# Patient Record
Sex: Female | Born: 1980 | Race: Black or African American | Hispanic: No | Marital: Single | Smoking: Current some day smoker
Health system: Southern US, Community
[De-identification: ages and names within clinical notes are randomized; demographics above are authoritative.]

## PROBLEM LIST (undated history)

## (undated) ENCOUNTER — Emergency Department (HOSPITAL_COMMUNITY): Discharge status: Absent without leave | Payer: Self-pay

## (undated) DIAGNOSIS — G629 Polyneuropathy, unspecified: Secondary | ICD-10-CM

## (undated) DIAGNOSIS — I2699 Other pulmonary embolism without acute cor pulmonale: Secondary | ICD-10-CM

## (undated) HISTORY — PX: TONSILLECTOMY: SUR1361

## (undated) HISTORY — PX: GASTRIC BYPASS: SHX52

---
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
## 2001-11-04 ENCOUNTER — Emergency Department (HOSPITAL_COMMUNITY): Admission: EM | Admit: 2001-11-04 | Discharge: 2001-11-04 | Payer: Self-pay | Admitting: Internal Medicine

## 2002-05-08 ENCOUNTER — Emergency Department (HOSPITAL_COMMUNITY): Admission: EM | Admit: 2002-05-08 | Discharge: 2002-05-08 | Payer: Self-pay | Admitting: Emergency Medicine

## 2006-01-22 ENCOUNTER — Emergency Department (HOSPITAL_COMMUNITY): Admission: EM | Admit: 2006-01-22 | Discharge: 2006-01-22 | Payer: Self-pay | Admitting: Emergency Medicine

## 2007-07-07 ENCOUNTER — Emergency Department (HOSPITAL_COMMUNITY): Admission: EM | Admit: 2007-07-07 | Discharge: 2007-07-08 | Payer: Self-pay | Admitting: Emergency Medicine

## 2008-02-05 ENCOUNTER — Emergency Department (HOSPITAL_COMMUNITY): Admission: EM | Admit: 2008-02-05 | Discharge: 2008-02-05 | Payer: Self-pay | Admitting: Emergency Medicine

## 2008-09-20 ENCOUNTER — Emergency Department (HOSPITAL_COMMUNITY): Admission: EM | Admit: 2008-09-20 | Discharge: 2008-09-20 | Payer: Self-pay | Admitting: Emergency Medicine

## 2009-02-12 ENCOUNTER — Emergency Department (HOSPITAL_COMMUNITY): Admission: EM | Admit: 2009-02-12 | Discharge: 2009-02-12 | Payer: Self-pay | Admitting: Emergency Medicine

## 2009-02-15 ENCOUNTER — Emergency Department (HOSPITAL_COMMUNITY): Admission: EM | Admit: 2009-02-15 | Discharge: 2009-02-15 | Payer: Self-pay | Admitting: Emergency Medicine

## 2009-05-12 ENCOUNTER — Emergency Department (HOSPITAL_COMMUNITY): Admission: EM | Admit: 2009-05-12 | Discharge: 2009-05-13 | Payer: Self-pay | Admitting: Emergency Medicine

## 2009-09-01 ENCOUNTER — Emergency Department (HOSPITAL_COMMUNITY): Admission: EM | Admit: 2009-09-01 | Discharge: 2009-09-01 | Payer: Self-pay | Admitting: Emergency Medicine

## 2009-12-11 ENCOUNTER — Emergency Department (HOSPITAL_COMMUNITY): Admission: EM | Admit: 2009-12-11 | Discharge: 2009-12-11 | Payer: Self-pay | Admitting: Emergency Medicine

## 2010-12-04 LAB — BASIC METABOLIC PANEL
Chloride: 105 mEq/L (ref 96–112)
GFR calc Af Amer: >60 mL/min (ref >60)
GFR calc non Af Amer: >60 mL/min (ref >60)
Potassium: 4.5 mEq/L (ref 3.5–5.1)
Sodium: 138 mEq/L (ref 135–145)

## 2010-12-04 LAB — CBC
RDW: 16.4 % — ABNORMAL HIGH (ref 11.5–15.5)
WBC: 8.8 10*3/uL (ref 4.0–10.5)

## 2010-12-04 LAB — URINALYSIS, ROUTINE W REFLEX MICROSCOPIC
Glucose, UA: NEGATIVE mg/dL
Ketones, ur: NEGATIVE mg/dL
Protein, ur: NEGATIVE mg/dL
pH: 5.5 (ref 5.0–8.0)

## 2010-12-04 LAB — DIFFERENTIAL
Basophils Absolute: 0 10*3/uL (ref 0.0–0.1)
Basophils Relative: 0 % (ref 0–1)
Eosinophils Absolute: 0.2 10*3/uL (ref 0.0–0.7)
Eosinophils Relative: 3 % (ref 0–5)
Monocytes Absolute: 0.6 10*3/uL (ref 0.1–1.0)
Monocytes Relative: 7 % (ref 3–12)
Neutrophils Relative %: 72 % (ref 43–77)

## 2011-12-23 ENCOUNTER — Encounter (HOSPITAL_COMMUNITY): Payer: Self-pay | Admitting: *Deleted

## 2011-12-23 ENCOUNTER — Emergency Department (HOSPITAL_COMMUNITY)
Admission: EM | Admit: 2011-12-23 | Discharge: 2011-12-23 | Disposition: A | Discharge status: Home / Self Care | Payer: PRIVATE HEALTH INSURANCE | Attending: Emergency Medicine | Admitting: Emergency Medicine

## 2011-12-23 DIAGNOSIS — R209 Unspecified disturbances of skin sensation: Secondary | ICD-10-CM | POA: Insufficient documentation

## 2011-12-23 DIAGNOSIS — R202 Paresthesia of skin: Secondary | ICD-10-CM

## 2011-12-23 MED ORDER — IBUPROFEN 800 MG PO TABS
800.0000 mg | ORAL_TABLET | Freq: Once | ORAL | Status: AC
  Administered 2011-12-23: 800 mg via ORAL
  Filled 2011-12-23: qty 1

## 2011-12-23 MED ORDER — HYDROCODONE-ACETAMINOPHEN 7.5-325 MG PO TABS: 1.0000 | ORAL_TABLET | ORAL | Status: AC | PRN

## 2011-12-23 MED ORDER — MELOXICAM 7.5 MG PO TABS: ORAL_TABLET | ORAL | Status: DC

## 2011-12-23 NOTE — ED Notes (Signed)
Lt thumb numb at tip and painful at times with radiation up arm. No known injury

## 2011-12-23 NOTE — ED Notes (Signed)
Pt DC to home with steady gait 

## 2011-12-23 NOTE — Discharge Instructions (Signed)
Please use the splint for the next 7 to 8 days. Use Mobic daily, take with food. Use Norco for pain if needed. This medication may cause drowsiness. Use with caution. Please see Dr Hilda Lias for additional evaluation if not improving.

## 2011-12-29 NOTE — ED Provider Notes (Signed)
History     CSN: [id number]  Arrival date & time 12/23/11  8295   First MD Initiated Contact with Patient 12/23/11 2024      Chief Complaint  Patient presents with  . Hand Pain    (Consider location/radiation/quality/duration/timing/severity/associated sxs/prior treatment) Patient is a 31 y.o. female presenting with hand pain. The history is provided by the patient.  Hand Pain This is a new problem. The current episode started in the past 7 days. The problem occurs daily. The problem has been gradually worsening. Pertinent negatives include no abdominal pain, arthralgias, chest pain, chills, coughing, fever or neck pain. Exacerbated by: movement. She has tried nothing for the symptoms. The treatment provided no relief.    History reviewed. No pertinent past medical history.  Past Surgical History  Procedure Date  . Tonsillectomy     History reviewed. No pertinent family history.  History  Substance Use Topics  . Smoking status: Never Smoker   . Smokeless tobacco: Not on file  . Alcohol Use: Yes    OB History    Grav Para Term Preterm Abortions TAB SAB Ect Mult Living                  Review of Systems  Constitutional: Negative for fever, chills and activity change.       All ROS Neg except as noted in HPI  HENT: Negative for nosebleeds and neck pain.   Eyes: Negative for photophobia and discharge.  Respiratory: Negative for cough, shortness of breath and wheezing.   Cardiovascular: Negative for chest pain and palpitations.  Gastrointestinal: Negative for abdominal pain and blood in stool.  Genitourinary: Negative for dysuria, frequency and hematuria.  Musculoskeletal: Negative for back pain and arthralgias.  Skin: Negative.   Neurological: Negative for dizziness, seizures and speech difficulty.  Psychiatric/Behavioral: Negative for hallucinations and confusion.    Allergies  Review of patient's allergies indicates no known allergies.  Home Medications    Current Outpatient Rx  Name Route Sig Dispense Refill  . CITALOPRAM HYDROBROMIDE 10 MG PO TABS Oral Take 10 mg by mouth daily.    Marland Kitchen FERROUS SULFATE 325 (65 FE) MG PO TABS Oral Take 325 mg by mouth daily.    Marland Kitchen HYDROCODONE-ACETAMINOPHEN 7.5-325 MG PO TABS Oral Take 1 tablet by mouth every 4 (four) hours as needed for pain. 20 tablet 0  . MELOXICAM 7.5 MG PO TABS  1 po bid with food 12 tablet 0    Take with food    BP 142/101  Pulse 82  Temp 98.8 F (37.1 C)  Resp 20  Ht 5\' 2"  (1.575 m)  Wt 245 lb (111.131 kg)  BMI 44.81 kg/m2  SpO2 100%  LMP 12/23/2011  Physical Exam  Nursing note and vitals reviewed. Constitutional: She is oriented to person, place, and time. She appears well-developed and well-nourished.  Non-toxic appearance.  HENT:  Head: Normocephalic.  Right Ear: Tympanic membrane and external ear normal.  Left Ear: Tympanic membrane and external ear normal.  Eyes: EOM and lids are normal. Pupils are equal, round, and reactive to light.  Neck: Normal range of motion. Neck supple. Carotid bruit is not present.  Cardiovascular: Normal rate, regular rhythm, normal heart sounds, intact distal pulses and normal pulses.   Pulmonary/Chest: Breath sounds normal. No respiratory distress.  Abdominal: Soft. Bowel sounds are normal. There is no tenderness. There is no guarding.  Musculoskeletal: Normal range of motion.       Pain with flex and ext.  Of the left  wrist and fingers, extending to the forearm. No hot joints. Neg Tenel's sign. No deficit in palmar arch filling.  Lymphadenopathy:       Head (right side): No submandibular adenopathy present.       Head (left side): No submandibular adenopathy present.    She has no cervical adenopathy.  Neurological: She is alert and oriented to person, place, and time. She has normal strength. No cranial nerve deficit or sensory deficit.  Skin: Skin is warm and dry.  Psychiatric: She has a normal mood and affect. Her speech is normal.     ED Course  Procedures (including critical care time)  Labs Reviewed - No data to display No results found.   1. Paresthesia of thumb of left hand       MDM  I have reviewed nursing notes, vital signs, and all appropriate lab and imaging results for this patient. Suspect tendonitis and or some form of nerve entrapment. Will treat with mobic and norco and obtain orthopedic referral.       Kathie Dike, PA 12/29/11 1005

## 2011-12-29 NOTE — ED Provider Notes (Signed)
Medical screening examination/treatment/procedure(s) were performed by non-physician practitioner and as supervising physician I was immediately available for consultation/collaboration.   Benny Lennert, MD 12/29/11 [phone]

## 2017-07-03 ENCOUNTER — Emergency Department (HOSPITAL_COMMUNITY): Payer: Self-pay

## 2017-07-03 ENCOUNTER — Emergency Department (HOSPITAL_COMMUNITY)
Admission: EM | Admit: 2017-07-03 | Discharge: 2017-07-03 | Disposition: A | Discharge status: Home / Self Care | Payer: Self-pay | Attending: Emergency Medicine | Admitting: Emergency Medicine

## 2017-07-03 ENCOUNTER — Encounter (HOSPITAL_COMMUNITY): Payer: Self-pay

## 2017-07-03 DIAGNOSIS — S29012A Strain of muscle and tendon of back wall of thorax, initial encounter: Secondary | ICD-10-CM | POA: Insufficient documentation

## 2017-07-03 DIAGNOSIS — S161XXA Strain of muscle, fascia and tendon at neck level, initial encounter: Secondary | ICD-10-CM | POA: Insufficient documentation

## 2017-07-03 DIAGNOSIS — F17200 Nicotine dependence, unspecified, uncomplicated: Secondary | ICD-10-CM | POA: Insufficient documentation

## 2017-07-03 DIAGNOSIS — Z79899 Other long term (current) drug therapy: Secondary | ICD-10-CM | POA: Insufficient documentation

## 2017-07-03 DIAGNOSIS — Y998 Other external cause status: Secondary | ICD-10-CM | POA: Insufficient documentation

## 2017-07-03 DIAGNOSIS — Y92410 Unspecified street and highway as the place of occurrence of the external cause: Secondary | ICD-10-CM | POA: Insufficient documentation

## 2017-07-03 DIAGNOSIS — Z9884 Bariatric surgery status: Secondary | ICD-10-CM | POA: Insufficient documentation

## 2017-07-03 DIAGNOSIS — Y9389 Activity, other specified: Secondary | ICD-10-CM | POA: Insufficient documentation

## 2017-07-03 MED ORDER — CYCLOBENZAPRINE HCL 10 MG PO TABS: 10.0000 mg | ORAL_TABLET | Freq: Three times a day (TID) | ORAL | 0 refills | Status: DC | PRN
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
## 2017-07-03 NOTE — Discharge Instructions (Signed)
Apply ice packs on/off to your upper back and neck.  Follow-up with your primary doctor for recheck  [facility]    Kari BaarsEdward Hawkins MD. Specialty: Pulmonary Disease Contact information: [address]  [address]  [place] [address]  [phone]   Syliva OvermanMargaret Simpson, MD. Specialty: [facility] Contact information: [address]  [place] [address]  [phone]   Lilyan PuntScott Luking, MD. Specialty: [facility] Contact information: [address]  [address]  [place] [address]  [phone]   Avon Gullyesfaye Fanta, MD Specialty: Internal Medicine Contact information: [address]  [place] [address]  [phone]   Catalina PizzaZach Hall, MD. Specialty: Internal Medicine Contact information: [address]  [place] [address]  [phone]    [facility] (Dr. Selena BattenKim) Specialty: Family Medicine Contact information: [address]  [place] [address]  [phone]   John GiovanniStephen Knowlton, MD. Specialty: [facility] Contact information: [address]  [address]  [place] [address]  [phone]   Carylon Perchesoy Fagan, MD. Specialty: Internal Medicine Contact information: [address]  [address]  [place] [address]  [phone]    [address] [facility]  [address] [place] [address] [phone]  Services [facility] offers a variety of basic health services.  Services include but are not limited to: Blood pressure checks  Heart rate checks  Blood sugar checks  Urine analysis  Rapid strep tests  Pregnancy tests.  Health education and referrals  People needing more complex services will be directed to a physician online. Using these virtual visits, doctors can evaluate and prescribe medicine and treatments. There will be no medication on-site, though [facility] will help patients fill their prescriptions at little to no  cost.   For More information please go to: [email or web address]
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
## 2017-07-03 NOTE — ED Notes (Signed)
Pt transported to Radiology 

## 2017-11-20 ENCOUNTER — Emergency Department (HOSPITAL_COMMUNITY): Payer: Self-pay

## 2017-11-20 ENCOUNTER — Encounter (HOSPITAL_COMMUNITY): Payer: Self-pay | Admitting: *Deleted

## 2017-11-20 ENCOUNTER — Emergency Department (HOSPITAL_COMMUNITY)
Admission: EM | Admit: 2017-11-20 | Discharge: 2017-11-20 | Disposition: A | Discharge status: Home / Self Care | Payer: Self-pay | Attending: Emergency Medicine | Admitting: Emergency Medicine

## 2017-11-20 ENCOUNTER — Other Ambulatory Visit: Payer: Self-pay

## 2017-11-20 DIAGNOSIS — R0789 Other chest pain: Secondary | ICD-10-CM

## 2017-11-20 DIAGNOSIS — E860 Dehydration: Secondary | ICD-10-CM

## 2017-11-20 DIAGNOSIS — F17210 Nicotine dependence, cigarettes, uncomplicated: Secondary | ICD-10-CM | POA: Insufficient documentation

## 2017-11-20 DIAGNOSIS — R079 Chest pain, unspecified: Secondary | ICD-10-CM | POA: Insufficient documentation

## 2017-11-20 LAB — D-DIMER, QUANTITATIVE: D-Dimer, Quant: 1.26 ug/mL-FEU — ABNORMAL HIGH (ref 0.00–0.50)

## 2017-11-20 LAB — BASIC METABOLIC PANEL
Anion gap: 13 (ref 5–15)
BUN: 8 mg/dL (ref 6–20)
CALCIUM: 9.5 mg/dL (ref 8.9–10.3)
CO2: 23 mmol/L (ref 22–32)
Chloride: 102 mmol/L (ref 101–111)
Creatinine, Ser: 0.7 mg/dL (ref 0.44–1.00)
GFR calc Af Amer: >60 mL/min (ref >60)
GLUCOSE: 100 mg/dL — AB (ref 65–99)
Potassium: 4.3 mmol/L (ref 3.5–5.1)
Sodium: 138 mmol/L (ref 135–145)

## 2017-11-20 LAB — TROPONIN I
Troponin I: <0.03 ng/mL (ref <0.03)
Troponin I: <0.03 ng/mL (ref <0.03)

## 2017-11-20 LAB — URINALYSIS, ROUTINE W REFLEX MICROSCOPIC
Bilirubin Urine: NEGATIVE
GLUCOSE, UA: NEGATIVE mg/dL
Ketones, ur: 20 mg/dL — AB
LEUKOCYTES UA: NEGATIVE
NITRITE: NEGATIVE
PH: 5 (ref 5.0–8.0)
PROTEIN: 30 mg/dL — AB
SPECIFIC GRAVITY, URINE: 1.023 (ref 1.005–1.030)

## 2017-11-20 LAB — CBC WITH DIFFERENTIAL/PLATELET
BASOS ABS: 0 10*3/uL (ref 0.0–0.1)
BASOS PCT: 0 %
EOS PCT: 1 %
Eosinophils Absolute: 0.1 10*3/uL (ref 0.0–0.7)
HCT: 39.6 % (ref 36.0–46.0)
Hemoglobin: 12.9 g/dL (ref 12.0–15.0)
LYMPHS PCT: 21 %
Lymphs Abs: 1.3 10*3/uL (ref 0.7–4.0)
MCH: 32.2 pg (ref 26.0–34.0)
MCHC: 32.6 g/dL (ref 30.0–36.0)
MCV: 98.8 fL (ref 78.0–100.0)
Monocytes Absolute: 0.4 10*3/uL (ref 0.1–1.0)
Monocytes Relative: 7 %
NEUTROS ABS: 4.4 10*3/uL (ref 1.7–7.7)
Neutrophils Relative %: 71 %
PLATELETS: 230 10*3/uL (ref 150–400)
RBC: 4.01 MIL/uL (ref 3.87–5.11)
RDW: 15 % (ref 11.5–15.5)
WBC: 6.2 10*3/uL (ref 4.0–10.5)

## 2017-11-20 LAB — PREGNANCY, URINE: Preg Test, Ur: NEGATIVE

## 2017-11-20 MED ORDER — IOPAMIDOL (ISOVUE-370) INJECTION 76%
100.0000 mL | Freq: Once | INTRAVENOUS | Status: AC | PRN
  Administered 2017-11-20: 100 mL via INTRAVENOUS

## 2017-11-20 MED ORDER — METHOCARBAMOL 500 MG PO TABS: 1000.0000 mg | ORAL_TABLET | Freq: Four times a day (QID) | ORAL | 0 refills | Status: DC | PRN

## 2017-11-20 MED ORDER — SODIUM CHLORIDE 0.9 % IV BOLUS
1000.0000 mL | Freq: Once | INTRAVENOUS | Status: AC
  Administered 2017-11-20: 1000 mL via INTRAVENOUS

## 2017-11-20 NOTE — ED Notes (Signed)
Patient transported to X-ray 

## 2017-11-20 NOTE — Discharge Instructions (Addendum)
Take your usual prescriptions as previously directed. Increase your fluid intake (ie: Gatorade) for the next few days.  Call your regular medical doctor tomorrow to schedule a follow up appointment within the next 3 days.  Return to the Emergency Department immediately sooner if worsening.

## 2017-11-20 NOTE — ED Triage Notes (Signed)
Pt c/o mid to left sided chest pain, shaking, feeling of passing out that started at 1100 today. Denies n/v, SOB.

## 2017-11-20 NOTE — ED Notes (Signed)
Gave EKG to Dr. McManus  

## 2017-11-20 NOTE — ED Provider Notes (Signed)
[facility]ENCY [facility]PARTMENT Provider Note   CSN: [id number] Arrival date & time: 11/20/17  1304     History   Chief Complaint Chief Complaint  Patient presents with  . Chest Pain    HPI [name] is a 37 y.o. female.  HPI  Pt was seen at 1340. Per pt, c/o gradual onset and persistence of constant multiple symptoms that began while walking at work approximately 11am. Pt states she works in [employer]. Pt's symptoms include: "shaking," "feeling like I'm going to pass out," left upper "sharp" chest pains. Denies abd pain, no N/V/D, no palpitations, no SOB/cough, no focal motor weakness, no tingling/numbness in extremities.    History reviewed. No pertinent past medical history.  There are no active problems to display for this patient.   Past Surgical History:  Procedure Laterality Date  . GASTRIC BYPASS    . TONSILLECTOMY       OB History   None      Home Medications    Prior to Admission medications   Medication Sig Start Date End Date Taking? Authorizing Provider  cyclobenzaprine (FLEXERIL) 10 MG tablet Take 1 tablet (10 mg total) 3 (three) times daily as needed by mouth. 07/03/17   Triplett, Tammy, PA-C  Multiple Vitamins-Minerals (ALIVE ONCE DAILY WOMENS PO) Take 1 tablet daily by mouth.    [provider]    Family History No family history on file.  Social History Social History   Tobacco Use  . Smoking status: Current Every Day Smoker  . Smokeless tobacco: Never Used  Substance Use Topics  . Alcohol use: Yes    Comment: occ  . Drug use: No     Allergies   Ibuprofen   Review of Systems Review of Systems ROS: Statement: All systems negative except as marked or noted in the HPI; Constitutional: Negative for fever and chills. +"feeling shaky."; ; Eyes: Negative for eye pain, redness and discharge. ; ; ENMT: Negative for ear pain, hoarseness, nasal congestion, sinus pressure and sore throat. ; ; Cardiovascular: +CP.  Negative for palpitations, diaphoresis, dyspnea and peripheral edema. ; ; Respiratory: Negative for cough, wheezing and stridor. ; ; Gastrointestinal: Negative for nausea, vomiting, diarrhea, abdominal pain, blood in stool, hematemesis, jaundice and rectal bleeding. . ; ; Genitourinary: Negative for dysuria, flank pain and hematuria. ; ; Musculoskeletal: Negative for back pain and neck pain. Negative for swelling and trauma.; ; Skin: Negative for pruritus, rash, abrasions, blisters, bruising and skin lesion.; ; Neuro: +lightheadedness. Negative for headache and neck stiffness. Negative for weakness, altered level of consciousness, altered mental status, extremity weakness, paresthesias, involuntary movement, seizure and syncope.      Physical Exam Updated Vital Signs BP (!) 156/101 (BP Location: Right Arm)   Pulse (!) 124   Temp 98.4 F (36.9 C) (Oral)   Resp 16   Ht 5\' 2"  (1.575 m)   Wt 84.4 kg (186 lb)   SpO2 100%   BMI 34.02 kg/m   14:42:50 Orthostatic Vital Signs RM  Orthostatic Lying   BP- Lying: 146/95Abnormal    Pulse- Lying: 89       Orthostatic Sitting  BP- Sitting: 155/68   Pulse- Sitting: 88       Orthostatic Standing at 0 minutes  BP- Standing at 0 minutes: 141/99Abnormal    Pulse- Standing at 0 minutes: 88    Patient Vitals for the past 24 hrs:  BP Temp Temp src Pulse Resp SpO2 Height Weight  11/20/17 1830 134/78 - -  77 20 99 % - -  11/20/17 1815 - - - 83 18 100 % - -  11/20/17 1800 120/87 - - 83 20 100 % - -  11/20/17 1730 123/78 - - 85 17 100 % - -  11/20/17 1700 108/63 - - 77 16 100 % - -  11/20/17 1630 126/87 - - 74 18 100 % - -  11/20/17 1615 - - - 81 20 100 % - -  11/20/17 1605 126/78 - - 88 18 99 % - -  11/20/17 1530 - - - - 16 - - -  11/20/17 1445 (!) 141/99 - - 84 15 100 % - -  11/20/17 1430 (!) 148/90 - - 96 19 100 % - -  11/20/17 1400 (!) 134/91 - - (!) 110 (!) 21 100 % - -  11/20/17 1330 (!) 143/94 - - 99 15 100 % - -  11/20/17 1309 (!) 156/101  98.4 F (36.9 C) Oral (!) 124 16 100 % - -  11/20/17 1308 - - - - - - 5\' 2"  (1.575 m) 84.4 kg (186 lb)    Physical Exam 1345: Physical examination:  Nursing notes reviewed; Vital signs and O2 SAT reviewed;  Constitutional: Well developed, Well nourished, Well hydrated, In no acute distress; Head:  Normocephalic, atraumatic; Eyes: EOMI, PERRL, No scleral icterus; ENMT: TM's clear bilat. +edemetous nasal turbinates bilat with clear rhinorrhea. Mouth and pharynx normal, Mucous membranes moist; Neck: Supple, Full range of motion, No lymphadenopathy; Cardiovascular: Tachycardic rate and rhythm, No gallop; Respiratory: Breath sounds clear & equal bilaterally, No wheezes.  Speaking full sentences with ease, Normal respiratory effort/excursion; Chest: No deformity, no soft tissue crepitus. Movement normal; Abdomen: Soft, Nontender, Nondistended, Normal bowel sounds; Genitourinary: No CVA tenderness; Extremities: Peripheral pulses normal, No tenderness, No edema, No calf edema or asymmetry.; Neuro: AA&Ox3, Major CN grossly intact. No facial droop. Speech clear. No gross focal motor or sensory deficits in extremities.; Skin: Color normal, Warm, Dry.   ED Treatments / Results  Labs (all labs ordered are listed, but only abnormal results are displayed)   EKG EKG Interpretation  Date/Time:  Thursday November 20 2017 13:10:22 EDT Ventricular Rate:  120 PR Interval:  154 QRS Duration: 62 QT Interval:  316 QTC Calculation: 446 R Axis:   92 Text Interpretation:  Sinus tachycardia Rightward axis Nonspecific T wave abnormality No old tracing to compare Confirmed by Samuel Jester [phone]) on 11/20/2017 1:48:22 PM   Radiology   Procedures Procedures (including critical care time)  Medications Ordered in ED Medications  sodium chloride 0.9 % bolus 1,000 mL (1,000 mLs Intravenous New Bag/Given 11/20/17 1349)     Initial Impression / Assessment and Plan / ED Course  I have reviewed the triage vital  signs and the nursing notes.  Pertinent labs & imaging results that were available during my care of the patient were reviewed by me and considered in my medical decision making (see chart for details).  MDM Reviewed: previous chart, nursing note and vitals Reviewed previous: labs and ECG Interpretation: labs, ECG and x-ray   Results for orders placed or performed during the hospital encounter of 11/20/17  Basic metabolic panel  Result Value Ref Range   Sodium 138 135 - 145 mmol/L   Potassium 4.3 3.5 - 5.1 mmol/L   Chloride 102 101 - 111 mmol/L   CO2 23 22 - 32 mmol/L   Glucose, Bld 100 (H) 65 - 99 mg/dL   BUN 8 6 - 20 mg/dL  Creatinine, Ser 0.70 0.44 - 1.00 mg/dL   Calcium 9.5 8.9 - 16.1 mg/dL   GFR calc non Af Amer >60 >60 mL/min   GFR calc Af Amer >60 >60 mL/min   Anion gap 13 5 - 15  Troponin I  Result Value Ref Range   Troponin I <0.03 <0.03 ng/mL  Pregnancy, urine  Result Value Ref Range   Preg Test, Ur NEGATIVE NEGATIVE  CBC with Differential  Result Value Ref Range   WBC 6.2 4.0 - 10.5 K/uL   RBC 4.01 3.87 - 5.11 MIL/uL   Hemoglobin 12.9 12.0 - 15.0 g/dL   HCT 09.6 04.5 - 40.9 %   MCV 98.8 78.0 - 100.0 fL   MCH 32.2 26.0 - 34.0 pg   MCHC 32.6 30.0 - 36.0 g/dL   RDW 81.1 91.4 - 78.2 %   Platelets 230 150 - 400 K/uL   Neutrophils Relative % 71 %   Neutro Abs 4.4 1.7 - 7.7 K/uL   Lymphocytes Relative 21 %   Lymphs Abs 1.3 0.7 - 4.0 K/uL   Monocytes Relative 7 %   Monocytes Absolute 0.4 0.1 - 1.0 K/uL   Eosinophils Relative 1 %   Eosinophils Absolute 0.1 0.0 - 0.7 K/uL   Basophils Relative 0 %   Basophils Absolute 0.0 0.0 - 0.1 K/uL  D-dimer, quantitative  Result Value Ref Range   D-Dimer, Quant 1.26 (H) 0.00 - 0.50 ug/mL-FEU  Urinalysis, Routine w reflex microscopic  Result Value Ref Range   Color, Urine AMBER (A) YELLOW   APPearance HAZY (A) CLEAR   Specific Gravity, Urine 1.023 1.005 - 1.030   pH 5.0 5.0 - 8.0   Glucose, UA NEGATIVE NEGATIVE mg/dL     Hgb urine dipstick SMALL (A) NEGATIVE   Bilirubin Urine NEGATIVE NEGATIVE   Ketones, ur 20 (A) NEGATIVE mg/dL   Protein, ur 30 (A) NEGATIVE mg/dL   Nitrite NEGATIVE NEGATIVE   Leukocytes, UA NEGATIVE NEGATIVE   RBC / HPF 0-5 0 - 5 RBC/hpf   WBC, UA 0-5 0 - 5 WBC/hpf   Bacteria, UA RARE (A) NONE SEEN   Squamous Epithelial / LPF 0-5 (A) NONE SEEN   Mucus PRESENT    Hyaline Casts, UA PRESENT   Troponin I  Result Value Ref Range   Troponin I <0.03 <0.03 ng/mL   Dg Chest 2 View Result Date: 11/20/2017 CLINICAL DATA:  Acute onset left-sided chest pain this morning. EXAM: CHEST - 2 VIEW COMPARISON:  None. FINDINGS: The heart size and mediastinal contours are within normal limits. Both lungs are clear. No evidence of pneumothorax or pleural effusion. The visualized skeletal structures are unremarkable. IMPRESSION: Negative.  No active cardiopulmonary disease. Electronically Signed   By: Myles Rosenthal M.D.   On: 11/20/2017 14:59   Ct Angio Chest Pe W/cm &/or Wo Cm Result Date: 11/20/2017 CLINICAL DATA:  Left-sided chest pain for 2 hours EXAM: CT ANGIOGRAPHY CHEST WITH CONTRAST TECHNIQUE: Multidetector CT imaging of the chest was performed using the standard protocol during bolus administration of intravenous contrast. Multiplanar CT image reconstructions and MIPs were obtained to evaluate the vascular anatomy. CONTRAST:  ISOVUE-370 IOPAMIDOL (ISOVUE-370) INJECTION 76% COMPARISON:  None. FINDINGS: Cardiovascular: Satisfactory opacification of the pulmonary arteries to the segmental level. No evidence of pulmonary embolism. Normal heart size. No pericardial effusion. Normal caliber thoracic aorta. No thoracic aortic dissection. Mediastinum/Nodes: No enlarged mediastinal, hilar, or axillary lymph nodes. Thyroid gland, trachea, and esophagus demonstrate no significant findings. Lungs/Pleura: Lungs are  clear. No pleural effusion or pneumothorax. Upper Abdomen: No acute upper abdominal abnormality. Diffuse  low attenuation of the liver as can be seen with hepatic steatosis. Prior gastric bypass. Musculoskeletal: No chest wall abnormality. No acute or significant osseous findings. Review of the MIP images confirms the above findings. IMPRESSION: 1. No evidence of a pulmonary embolus. Electronically Signed   By: Elige Ko   On: 11/20/2017 15:13    1845:  IVF given with improvement in mild tachycardia. Not orthostatic on VS. Pt has tol PO well while in the ED without N/V.  No stooling while in the ED.  Abd remains benign, resps easy, VSS. Feels better and wants to go home now. Doubt PE as cause for symptoms with negative CT-A chest and low risk Wells.  Doubt ACS as cause for symptoms with normal troponin x2 and EKG without acute STTW changes after 6 hours of constant symptoms. Tx symptomatically at this time. Dx and testing d/w pt and family.  Questions answered.  Verb understanding, agreeable to d/c home with outpt f/u.     Final Clinical Impressions(s) / ED Diagnoses   Final diagnoses:  None    ED Discharge Orders    None       Samuel Jester, DO 11/24/17 1610

## 2018-03-30 ENCOUNTER — Emergency Department (HOSPITAL_COMMUNITY)
Admission: EM | Admit: 2018-03-30 | Discharge: 2018-03-31 | Disposition: A | Discharge status: Home / Self Care | Payer: Medicaid Other | Attending: Emergency Medicine | Admitting: Emergency Medicine

## 2018-03-30 ENCOUNTER — Encounter (HOSPITAL_COMMUNITY): Payer: Self-pay | Admitting: Emergency Medicine

## 2018-03-30 ENCOUNTER — Other Ambulatory Visit: Payer: Self-pay

## 2018-03-30 DIAGNOSIS — R51 Headache: Secondary | ICD-10-CM | POA: Diagnosis present

## 2018-03-30 DIAGNOSIS — F17200 Nicotine dependence, unspecified, uncomplicated: Secondary | ICD-10-CM | POA: Insufficient documentation

## 2018-03-30 DIAGNOSIS — G44221 Chronic tension-type headache, intractable: Secondary | ICD-10-CM | POA: Diagnosis not present

## 2018-03-30 DIAGNOSIS — Z79899 Other long term (current) drug therapy: Secondary | ICD-10-CM | POA: Diagnosis not present

## 2018-03-30 DIAGNOSIS — R519 Headache, unspecified: Secondary | ICD-10-CM

## 2018-03-30 MED ORDER — DEXAMETHASONE SODIUM PHOSPHATE 4 MG/ML IJ SOLN
10.0000 mg | Freq: Once | INTRAMUSCULAR | Status: AC
  Administered 2018-03-31: 10 mg via INTRAVENOUS
  Filled 2018-03-30: qty 3

## 2018-03-30 MED ORDER — METOCLOPRAMIDE HCL 5 MG/ML IJ SOLN
10.0000 mg | Freq: Once | INTRAMUSCULAR | Status: AC
  Administered 2018-03-31: 10 mg via INTRAVENOUS
  Filled 2018-03-30: qty 2

## 2018-03-30 MED ORDER — DIPHENHYDRAMINE HCL 25 MG PO CAPS
25.0000 mg | ORAL_CAPSULE | Freq: Once | ORAL | Status: AC
  Administered 2018-03-31: 25 mg via ORAL
  Filled 2018-03-30: qty 1

## 2018-03-30 NOTE — ED Provider Notes (Signed)
[facility]   CSN: [id number] Arrival date & time: 03/30/18  2157     History   Chief Complaint Chief Complaint  Patient presents with  . Headache    HPI Stacy FosterLititia J Damian is a 37 y.o. female.  HPI   Stacy [name] is a 37 y.o. female who presents to the Emergency Department complaining of frontal headache that began at 8:30 PM this evening.  She states she woke up from sleep and noticed a throbbing sensation to the front of her head that was associated with "ringing in my head and ears" she reports history of previous headaches, but not associated with a ringing sensation.  She has not taken any medication prior to arrival.  Her symptoms are associated with nausea but no vomiting.  She also denies chest pain, shortness of breath, pain or stiffness of her neck, visual changes, facial weakness, hearing loss or dizziness. She states that her blood pressure has been elevated recently and she is concerned her symptoms are associated with this.  No previous history of hypertension.   History reviewed. No pertinent past medical history.  There are no active problems to display for this patient.   Past Surgical History:  Procedure Laterality Date  . GASTRIC BYPASS    . TONSILLECTOMY       OB History   None      Home Medications    Prior to Admission medications   Medication Sig Start Date End Date Taking? Authorizing Provider  Multiple Vitamins-Minerals (ALIVE ONCE DAILY WOMENS PO) Take 1 tablet daily by mouth.    [provider]    Family History History reviewed. No pertinent family history.  Social History Social History   Tobacco Use  . Smoking status: Current Every Day Smoker  . Smokeless tobacco: Never Used  Substance Use Topics  . Alcohol use: Yes    Comment: occ  . Drug use: No     Allergies   Ibuprofen   Review of Systems Review of Systems  Constitutional: Negative for activity change, appetite  change and fever.  HENT: Negative for facial swelling and trouble swallowing.   Eyes: Positive for photophobia. Negative for pain and visual disturbance.  Respiratory: Negative for shortness of breath.   Cardiovascular: Negative for chest pain.  Gastrointestinal: Positive for nausea. Negative for abdominal pain and vomiting.  Genitourinary: Negative for dysuria and flank pain.  Musculoskeletal: Negative for neck pain and neck stiffness.  Skin: Negative for rash and wound.  Neurological: Positive for headaches. Negative for dizziness, facial asymmetry, speech difficulty, weakness and numbness.  Psychiatric/Behavioral: Negative for confusion and decreased concentration.     Physical Exam Updated Vital Signs BP 123/73   Pulse 71   Temp 98.2 F (36.8 C)   Resp 17   Ht 5\' 2"  (1.575 m)   Wt 85.7 kg   SpO2 (!) 18%   BMI 34.57 kg/m   Physical Exam  Constitutional: She is oriented to person, place, and time. She appears well-developed and well-nourished. No distress.  HENT:  Head: Normocephalic and atraumatic.  Mouth/Throat: Oropharynx is clear and moist.  Eyes: Pupils are equal, round, and reactive to light. Conjunctivae and EOM are normal.  Neck: Normal range of motion and phonation normal. Neck supple. No spinous process tenderness and no muscular tenderness present. No neck rigidity. No Kernig's sign noted.  Cardiovascular: Normal rate, regular rhythm and intact distal pulses.  Pulmonary/Chest: Effort normal and breath sounds normal. No respiratory distress.  Musculoskeletal: Normal range of motion.  Neurological: She is alert and oriented to person, place, and time. She has normal strength. No cranial nerve deficit or sensory deficit. She exhibits normal muscle tone. Coordination and gait normal. GCS eye subscore is 4. GCS verbal subscore is 5. GCS motor subscore is 6.  Reflex Scores:      Tricep reflexes are 2+ on the right side and 2+ on the left side.      Bicep reflexes are 2+  on the right side and 2+ on the left side. CN III-XII grossly intact.  speech clear.  No pronator drift.  Normal finger-nose testing.  Skin: Skin is warm and dry. Capillary refill takes less than 2 seconds. No rash noted.  Psychiatric: She has a normal mood and affect. Thought content normal.  Nursing note and vitals reviewed.    ED Treatments / Results  Labs (all labs ordered are listed, but only abnormal results are displayed) Labs Reviewed - No data to display  EKG None  Radiology No results found.  Procedures Procedures (including critical care time)  Medications Ordered in ED Medications  metoCLOPramide (REGLAN) injection 10 mg (has no administration in time range)  diphenhydrAMINE (BENADRYL) capsule 25 mg (has no administration in time range)  dexamethasone (DECADRON) injection 10 mg (has no administration in time range)     Initial Impression / Assessment and Plan / ED Course  I have reviewed the triage vital signs and the nursing notes.  Pertinent labs & imaging results that were available during my care of the patient were reviewed by me and considered in my medical decision making (see chart for details).     Patient well-appearing.  No focal neuro deficits on exam.  No nuchal rigidity.  Nontoxic-appearing.  0110 on recheck, patient is resting comfortably, watching TV.  States headache is improved and she is ready for discharge home.  Vitals reassuring.  No concerning symptoms for emergent neurological or intracranial process.  Final Clinical Impressions(s) / ED Diagnoses   Final diagnoses:  Headache disorder    ED Discharge Orders    None       Pauline Ausriplett, Surie Suchocki, PA-C 03/31/18 0126    Geoffery Lyonselo, Douglas, MD 03/31/18 [phone]

## 2018-03-30 NOTE — ED Triage Notes (Signed)
Pt states she woke up with headache and ringing in her head.

## 2018-03-31 MED ORDER — BUTALBITAL-APAP-CAFFEINE 50-325-40 MG PO TABS: 1.0000 | ORAL_TABLET | ORAL | 0 refills | Status: DC | PRN

## 2018-03-31 NOTE — ED Notes (Signed)
Pt's oxygen sat was 98% not 18%.

## 2018-03-31 NOTE — Discharge Instructions (Addendum)
Follow-up with your primary doctor for recheck or return to the ER for any worsening symptoms.

## 2018-04-06 ENCOUNTER — Encounter (HOSPITAL_COMMUNITY): Payer: Self-pay | Admitting: Emergency Medicine

## 2018-04-06 ENCOUNTER — Emergency Department (HOSPITAL_COMMUNITY)
Admission: EM | Admit: 2018-04-06 | Discharge: 2018-04-07 | Disposition: A | Discharge status: Home / Self Care | Payer: Medicaid Other | Attending: Emergency Medicine | Admitting: Emergency Medicine

## 2018-04-06 ENCOUNTER — Emergency Department (HOSPITAL_COMMUNITY): Payer: Medicaid Other

## 2018-04-06 ENCOUNTER — Other Ambulatory Visit: Payer: Self-pay

## 2018-04-06 DIAGNOSIS — H538 Other visual disturbances: Secondary | ICD-10-CM | POA: Diagnosis not present

## 2018-04-06 DIAGNOSIS — R51 Headache: Secondary | ICD-10-CM | POA: Insufficient documentation

## 2018-04-06 DIAGNOSIS — Z79899 Other long term (current) drug therapy: Secondary | ICD-10-CM | POA: Insufficient documentation

## 2018-04-06 DIAGNOSIS — F17200 Nicotine dependence, unspecified, uncomplicated: Secondary | ICD-10-CM | POA: Insufficient documentation

## 2018-04-06 DIAGNOSIS — H9313 Tinnitus, bilateral: Secondary | ICD-10-CM | POA: Insufficient documentation

## 2018-04-06 DIAGNOSIS — R42 Dizziness and giddiness: Secondary | ICD-10-CM | POA: Diagnosis present

## 2018-04-06 NOTE — ED Provider Notes (Signed)
[facility]   CSN: [id number] Arrival date & time: 04/06/18  2120     History   Chief Complaint Chief Complaint  Patient presents with  . Headache    HPI [name] is a 37 y.o. female.  Patient was seen on 8 /12/19 for headache that was associated with ringing in her ears. Headache was treated successfully, but ringing in her ears has persisted and worsened over the last several days. No current headache. States she is occasionally seeing spots and having dizzy spells.  The history is provided by the patient and medical records. No language interpreter was used.  Headache   The patient is experiencing no pain. Pertinent negatives include no fever.    History reviewed. No pertinent past medical history.  There are no active problems to display for this patient.   Past Surgical History:  Procedure Laterality Date  . GASTRIC BYPASS    . TONSILLECTOMY       OB History   None      Home Medications    Prior to Admission medications   Medication Sig Start Date End Date Taking? Authorizing Provider  butalbital-acetaminophen-caffeine (FIORICET, ESGIC) [phone] MG tablet Take 1 tablet by mouth every 4 (four) hours as needed for headache. 03/31/18 03/31/19 Yes Triplett, Tammy, PA-C  Multiple Vitamins-Minerals (ALIVE ONCE DAILY WOMENS PO) Take 1 tablet daily by mouth.   Yes [provider]    Family History History reviewed. No pertinent family history.  Social History Social History   Tobacco Use  . Smoking status: Current Every Day Smoker  . Smokeless tobacco: Never Used  Substance Use Topics  . Alcohol use: Yes    Comment: occ  . Drug use: No     Allergies   Ibuprofen   Review of Systems Review of Systems  Constitutional: Negative for chills, fatigue and fever.  Eyes: Positive for visual disturbance. Negative for photophobia.  Musculoskeletal: Negative for gait problem.  Neurological: Positive for  dizziness and headaches.  All other systems reviewed and are negative.    Physical Exam Updated Vital Signs BP (!) 146/86 (BP Location: Left Arm)   Pulse 70   Temp 98.2 F (36.8 C) (Oral)   Resp 18   Ht 5\' 2"  (1.575 m)   Wt 85 kg   SpO2 99%   BMI 34.27 kg/m   Physical Exam  Constitutional: She is oriented to person, place, and time. She appears well-developed and well-nourished. She does not appear ill.  HENT:  Head: Normocephalic and atraumatic.  Eyes: Pupils are equal, round, and reactive to light. Right eye exhibits nystagmus. Left eye exhibits nystagmus.  Neck: Neck supple.  Cardiovascular: Normal rate and regular rhythm.  Pulmonary/Chest: Effort normal and breath sounds normal.  Abdominal: Soft. Bowel sounds are normal.  Musculoskeletal: Normal range of motion.  Neurological: She is alert and oriented to person, place, and time. She has normal strength. No cranial nerve deficit or sensory deficit. GCS eye subscore is 4. GCS verbal subscore is 5. GCS motor subscore is 6.  Skin: Skin is warm and dry.  Nursing note and vitals reviewed.    ED Treatments / Results  Labs (all labs ordered are listed, but only abnormal results are displayed) Labs Reviewed - No data to display  EKG None  Radiology Ct Head Wo Contrast  Result Date: 04/07/2018 CLINICAL DATA:  Initial evaluation for acute headache, ringing in ears, dizziness. EXAM: CT HEAD WITHOUT CONTRAST TECHNIQUE: Contiguous axial images  were obtained from the base of the skull through the vertex without intravenous contrast. COMPARISON:  None. FINDINGS: Brain: Cerebral volume within normal limits for patient age. No evidence for acute intracranial hemorrhage. No findings to suggest acute large vessel territory infarct. No mass lesion, midline shift, or mass effect. Ventricles are normal in size without evidence for hydrocephalus. No extra-axial fluid collection identified. Vascular: No hyperdense vessel identified. Skull:  Scalp soft tissues demonstrate no acute abnormality. Calvarium intact. Sinuses/Orbits: Globes and orbital soft tissues within normal limits. Visualized paranasal sinuses are clear. No mastoid effusion. IMPRESSION: Negative head CT.  No acute intracranial abnormality. Electronically Signed   By: Rise MuBenjamin  McClintock M.D.   On: 04/07/2018 00:15    Procedures Procedures (including critical care time)  Medications Ordered in ED Medications - No data to display   Initial Impression / Assessment and Plan / ED Course  I have reviewed the triage vital signs and the nursing notes.  Pertinent labs & imaging results that were available during my care of the patient were reviewed by me and considered in my medical decision making (see chart for details).     Patient with tinnitus. Ear exam normal, normal TM's without erythema, bulging. Normal bony structures visualized. CT of head without acute abnormality. Will trial low dose xanax. Patient will need to follow-up with her PCP for additional audiology assessment.  Final Clinical Impressions(s) / ED Diagnoses   Final diagnoses:  Tinnitus of both ears    ED Discharge Orders         Ordered    ALPRAZolam (XANAX) 0.25 MG tablet  2 times daily PRN     04/07/18 0112           Felicie MornSmith, Jencarlos Nicolson, NP 04/07/18 0142    Donnetta Hutchingook, Brian, MD 04/07/18 [phone]

## 2018-04-06 NOTE — ED Notes (Signed)
Pt states she wouldn't say she is "in pain" but that she is having "ringing in her head"

## 2018-04-06 NOTE — ED Triage Notes (Signed)
Pt states was here 03/30/18 for same, pt c/o headache, ringing in ears, spotty vision and dizziness when standing x 1 week

## 2018-04-07 MED ORDER — ALPRAZOLAM 0.25 MG PO TABS: 0.2500 mg | ORAL_TABLET | Freq: Two times a day (BID) | ORAL | 0 refills | Status: DC | PRN

## 2018-04-29 ENCOUNTER — Encounter (HOSPITAL_COMMUNITY): Payer: Self-pay | Admitting: Emergency Medicine

## 2018-04-29 ENCOUNTER — Telehealth: Payer: Self-pay | Admitting: Orthopedic Surgery

## 2018-04-29 ENCOUNTER — Other Ambulatory Visit: Payer: Self-pay

## 2018-04-29 ENCOUNTER — Emergency Department (HOSPITAL_COMMUNITY): Payer: Medicaid Other

## 2018-04-29 ENCOUNTER — Emergency Department (HOSPITAL_COMMUNITY)
Admission: EM | Admit: 2018-04-29 | Discharge: 2018-04-29 | Disposition: A | Discharge status: Home / Self Care | Payer: Medicaid Other | Attending: Emergency Medicine | Admitting: Emergency Medicine

## 2018-04-29 DIAGNOSIS — Y939 Activity, unspecified: Secondary | ICD-10-CM | POA: Diagnosis not present

## 2018-04-29 DIAGNOSIS — S4991XA Unspecified injury of right shoulder and upper arm, initial encounter: Secondary | ICD-10-CM | POA: Diagnosis present

## 2018-04-29 DIAGNOSIS — Y999 Unspecified external cause status: Secondary | ICD-10-CM | POA: Diagnosis not present

## 2018-04-29 DIAGNOSIS — F17210 Nicotine dependence, cigarettes, uncomplicated: Secondary | ICD-10-CM | POA: Insufficient documentation

## 2018-04-29 DIAGNOSIS — Y929 Unspecified place or not applicable: Secondary | ICD-10-CM | POA: Insufficient documentation

## 2018-04-29 DIAGNOSIS — S42292A Other displaced fracture of upper end of left humerus, initial encounter for closed fracture: Secondary | ICD-10-CM | POA: Insufficient documentation

## 2018-04-29 DIAGNOSIS — Z79899 Other long term (current) drug therapy: Secondary | ICD-10-CM | POA: Insufficient documentation

## 2018-04-29 DIAGNOSIS — W2209XA Striking against other stationary object, initial encounter: Secondary | ICD-10-CM | POA: Insufficient documentation

## 2018-04-29 MED ORDER — HYDROCODONE-ACETAMINOPHEN 5-325 MG PO TABS
2.0000 | ORAL_TABLET | Freq: Once | ORAL | Status: AC
  Administered 2018-04-29: 2 via ORAL
  Filled 2018-04-29: qty 2

## 2018-04-29 MED ORDER — OXYCODONE-ACETAMINOPHEN 5-325 MG PO TABS: 2.0000 | ORAL_TABLET | ORAL | 0 refills | Status: DC | PRN

## 2018-04-29 NOTE — ED Triage Notes (Addendum)
Pt reports fell up against side rail this am and reports left arm pain ever since. No obvious deformity noted. Pt denies loc or hitting head.

## 2018-04-29 NOTE — Telephone Encounter (Signed)
Patient came by this afternoon stating she went to ER for arm fracture.  She said that she did have Medicaid.  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 She said she hadnt been long moved here.  I looked at her insurance and  it had a facility out of [place].  She said she didn't think she had even been there before.  I told her that she would need to speak with someone at social services asap to see how she needs to proceed.    She said she would do this and give me a call tomorrow.

## 2018-04-30 NOTE — ED Provider Notes (Signed)
[facility] Provider Note   CSN: [id number] Arrival date & time: 04/29/18  1148     History   Chief Complaint Chief Complaint  Patient presents with  . Fall    HPI Stacy [name] is a 37 y.o. female.  The history is provided by the patient. No language interpreter was used.  Fall  This is a new problem. The current episode started 1 to 2 hours ago. The problem occurs constantly. The problem has not changed since onset.Pertinent negatives include no chest pain and no abdominal pain. Nothing aggravates the symptoms. Nothing relieves the symptoms. She has tried nothing for the symptoms. The treatment provided no relief.    History reviewed. No pertinent past medical history.  There are no active problems to display for this patient.   Past Surgical History:  Procedure Laterality Date  . GASTRIC BYPASS    . TONSILLECTOMY       OB History   None      Home Medications    Prior to Admission medications   Medication Sig Start Date End Date Taking? Authorizing Provider  ALPRAZolam (XANAX) 0.25 MG tablet Take 1 tablet (0.25 mg total) by mouth 2 (two) times daily as needed. 04/07/18   Felicie MornSmith, David, NP  butalbital-acetaminophen-caffeine (FIORICET, ESGIC) [phone] MG tablet Take 1 tablet by mouth every 4 (four) hours as needed for headache. 03/31/18 03/31/19  Triplett, Tammy, PA-C  Multiple Vitamins-Minerals (ALIVE ONCE DAILY WOMENS PO) Take 1 tablet daily by mouth.    [provider]  oxyCODONE-acetaminophen (PERCOCET/ROXICET) 5-325 MG tablet Take 2 tablets by mouth every 4 (four) hours as needed for severe pain. 04/29/18   Elson AreasSofia, Meyer Arora K, PA-C    Family History History reviewed. No pertinent family history.  Social History Social History   Tobacco Use  . Smoking status: Light Tobacco Smoker  . Smokeless tobacco: Never Used  Substance Use Topics  . Alcohol use: Yes    Comment: occ  . Drug use: No     Allergies    Ibuprofen   Review of Systems Review of Systems  Cardiovascular: Negative for chest pain.  Gastrointestinal: Negative for abdominal pain.  All other systems reviewed and are negative.    Physical Exam Updated Vital Signs BP 119/74 (BP Location: Right Arm)   Pulse 84   Temp 98.8 F (37.1 C) (Oral)   Resp 16   Ht 5\' 2"  (1.575 m)   Wt 87.1 kg   SpO2 99%   BMI 35.12 kg/m   Physical Exam  Constitutional: She appears well-developed and well-nourished.  HENT:  Head: Normocephalic.  Eyes: Pupils are equal, round, and reactive to light.  Cardiovascular: Normal rate.  Pulmonary/Chest: Effort normal.  Musculoskeletal: She exhibits tenderness and deformity.  Swollen tender left elbow,  Pain with range of motion,  nv and  ns intact   Neurological: She is alert.  Skin: Skin is warm.  Psychiatric: She has a normal mood and affect.  Nursing note and vitals reviewed.    ED Treatments / Results  Labs (all labs ordered are listed, but only abnormal results are displayed) Labs Reviewed - No data to display  EKG None  Radiology Dg Elbow Complete Left  Result Date: 04/29/2018 CLINICAL DATA:  Fall downstairs with elbow pain, initial encounter EXAM: LEFT ELBOW - COMPLETE 3+ VIEW COMPARISON:  None. FINDINGS: The proximal radius and ulna are within normal limits. There is a transverse fracture through the distal humerus without significant displacement at the fracture  site. The fracture line courses through the olecranon fossa. Some soft tissue swelling is noted. Joint effusion is noted. IMPRESSION: Distal humeral fracture without significant displacement. Associated soft tissue changes and joint effusion are noted. Electronically Signed   By: Alcide Clever M.D.   On: 04/29/2018 14:18   Dg Shoulder Left  Result Date: 04/29/2018 CLINICAL DATA:  Left shoulder pain after a fall. EXAM: LEFT SHOULDER - 2+ VIEW COMPARISON:  02/12/2009 FINDINGS: Two views study shows no fracture. No evidence  for shoulder separation or dislocation. IMPRESSION: No acute bony finding. Electronically Signed   By: Kennith Center M.D.   On: 04/29/2018 13:29    Procedures Procedures (including critical care time)  Medications Ordered in ED Medications  HYDROcodone-acetaminophen (NORCO/VICODIN) 5-325 MG per tablet 2 tablet (2 tablets Oral Given 04/29/18 1439)     Initial Impression / Assessment and Plan / ED Course  I have reviewed the triage vital signs and the nursing notes.  Pertinent labs & imaging results that were available during my care of the patient were reviewed by me and considered in my medical decision making (see chart for details).     MDM  Xrays reviewed and discussed with pt.  Pt advised of fracture.   Pt placed in a posterior splint and sling. Pt advised to call Dr. Romeo Apple in am to schedule appointment.  Pt given rx for percocet    Final Clinical Impressions(s) / ED Diagnoses   Final diagnoses:  Humerus head fracture, left, closed, initial encounter    ED Discharge Orders         Ordered    oxyCODONE-acetaminophen (PERCOCET/ROXICET) 5-325 MG tablet  Every 4 hours PRN     04/29/18 1457        An After Visit Summary was printed and given to the patient.   Elson Areas, PA-C 04/30/18 1619    Samuel Jester, DO 05/03/18 1333

## 2018-05-01 ENCOUNTER — Ambulatory Visit: Payer: Medicaid Other | Admitting: Orthopedic Surgery

## 2018-05-01 ENCOUNTER — Encounter: Payer: Self-pay | Admitting: Orthopedic Surgery

## 2018-05-01 ENCOUNTER — Ambulatory Visit (INDEPENDENT_AMBULATORY_CARE_PROVIDER_SITE_OTHER): Payer: Medicaid Other

## 2018-05-01 ENCOUNTER — Telehealth: Payer: Self-pay | Admitting: Orthopedic Surgery

## 2018-05-01 VITALS — BP 128/89 | HR 74 | Ht 62.0 in | Wt 212.0 lb

## 2018-05-01 DIAGNOSIS — W19XXXA Unspecified fall, initial encounter: Secondary | ICD-10-CM

## 2018-05-01 DIAGNOSIS — S42402A Unspecified fracture of lower end of left humerus, initial encounter for closed fracture: Secondary | ICD-10-CM

## 2018-05-01 MED ORDER — HYDROCODONE-ACETAMINOPHEN 5-325 MG PO TABS: 1.0000 | ORAL_TABLET | Freq: Four times a day (QID) | ORAL | 0 refills | Status: DC | PRN

## 2018-05-01 NOTE — Progress Notes (Signed)
  NEW PATIENT OFFICE VISI  Chief Complaint  Patient presents with  . 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  Skin: Negative.   Neurological: Negative for tingling.     History reviewed. No pertinent past medical history.  Past Surgical History:  Procedure Laterality Date  . GASTRIC BYPASS    . TONSILLECTOMY      Family History  Problem Relation Age of Onset  . Chronic infections Mother   . Healthy Father   . High blood pressure Maternal Grandfather   . Diabetes Maternal Grandfather   . Diabetes Paternal Grandmother   . Cancer Paternal Grandmother    Social History   Tobacco Use  . Smoking status: Light Tobacco Smoker  . Smokeless tobacco: Never Used  Substance Use Topics  . Alcohol use: Yes    Comment: occ  . Drug use: No    Allergies  Allergen Reactions  . Ibuprofen     Gastric bypass surgery and history of gi bleed.    Current Meds  Medication Sig  . Multiple Vitamins-Minerals (ALIVE ONCE DAILY WOMENS PO) Take 1 tablet daily by mouth.    BP 128/89   Pulse 74   Ht 5\' 2"  (1.575 m)   Wt 212 lb (96.2 kg)   BMI 38.78 kg/m   Physical Exam  Constitutional: She is oriented to person, place, and time. She appears well-developed and well-nourished.  Moderate obesity  Neurological: She is alert and oriented to person, place, and time.  Psychiatric: She has a normal mood and affect. Judgment normal.  Vitals  reviewed.   Ortho Exam  Left elbow tenderness and swelling no range of motion joint stable muscle tone normal skin intact pulses good lymph nodes negative sensation normal  Right elbow no tenderness normal range of motion muscle tone normal skin intact  MEDICAL DECISION SECTION  X[facility] were done at [facility] and office today repeated x-ray after cast  My independent reading of xrays:  [facility] nondisplaced transcondylar fracture left elbow  Today's x-ray shows very slight anterior translation of the fracture no angulation  Encounter Diagnoses  Name Primary?  . Fall, initial encounter   . Closed fracture of distal end of left humerus, unspecified fracture morphology, initial encounter Yes    PLAN: (Rx., injectx, surgery, frx, mri/ct) Recommend long-arm cast x-ray weekly patient aware that fracture can move  Patient placed in long-arm cast  Follow-up 1 week x-ray in cast  Patient allowed to work light duty  Meds ordered this encounter  Medications  . HYDROcodone-acetaminophen (NORCO/VICODIN) 5-325 MG tablet    Sig: Take 1 tablet by mouth every 6 (six) hours as needed for moderate pain.    Dispense:  30 tablet    Refill:  0    Fuller CanadaStanley Zaara Sprowl, MD  05/01/2018 9:49 AM

## 2018-05-01 NOTE — Patient Instructions (Signed)
Patient is allowed to perform light duty which basically will involve desk work next 6 weeks

## 2018-05-01 NOTE — Telephone Encounter (Signed)
Patient left a message on voicemail saying that she needed to speak to someone about her medication. Something about her pharmacy needing some information.

## 2018-05-01 NOTE — Telephone Encounter (Signed)
He sent in Hydrocodone today. I called patient she told me she could not get it Pharmacy states it needs a prior authorization I have let them know she has a broken elbow. It needs to be done because she has just had the Rx for Oxycodone  I have filled out the prior authorization paperwork and put it on your desk for you to sign.   Once signed I can fax. I have advised patient she may want to try half of the Oxycodone with a Benadryl or Zyrtec

## 2018-05-06 ENCOUNTER — Telehealth: Payer: Self-pay | Admitting: Orthopedic Surgery

## 2018-05-06 DIAGNOSIS — S42402A Unspecified fracture of lower end of left humerus, initial encounter for closed fracture: Secondary | ICD-10-CM | POA: Insufficient documentation

## 2018-05-06 NOTE — Telephone Encounter (Signed)
Called patient, left voice message

## 2018-05-06 NOTE — Telephone Encounter (Signed)
ok 

## 2018-05-06 NOTE — Telephone Encounter (Signed)
Patient requests new work note for light duty/desk work indicating lifting restriction; states employer has requested. Patient is aware of upcoming appointment 05/08/18.

## 2018-05-07 ENCOUNTER — Encounter: Payer: Self-pay | Admitting: Orthopedic Surgery

## 2018-05-08 ENCOUNTER — Other Ambulatory Visit: Payer: Self-pay | Admitting: Orthopedic Surgery

## 2018-05-08 ENCOUNTER — Ambulatory Visit (INDEPENDENT_AMBULATORY_CARE_PROVIDER_SITE_OTHER): Payer: Medicaid Other

## 2018-05-08 ENCOUNTER — Ambulatory Visit (INDEPENDENT_AMBULATORY_CARE_PROVIDER_SITE_OTHER): Payer: Medicaid Other | Admitting: Orthopedic Surgery

## 2018-05-08 DIAGNOSIS — S42402A Unspecified fracture of lower end of left humerus, initial encounter for closed fracture: Secondary | ICD-10-CM

## 2018-05-08 DIAGNOSIS — W19XXXA Unspecified fall, initial encounter: Secondary | ICD-10-CM

## 2018-05-08 DIAGNOSIS — S42495D Other nondisplaced fracture of lower end of left humerus, subsequent encounter for fracture with routine healing: Secondary | ICD-10-CM | POA: Diagnosis not present

## 2018-05-08 DIAGNOSIS — W19XXXD Unspecified fall, subsequent encounter: Secondary | ICD-10-CM

## 2018-05-08 DIAGNOSIS — S42402D Unspecified fracture of lower end of left humerus, subsequent encounter for fracture with routine healing: Secondary | ICD-10-CM

## 2018-05-08 MED ORDER — HYDROCODONE-ACETAMINOPHEN 5-325 MG PO TABS: 1.0000 | ORAL_TABLET | Freq: Four times a day (QID) | ORAL | 0 refills | Status: DC | PRN

## 2018-05-08 NOTE — Progress Notes (Addendum)
Chief Complaint  Patient presents with  . Follow-up    Recheck on left elbow fracture, DOI 04-29-18.    Transcondylar fracture distal humerus treated with cast x-ray shows fracture alignment is anatomic nondisplaced fracture  Neurovascular exam intact  X-ray again in 1 week   Encounter Diagnoses  Name Primary?  . Other closed nondisplaced fracture of distal end of left humerus with routine healing, subsequent encounter 04/29/18   . Fall, initial encounter   . Closed fracture of distal end of left humerus, unspecified fracture morphology, initial encounter    Meds ordered this encounter  Medications  . HYDROcodone-acetaminophen (NORCO/VICODIN) 5-325 MG tablet    Sig: Take 1 tablet by mouth every 6 (six) hours as needed for moderate pain.    Dispense:  30 tablet    Refill:  0

## 2018-05-08 NOTE — Addendum Note (Signed)
Addended by: Fuller CanadaHARRISON, Worth Kober E on: 05/08/2018 12:00 PM   Modules accepted: Orders

## 2018-05-10 ENCOUNTER — Emergency Department (HOSPITAL_COMMUNITY)
Admission: EM | Admit: 2018-05-10 | Discharge: 2018-05-10 | Disposition: A | Discharge status: Home / Self Care | Payer: Medicaid Other | Attending: Emergency Medicine | Admitting: Emergency Medicine

## 2018-05-10 ENCOUNTER — Emergency Department (HOSPITAL_COMMUNITY): Payer: Medicaid Other

## 2018-05-10 ENCOUNTER — Other Ambulatory Visit: Payer: Self-pay

## 2018-05-10 ENCOUNTER — Encounter (HOSPITAL_COMMUNITY): Payer: Self-pay | Admitting: Emergency Medicine

## 2018-05-10 DIAGNOSIS — F17200 Nicotine dependence, unspecified, uncomplicated: Secondary | ICD-10-CM | POA: Diagnosis not present

## 2018-05-10 DIAGNOSIS — M1990 Unspecified osteoarthritis, unspecified site: Secondary | ICD-10-CM

## 2018-05-10 DIAGNOSIS — M25461 Effusion, right knee: Secondary | ICD-10-CM | POA: Diagnosis not present

## 2018-05-10 DIAGNOSIS — M179 Osteoarthritis of knee, unspecified: Secondary | ICD-10-CM | POA: Insufficient documentation

## 2018-05-10 DIAGNOSIS — Z79899 Other long term (current) drug therapy: Secondary | ICD-10-CM | POA: Insufficient documentation

## 2018-05-10 DIAGNOSIS — M25561 Pain in right knee: Secondary | ICD-10-CM | POA: Diagnosis present

## 2018-05-10 MED ORDER — NAPROXEN 250 MG PO TABS
500.0000 mg | ORAL_TABLET | Freq: Once | ORAL | Status: AC
  Administered 2018-05-10: 500 mg via ORAL
  Filled 2018-05-10: qty 2

## 2018-05-10 MED ORDER — NAPROXEN 500 MG PO TABS: 500.0000 mg | ORAL_TABLET | Freq: Two times a day (BID) | ORAL | 0 refills | Status: DC

## 2018-05-10 NOTE — ED Triage Notes (Signed)
Pt reports she feels like she sprained her knee. Denies injury or new activity. No OTC medication.

## 2018-05-10 NOTE — Discharge Instructions (Signed)
Continue using ice and elevation.  Avoid any activities that worsens your pain and swelling, especially significant flexing and straining your knee. Call Dr. Romeo AppleHarrison for further evaluation of your knee as discussed.

## 2018-05-10 NOTE — ED Provider Notes (Signed)
[facility]   CSN: [id number] Arrival date & time: 05/10/18  1515     History   Chief Complaint Chief Complaint  Patient presents with  . Knee Pain    Right    HPI Stacy [name] is a 37 y.o. female.  The history is provided by the patient.  Knee Pain   This is a new problem. The current episode started yesterday (she describes pain when she stood up from a chair yesterday mechanics adjusted due to lef forearm fx in cast, worse swelling when woke today.). The problem occurs constantly. The pain is present in the right knee. The quality of the pain is described as aching and constant. The pain is at a severity of 5/10. The pain is moderate. Associated symptoms include limited range of motion. Pertinent negatives include no numbness. The symptoms are aggravated by activity. Treatments tried: currently taking hydrocodone for arm fracture. The treatment provided no relief.    History reviewed. No pertinent past medical history.  Patient Active Problem List   Diagnosis Date Noted  . Closed fracture of distal end of left humerus 04/29/18 05/06/2018    Past Surgical History:  Procedure Laterality Date  . GASTRIC BYPASS    . TONSILLECTOMY       OB History   None      Home Medications    Prior to Admission medications   Medication Sig Start Date End Date Taking? Authorizing Provider  ALPRAZolam (XANAX) 0.25 MG tablet Take 1 tablet (0.25 mg total) by mouth 2 (two) times daily as needed. Patient not taking: Reported on 05/01/2018 04/07/18   Felicie MornSmith, David, NP  butalbital-acetaminophen-caffeine (FIORICET, ESGIC) [phone] MG tablet Take 1 tablet by mouth every 4 (four) hours as needed for headache. Patient not taking: Reported on 05/01/2018 03/31/18 03/31/19  Pauline Ausriplett, Tammy, PA-C  HYDROcodone-acetaminophen (NORCO/VICODIN) 5-325 MG tablet Take 1 tablet by mouth every 6 (six) hours as needed for moderate pain. 05/08/18   Vickki HearingHarrison, Stanley E, MD    Multiple Vitamins-Minerals (ALIVE ONCE DAILY WOMENS PO) Take 1 tablet daily by mouth.    [provider]  naproxen (NAPROSYN) 500 MG tablet Take 1 tablet (500 mg total) by mouth 2 (two) times daily. 05/10/18   Burgess AmorIdol, Manuel Lawhead, PA-C  oxyCODONE-acetaminophen (PERCOCET/ROXICET) 5-325 MG tablet Take 2 tablets by mouth every 4 (four) hours as needed for severe pain. Patient not taking: Reported on 05/01/2018 04/29/18   Osie CheeksSofia, Leslie K, PA-C    Family History Family History  Problem Relation Age of Onset  . Chronic infections Mother   . Healthy Father   . High blood pressure Maternal Grandfather   . Diabetes Maternal Grandfather   . Diabetes Paternal Grandmother   . Cancer Paternal Grandmother     Social History Social History   Tobacco Use  . Smoking status: Light Tobacco Smoker  . Smokeless tobacco: Never Used  Substance Use Topics  . Alcohol use: Yes    Comment: occ  . Drug use: No     Allergies   Ibuprofen   Review of Systems Review of Systems  Constitutional: Negative for fever.  Musculoskeletal: Positive for arthralgias and joint swelling. Negative for myalgias.  Neurological: Negative for weakness and numbness.     Physical Exam Updated Vital Signs BP 129/82 (BP Location: Right Arm)   Pulse 93   Temp 98.3 F (36.8 C) (Oral)   Resp 16   Ht 5\' 2"  (1.575 m)   Wt 96.2 kg  SpO2 100%   BMI 38.78 kg/m   Physical Exam  Constitutional: She appears well-developed and well-nourished.  HENT:  Head: Atraumatic.  Neck: Normal range of motion.  Cardiovascular:  Pulses equal bilaterally  Musculoskeletal: She exhibits tenderness.       Right knee: She exhibits effusion. She exhibits no ecchymosis, normal alignment, no LCL laxity and no MCL laxity.  Generalized tenderness, somewhat localizing to medial meniscus but no obvious deformity. No increased warmth, no erythema.  Neurological: She is alert. She has normal strength. She displays normal reflexes. No sensory  deficit.  Skin: Skin is warm and dry.  Psychiatric: She has a normal mood and affect.     ED Treatments / Results  Labs (all labs ordered are listed, but only abnormal results are displayed) Labs Reviewed - No data to display  EKG None  Radiology Dg Knee Complete 4 Views Right  Result Date: 05/10/2018 CLINICAL DATA:  Right knee pain EXAM: RIGHT KNEE - COMPLETE 4+ VIEW COMPARISON:  None. FINDINGS: No fracture or dislocation is seen. Moderate tricompartmental degenerative changes, most prominent in the patellofemoral compartment. The visualized soft tissues are unremarkable. Moderate suprapatellar knee joint effusion. IMPRESSION: Moderate tricompartmental degenerative changes. Moderate suprapatellar knee joint effusion. Electronically Signed   By: Charline Bills M.D.   On: 05/10/2018 15:48    Procedures Procedures (including critical care time)  Medications Ordered in ED Medications  naproxen (NAPROSYN) tablet 500 mg (500 mg Oral Given 05/10/18 1653)     Initial Impression / Assessment and Plan / ED Course  I have reviewed the triage vital signs and the nursing notes.  Pertinent labs & imaging results that were available during my care of the patient were reviewed by me and considered in my medical decision making (see chart for details).     Ace wrap, ice, activities as tolerated Discussed home care including avoiding flexion of the knee more than 90 degrees.  She has f/u for her right arm fracture in 8 days. Advised she call Dr Romeo Apple to advise of this new injury. Pt agrees with plan. She is ibuprofen allergic but does tolerate aleve, naproxen prescribed.  Final Clinical Impressions(s) / ED Diagnoses   Final diagnoses:  Effusion of right knee  Arthritis    ED Discharge Orders         Ordered    naproxen (NAPROSYN) 500 MG tablet  2 times daily     05/10/18 1637           Burgess Amor, PA-C 05/10/18 1800    Samuel Jester, DO 05/13/18 1558

## 2018-05-18 ENCOUNTER — Ambulatory Visit (INDEPENDENT_AMBULATORY_CARE_PROVIDER_SITE_OTHER): Payer: Medicaid Other

## 2018-05-18 ENCOUNTER — Ambulatory Visit (INDEPENDENT_AMBULATORY_CARE_PROVIDER_SITE_OTHER): Payer: Medicaid Other | Admitting: Orthopedic Surgery

## 2018-05-18 VITALS — BP 123/80 | HR 96 | Ht 62.0 in | Wt 212.0 lb

## 2018-05-18 DIAGNOSIS — S42495D Other nondisplaced fracture of lower end of left humerus, subsequent encounter for fracture with routine healing: Secondary | ICD-10-CM | POA: Diagnosis not present

## 2018-05-18 NOTE — Progress Notes (Signed)
Chief Complaint  Patient presents with  . Follow-up    Recheck on left elbow fracture, DOI 04/29/18.    Follow-up fracture care transcondylar humerus fracture or supracondylar humerus fracture left elbow treated in a cast  This is day #19 so she is about 3 weeks out she is been in a cast since the 13th.  Her x-ray shows transverse fracture no significant angulation no displacement.  I do not see any callus forming but she has significant pain relief in the cast and no displacement  Follow-up in a week x-ray out of plaster and hopefully conversion to a fracture brace  Encounter Diagnosis  Name Primary?  . Other closed nondisplaced fracture of distal end of left humerus with routine healing, subsequent encounter 04/29/18 Yes

## 2018-05-25 ENCOUNTER — Ambulatory Visit (INDEPENDENT_AMBULATORY_CARE_PROVIDER_SITE_OTHER): Payer: Medicaid Other | Admitting: Orthopedic Surgery

## 2018-05-25 ENCOUNTER — Encounter: Payer: Self-pay | Admitting: Orthopedic Surgery

## 2018-05-25 VITALS — BP 120/86 | HR 90 | Ht 62.0 in | Wt 215.0 lb

## 2018-05-25 DIAGNOSIS — S42495D Other nondisplaced fracture of lower end of left humerus, subsequent encounter for fracture with routine healing: Secondary | ICD-10-CM

## 2018-05-25 NOTE — Progress Notes (Signed)
Chief Complaint  Patient presents with  . Elbow Injury    DOI 04/29/18   Supracondylar transcondylar elbow fracture  Injury date # 26  X-ray had to be done at the hospital we waited until 5:58 PM it looks okay I would like to get a better true lateral x-ray but symptomatically she is doing well the elbow is stiff of course she is neurovascularly intact she is placed in an elbow brace with hinges full flexion starting at 50-90 degrees  Return in 1wk   xrays here 1 week

## 2018-05-26 ENCOUNTER — Telehealth: Payer: Self-pay | Admitting: Radiology

## 2018-05-26 NOTE — Telephone Encounter (Signed)
Call report this am from radiology. No bony bridging across fracture some posterior displacement of fracture   She was placed in a splint yesterday  To you FYI call report received.

## 2018-06-03 ENCOUNTER — Encounter: Payer: Self-pay | Admitting: Orthopedic Surgery

## 2018-06-03 ENCOUNTER — Ambulatory Visit (INDEPENDENT_AMBULATORY_CARE_PROVIDER_SITE_OTHER): Payer: Medicaid Other | Admitting: Orthopedic Surgery

## 2018-06-03 ENCOUNTER — Ambulatory Visit (INDEPENDENT_AMBULATORY_CARE_PROVIDER_SITE_OTHER): Payer: Medicaid Other

## 2018-06-03 VITALS — BP 108/63 | HR 91 | Ht 62.0 in | Wt 215.0 lb

## 2018-06-03 DIAGNOSIS — S42495D Other nondisplaced fracture of lower end of left humerus, subsequent encounter for fracture with routine healing: Secondary | ICD-10-CM | POA: Diagnosis not present

## 2018-06-03 DIAGNOSIS — W19XXXA Unspecified fall, initial encounter: Secondary | ICD-10-CM

## 2018-06-03 DIAGNOSIS — S42402A Unspecified fracture of lower end of left humerus, initial encounter for closed fracture: Secondary | ICD-10-CM

## 2018-06-03 MED ORDER — HYDROCODONE-ACETAMINOPHEN 5-325 MG PO TABS: 1.0000 | ORAL_TABLET | Freq: Four times a day (QID) | ORAL | 0 refills | Status: DC | PRN

## 2018-06-03 NOTE — Progress Notes (Signed)
Chief Complaint  Patient presents with  . Elbow Injury    04/29/18 left distal humerus fracture     Encounter Diagnoses  Name Primary?  . Other closed nondisplaced fracture of distal end of left humerus with routine healing, subsequent encounter 04/29/18 Yes  . Fall, initial encounter   . Closed fracture of distal end of left humerus, unspecified fracture morphology, initial encounter     Meds ordered this encounter  Medications  . HYDROcodone-acetaminophen (NORCO/VICODIN) 5-325 MG tablet    Sig: Take 1 tablet by mouth every 6 (six) hours as needed for moderate pain.    Dispense:  30 tablet    Refill:  0    X-ray shows that the fracture is stable she is in a brace, I opened it up full range of motion come back in 2 weeks  Return to work on Saturday

## 2018-06-03 NOTE — Patient Instructions (Signed)
Return to work Saturday in the brace

## 2018-06-04 ENCOUNTER — Telehealth: Payer: Self-pay | Admitting: Orthopedic Surgery

## 2018-06-04 ENCOUNTER — Encounter: Payer: Self-pay | Admitting: Orthopedic Surgery

## 2018-06-04 NOTE — Telephone Encounter (Signed)
Not sure what you want give her whatever she needs as far as the note  She cant lift > 5 lbs

## 2018-06-04 NOTE — Telephone Encounter (Signed)
Patient called about the work note issued to return to work on Saturday, 06/13/18, in brace. 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 Patient's next scheduled visit is 06/17/18.  Please advise.  [phone]

## 2018-06-04 NOTE — Telephone Encounter (Signed)
Relayed to patient. Entered note accordingly as previously noted no lifting>5 lbs, and as per yesterday's note, wearing brace, through 06/17/18 (date of next appointment) or until further notice.

## 2018-06-17 ENCOUNTER — Ambulatory Visit (INDEPENDENT_AMBULATORY_CARE_PROVIDER_SITE_OTHER): Payer: Medicaid Other | Admitting: Orthopedic Surgery

## 2018-06-17 ENCOUNTER — Encounter: Payer: Self-pay | Admitting: Orthopedic Surgery

## 2018-06-17 ENCOUNTER — Ambulatory Visit (INDEPENDENT_AMBULATORY_CARE_PROVIDER_SITE_OTHER): Payer: Medicaid Other

## 2018-06-17 VITALS — BP 126/86 | HR 114 | Ht 62.0 in | Wt 203.0 lb

## 2018-06-17 DIAGNOSIS — S42402A Unspecified fracture of lower end of left humerus, initial encounter for closed fracture: Secondary | ICD-10-CM

## 2018-06-17 DIAGNOSIS — S42495D Other nondisplaced fracture of lower end of left humerus, subsequent encounter for fracture with routine healing: Secondary | ICD-10-CM

## 2018-06-17 DIAGNOSIS — W19XXXA Unspecified fall, initial encounter: Secondary | ICD-10-CM

## 2018-06-17 MED ORDER — HYDROCODONE-ACETAMINOPHEN 5-325 MG PO TABS: 1.0000 | ORAL_TABLET | Freq: Three times a day (TID) | ORAL | 0 refills | Status: AC | PRN

## 2018-06-17 NOTE — Patient Instructions (Signed)
RTW NOV 4

## 2018-06-17 NOTE — Progress Notes (Signed)
Chief Complaint  Patient presents with  . Elbow Injury    follow up left elbow fracture 04/29/18    0-90 ROM   DAY 49 transcondylar fracture supracondylar region left elbow currently in range of motion brace  X-rays show bridging callus posteriorly  Patient is allowed to return to work no heavy lifting on November 4

## 2018-06-18 ENCOUNTER — Other Ambulatory Visit: Payer: Self-pay

## 2018-06-18 ENCOUNTER — Emergency Department (HOSPITAL_COMMUNITY): Payer: Medicaid Other

## 2018-06-18 ENCOUNTER — Emergency Department (HOSPITAL_COMMUNITY)
Admission: EM | Admit: 2018-06-18 | Discharge: 2018-06-18 | Disposition: A | Discharge status: Home / Self Care | Payer: Medicaid Other | Attending: Emergency Medicine | Admitting: Emergency Medicine

## 2018-06-18 ENCOUNTER — Encounter (HOSPITAL_COMMUNITY): Payer: Self-pay

## 2018-06-18 DIAGNOSIS — R079 Chest pain, unspecified: Secondary | ICD-10-CM | POA: Diagnosis present

## 2018-06-18 DIAGNOSIS — Z9884 Bariatric surgery status: Secondary | ICD-10-CM | POA: Diagnosis not present

## 2018-06-18 DIAGNOSIS — F17210 Nicotine dependence, cigarettes, uncomplicated: Secondary | ICD-10-CM | POA: Diagnosis not present

## 2018-06-18 DIAGNOSIS — R42 Dizziness and giddiness: Secondary | ICD-10-CM | POA: Insufficient documentation

## 2018-06-18 DIAGNOSIS — E860 Dehydration: Secondary | ICD-10-CM | POA: Insufficient documentation

## 2018-06-18 DIAGNOSIS — Z79899 Other long term (current) drug therapy: Secondary | ICD-10-CM | POA: Insufficient documentation

## 2018-06-18 LAB — BASIC METABOLIC PANEL
Anion gap: 14 (ref 5–15)
BUN: 9 mg/dL (ref 6–20)
CHLORIDE: 101 mmol/L (ref 98–111)
CO2: 22 mmol/L (ref 22–32)
CREATININE: 0.77 mg/dL (ref 0.44–1.00)
Calcium: 9.5 mg/dL (ref 8.9–10.3)
GFR calc Af Amer: >60 mL/min (ref >60)
GLUCOSE: 90 mg/dL (ref 70–99)
POTASSIUM: 4.2 mmol/L (ref 3.5–5.1)
SODIUM: 137 mmol/L (ref 135–145)

## 2018-06-18 LAB — CBC
HEMATOCRIT: 42.6 % (ref 36.0–46.0)
HEMOGLOBIN: 13.9 g/dL (ref 12.0–15.0)
MCH: 30.8 pg (ref 26.0–34.0)
MCHC: 32.6 g/dL (ref 30.0–36.0)
MCV: 94.5 fL (ref 80.0–100.0)
Platelets: 251 10*3/uL (ref 150–400)
RBC: 4.51 MIL/uL (ref 3.87–5.11)
RDW: 15 % (ref 11.5–15.5)
WBC: 9.5 10*3/uL (ref 4.0–10.5)
nRBC: 0 % (ref 0.0–0.2)

## 2018-06-18 LAB — URINALYSIS, ROUTINE W REFLEX MICROSCOPIC
BILIRUBIN URINE: NEGATIVE
Bacteria, UA: NONE SEEN
GLUCOSE, UA: NEGATIVE mg/dL
Ketones, ur: 80 mg/dL — AB
LEUKOCYTES UA: NEGATIVE
NITRITE: NEGATIVE
Protein, ur: 100 mg/dL — AB
Specific Gravity, Urine: 1.027 (ref 1.005–1.030)
pH: 5 (ref 5.0–8.0)

## 2018-06-18 LAB — GLUCOSE, CAPILLARY: GLUCOSE-CAPILLARY: 76 mg/dL (ref 70–99)

## 2018-06-18 LAB — PREGNANCY, URINE: Preg Test, Ur: NEGATIVE

## 2018-06-18 LAB — TROPONIN I: Troponin I: <0.03 ng/mL (ref <0.03)

## 2018-06-18 MED ORDER — IOPAMIDOL (ISOVUE-370) INJECTION 76%
100.0000 mL | Freq: Once | INTRAVENOUS | Status: AC | PRN
  Administered 2018-06-18: 100 mL via INTRAVENOUS

## 2018-06-18 MED ORDER — SODIUM CHLORIDE 0.9 % IV BOLUS
1000.0000 mL | Freq: Once | INTRAVENOUS | Status: AC
  Administered 2018-06-18: 1000 mL via INTRAVENOUS

## 2018-06-18 NOTE — ED Triage Notes (Signed)
Pt reports that her chest pain started approx 1130. Pt reports that she was drinking coffee when pain started and pt took hydrocodone at that time  And relieved pain. Pt reports nausea and dry mouth

## 2018-06-18 NOTE — ED Provider Notes (Signed)
[facility] EMERGENCY DEPARTMENT Provider Note   CSN: [id number] Arrival date & time: 06/18/18  1358     History   Chief Complaint Chief Complaint  Patient presents with  . Chest Pain    HPI Stacy [name] is a 37 y.o. female.   Chest Pain      The patient is a 37 year old female who recently had a closed fracture of her left distal humerus, she was treated with immobilization in a cast and then a splint which she wore for over 6 weeks, she was recently taken out of this and her pain continues that she is still taking hydrocodone however today she reports that after waking up feeling normally, at approximately 11:00 in the morning, she started to feel lightheaded like she was going to pass out, felt as though her right side of the chest was hurting and a sharp and stabbing nature, this has improved but she still feels symptomatic both with her chest and the lightheadedness.  She denies feeling short of breath, denies fevers or coughing, denies swelling of the legs but has had persistent mild swelling of the left arm.  She has had no medications for this specific complaint prior to arrival.  She denies associated diarrhea, dysuria, blurred vision, focal weakness or numbness.  She states that she does feel like she had a dry mouth and was shaky  History reviewed. No pertinent past medical history.  Patient Active Problem List   Diagnosis Date Noted  . Closed fracture of distal end of left humerus 04/29/18 05/06/2018    Past Surgical History:  Procedure Laterality Date  . GASTRIC BYPASS    . TONSILLECTOMY       OB History   None      Home Medications    Prior to Admission medications   Medication Sig Start Date End Date Taking? Authorizing Provider  HYDROcodone-acetaminophen (NORCO/VICODIN) 5-325 MG tablet Take 1 tablet by mouth every 8 (eight) hours as needed for up to 7 days for moderate pain. 06/17/18 06/24/18 Yes Vickki Hearing, MD  Multiple  Vitamins-Minerals (ALIVE ONCE DAILY WOMENS PO) Take 1 tablet daily by mouth.   Yes [provider]  ALPRAZolam (XANAX) 0.25 MG tablet Take 1 tablet (0.25 mg total) by mouth 2 (two) times daily as needed. Patient not taking: Reported on 05/01/2018 04/07/18   Felicie Morn, NP    Family History Family History  Problem Relation Age of Onset  . Chronic infections Mother   . Healthy Father   . High blood pressure Maternal Grandfather   . Diabetes Maternal Grandfather   . Diabetes Paternal Grandmother   . Cancer Paternal Grandmother     Social History Social History   Tobacco Use  . Smoking status: Heavy Tobacco Smoker    Packs/day: 0.50    Types: Cigarettes  . Smokeless tobacco: Never Used  Substance Use Topics  . Alcohol use: Yes    Comment: occ  . Drug use: No     Allergies   Ibuprofen   Review of Systems Review of Systems  Cardiovascular: Positive for chest pain.  All other systems reviewed and are negative.    Physical Exam Updated Vital Signs BP 125/61   Pulse (!) 107   Temp 98.3 F (36.8 C) (Oral)   Resp 18   Ht 1.575 m (5\' 2" )   Wt 88.9 kg   LMP 06/18/2018 Comment: pt denies having mentraul periods  SpO2 100%   BMI 35.85 kg/m   Physical  Exam  Constitutional: She appears well-developed and well-nourished. No distress.  HENT:  Head: Normocephalic and atraumatic.  Mouth/Throat: Oropharynx is clear and moist. No oropharyngeal exudate.  Eyes: Pupils are equal, round, and reactive to light. Conjunctivae and EOM are normal. Right eye exhibits no discharge. Left eye exhibits no discharge. No scleral icterus.  Neck: Normal range of motion. Neck supple. No JVD present. No thyromegaly present.  Cardiovascular: Regular rhythm, normal heart sounds and intact distal pulses. Exam reveals no gallop and no friction rub.  No murmur heard. Sinus tachycardia  Pulmonary/Chest: Effort normal and breath sounds normal. No respiratory distress. She has no wheezes. She  has no rales.  Abdominal: Soft. Bowel sounds are normal. She exhibits no distension and no mass. There is no tenderness.  Musculoskeletal: Normal range of motion. She exhibits tenderness ( has some pain in the R elbow with good ROM and mild swelling). She exhibits no edema.  Lymphadenopathy:    She has no cervical adenopathy.  Neurological: She is alert. Coordination normal.  Skin: Skin is warm and dry. No rash noted. No erythema.  Psychiatric: She has a normal mood and affect. Her behavior is normal.  Nursing note and vitals reviewed.    ED Treatments / Results  Labs (all labs ordered are listed, but only abnormal results are displayed) Labs Reviewed  URINALYSIS, ROUTINE W REFLEX MICROSCOPIC - Abnormal; Notable for the following components:      Result Value   Hgb urine dipstick SMALL (*)    Ketones, ur 80 (*)    Protein, ur 100 (*)    All other components within normal limits  BASIC METABOLIC PANEL  CBC  TROPONIN I  GLUCOSE, CAPILLARY  PREGNANCY, URINE    EKG EKG Interpretation  Date/Time:  Thursday June 18 2018 14:13:11 EDT Ventricular Rate:  108 PR Interval:  156 QRS Duration: 68 QT Interval:  340 QTC Calculation: 455 R Axis:   56 Text Interpretation:  Sinus tachycardia Septal infarct , age undetermined Abnormal ECG since last tracing no significant change Confirmed by Eber Hong ([id number]) on 06/18/2018 3:16:20 PM   Radiology Dg Chest 2 View  Result Date: 06/18/2018 CLINICAL DATA:  Chest pain and shortness of breath today. EXAM: CHEST - 2 VIEW COMPARISON:  None. FINDINGS: The heart size and mediastinal contours are within normal limits. Both lungs are clear. The visualized skeletal structures are unremarkable. IMPRESSION: Normal chest x-ray. Electronically Signed   By: Rudie Meyer M.D.   On: 06/18/2018 14:31   Dg Elbow 2 Views Left  Result Date: 06/17/2018 X-ray report Dr. Romeo Apple dictating AP lateral left elbow Callus is seen in the posterior aspect of  the fracture which is stable. Healing transverse condylar fracture left elbow  Ct Angio Chest Pe W And/or Wo Contrast  Result Date: 06/18/2018 CLINICAL DATA:  Anterior chest pain today, nausea, evaluate for possible pulmonary embolism EXAM: CT ANGIOGRAPHY CHEST WITH CONTRAST TECHNIQUE: Multidetector CT imaging of the chest was performed using the standard protocol during bolus administration of intravenous contrast. Multiplanar CT image reconstructions and MIPs were obtained to evaluate the vascular anatomy. CONTRAST:  ISOVUE-370 IOPAMIDOL (ISOVUE-370) INJECTION 76% COMPARISON:  Chest x-ray 06/18/2017 FINDINGS: Cardiovascular: The pulmonary arteries are moderately well opacified. There is some motion at the lung bases which limits evaluation but no definite pulmonary embolism is seen. The central pulmonary arteries are well seen with no abnormality noted. The heart is normal in size. No pericardial effusion is seen. The thoracic aorta opacifies well with no  acute abnormality noted. Mediastinum/Nodes: No mediastinal or hilar adenopathy is noted. The thyroid gland appears unremarkable. No hiatal hernia is evident. Lungs/Pleura: On lung window images, no lung parenchymal infiltrate is seen and there is no evidence of pleural effusion. No suspicious lung nodule or mass is noted. The central airway is patent. Upper Abdomen: The liver appears very low in attenuation most consistent with hepatic steatosis. Multiple surgical sutures are present in the left upper quadrant presumably due to prior gastric bypass surgery. Musculoskeletal: The thoracic vertebrae are in normal alignment with only mild degenerative change in the lower thoracic spine. Review of the MIP images confirms the above findings. IMPRESSION: 1. No evidence of acute pulmonary embolism is seen. There is some motion at the lung bases obscuring detail but no pulmonary embolus is evident. 2. No pneumonia or pleural effusion is seen. 3. The liver  appears very low in attenuation most consistent with hepatic steatosis. Electronically Signed   By: Dwyane Dee M.D.   On: 06/18/2018 16:19    Procedures Procedures (including critical care time)  Medications Ordered in ED Medications  iopamidol (ISOVUE-370) 76 % injection 100 mL (100 mLs Intravenous Contrast Given 06/18/18 1558)  sodium chloride 0.9 % bolus 1,000 mL (1,000 mLs Intravenous New Bag/Given 06/18/18 1704)     Initial Impression / Assessment and Plan / ED Course  I have reviewed the triage vital signs and the nursing notes.  Pertinent labs & imaging results that were available during my care of the patient were reviewed by me and considered in my medical decision making (see chart for details).  Clinical Course as of Jun 18 1810  Thu Jun 18, 2018  1644 CT scan is negative for any acute findings.  The patient's lab work suggest dehydration which may be responsible for some of her lightheadedness.  She does have ketonuria, she is not hyperglycemic or in DKA.  IV fluids ordered, patient will be stable for discharge, given reassurance that her chest pain is likely pleurisy   [BM]    Clinical Course User Index [BM] Eber Hong, MD   The patient has what appears to be a normal chest x-ray.  I have looked at it myself, there is no signs of infiltrate mediastinal abnormalities, pneumothorax or other abnormalities.  X-ray of the elbow from yesterday showed healing callus on the transverse condylar fracture of the left elbow.  I am concerned that the patient is at risk for pulmonary embolism, this may just be pleurisy, she has not had any infectious symptoms to think this was pneumonia.  CT angiogram will be ordered, the patient is mildly tachycardic, the rest of her exam is unremarkable  Final Clinical Impressions(s) / ED Diagnoses   Final diagnoses:  Dehydration  Right-sided chest pain      Eber Hong, MD 06/18/18 1811

## 2018-06-18 NOTE — ED Notes (Signed)
PT TRANSPORTED TO XRAY 

## 2018-06-18 NOTE — Discharge Instructions (Signed)
Tylenol for pain Your CT scan looks good - no signs of blood clot You will need to drink plenty of fluids See yoru doctor in 2 days for recheck  [facility] Primary Care Doctor List    Kari Baars MD. Specialty: Pulmonary Disease Contact information: [address]  [address]  [place] [address]  [phone]   Syliva Overman, MD. Specialty: [facility] Medicine Contact information: [address]  [place] [address]  [phone]   Lilyan Punt, MD. Specialty: Family Medicine Contact information: [address]  [place] [address]  [phone]   Avon Gully, MD Specialty: Internal Medicine Contact information: [address] [place] [address]  [phone]   Catalina Pizza, MD. Specialty: Internal Medicine Contact information: [address]  [place] [address]  [phone]    [facility] (Dr. Selena Batten) Specialty: Family Medicine Contact information: [address]  [place] [address]  [phone]   John Giovanni, MD. Specialty: [facility] Medicine Contact information: [address]  [address]  [place] [address]  [phone]   Carylon Perches, MD. Specialty: Internal Medicine Contact information: [address]  [address]  [place] [address]  [phone]    [facility]  [address] [place], [place] [address] [phone]  Services [facility] offers a variety of basic health services.  Services include but are not limited to: Blood pressure checks  Heart rate checks  Blood sugar checks  Urine analysis  Rapid strep tests  Pregnancy tests.  Health education and referrals  People needing more complex services will be directed to a physician online. Using these virtual visits, doctors can evaluate and prescribe medicine and treatments. There will be no medication on-site, though [facility] will help  patients fill their prescriptions at little to no cost.   For More information please go to: [email or web address]

## 2018-10-23 ENCOUNTER — Emergency Department (HOSPITAL_COMMUNITY): Payer: Medicaid Other

## 2018-10-23 ENCOUNTER — Encounter (HOSPITAL_COMMUNITY): Payer: Self-pay | Admitting: *Deleted

## 2018-10-23 ENCOUNTER — Other Ambulatory Visit: Payer: Self-pay

## 2018-10-23 ENCOUNTER — Emergency Department (HOSPITAL_COMMUNITY)
Admission: EM | Admit: 2018-10-23 | Discharge: 2018-10-23 | Disposition: A | Discharge status: Home / Self Care | Payer: Medicaid Other | Attending: Emergency Medicine | Admitting: Emergency Medicine

## 2018-10-23 ENCOUNTER — Emergency Department (HOSPITAL_COMMUNITY)
Admission: EM | Admit: 2018-10-23 | Discharge: 2018-10-23 | Disposition: A | Discharge status: Home / Self Care | Payer: Self-pay

## 2018-10-23 DIAGNOSIS — F17210 Nicotine dependence, cigarettes, uncomplicated: Secondary | ICD-10-CM | POA: Insufficient documentation

## 2018-10-23 DIAGNOSIS — Z9884 Bariatric surgery status: Secondary | ICD-10-CM | POA: Diagnosis not present

## 2018-10-23 DIAGNOSIS — Z79899 Other long term (current) drug therapy: Secondary | ICD-10-CM | POA: Insufficient documentation

## 2018-10-23 DIAGNOSIS — M1990 Unspecified osteoarthritis, unspecified site: Secondary | ICD-10-CM | POA: Insufficient documentation

## 2018-10-23 DIAGNOSIS — R072 Precordial pain: Secondary | ICD-10-CM | POA: Insufficient documentation

## 2018-10-23 DIAGNOSIS — M25561 Pain in right knee: Secondary | ICD-10-CM | POA: Diagnosis not present

## 2018-10-23 DIAGNOSIS — R079 Chest pain, unspecified: Secondary | ICD-10-CM | POA: Diagnosis present

## 2018-10-23 LAB — BASIC METABOLIC PANEL
ANION GAP: 12 (ref 5–15)
BUN: 9 mg/dL (ref 6–20)
CALCIUM: 9.6 mg/dL (ref 8.9–10.3)
CO2: 23 mmol/L (ref 22–32)
Chloride: 103 mmol/L (ref 98–111)
Creatinine, Ser: 0.72 mg/dL (ref 0.44–1.00)
GFR calc Af Amer: >60 mL/min (ref >60)
GFR calc non Af Amer: >60 mL/min (ref >60)
GLUCOSE: 93 mg/dL (ref 70–99)
POTASSIUM: 4.1 mmol/L (ref 3.5–5.1)
Sodium: 138 mmol/L (ref 135–145)

## 2018-10-23 LAB — CBC
HCT: 41.9 % (ref 36.0–46.0)
HEMOGLOBIN: 13.5 g/dL (ref 12.0–15.0)
MCH: 33.3 pg (ref 26.0–34.0)
MCHC: 32.2 g/dL (ref 30.0–36.0)
MCV: 103.5 fL — AB (ref 80.0–100.0)
Platelets: 253 10*3/uL (ref 150–400)
RBC: 4.05 MIL/uL (ref 3.87–5.11)
RDW: 14.6 % (ref 11.5–15.5)
WBC: 6.1 10*3/uL (ref 4.0–10.5)
nRBC: 0 % (ref 0.0–0.2)

## 2018-10-23 LAB — TROPONIN I: Troponin I: <0.03 ng/mL (ref <0.03)

## 2018-10-23 MED ORDER — HYDROCODONE-ACETAMINOPHEN 5-325 MG PO TABS: 2.0000 | ORAL_TABLET | ORAL | 0 refills | Status: DC | PRN

## 2018-10-23 NOTE — ED Triage Notes (Signed)
Pt called for triage x 2, no answer in waiting room. Pt assumed to have left prior to seeing triage RN.

## 2018-10-23 NOTE — ED Triage Notes (Signed)
Pt called for triage x 1, no answer in waiting room.

## 2018-10-23 NOTE — ED Triage Notes (Signed)
Pt c/o "shooting chest pain and feels like my legs are about to burst, they feel numb and burning" that started at 0900 this morning. Denies SOB, nausea. Pt reports pain in the past in her legs and was told she had arthritis.

## 2018-10-23 NOTE — Discharge Instructions (Signed)
Work-up for the chest pain are not consistent with acute cardiac event.  Troponin was negative.  The right knee pain is consistent with an inflammatory type arthritis similar to the findings she had back in September.  Take the hydrocodone as directed make an appointment to follow-up with the wellness clinic also given information about orthopedics with Dr. Valentina Shaggy here in [place] if needed.  But since living [place] may just want a follow-up with the wellness clinic.  Return for any new or worse symptoms.

## 2018-10-23 NOTE — ED Notes (Signed)
ED Provider at bedside.- Dr Zackowski 

## 2018-10-23 NOTE — ED Provider Notes (Addendum)
[facility] EMERGENCY DEPARTMENT Provider Note   CSN: [id number] Arrival date & time: 10/23/18  1526    History   Chief Complaint Chief Complaint  Patient presents with  . Chest Pain  . Leg Pain    HPI Stacy [name] is a 38 y.o. female.  HPI: A 38 year old patient presents for evaluation of chest pain. Initial onset of pain was more than 6 hours ago. The patient's chest pain is not worse with exertion. The patient's chest pain is middle- or left-sided, is not well-localized, is not described as heaviness/pressure/tightness, is not sharp and does not radiate to the arms/jaw/neck. The patient does not complain of nausea and denies diaphoresis. The patient has smoked in the past 90 days. The patient has no history of stroke, has no history of peripheral artery disease, denies any history of treated diabetes, has no relevant family history of coronary artery disease (first degree relative at less than age 59), is not hypertensive, has no history of hypercholesterolemia and does not have an elevated BMI (>=30).   Patient presenting with 2 separate complaints.  1 is chest pain substernal intermittent since 9:00 this morning.  Lasting only seconds.  Not radiating.  No shortness of breath no nausea or vomiting currently no pain.  The other is right knee swelling and pain.  She has had trouble with that in the past last seen for that in September 2019.  And then has some bilateral leg numbness below the knees.  But no known history of diabetes and no known history of this occurring before.     History reviewed. No pertinent past medical history.  Patient Active Problem List   Diagnosis Date Noted  . Closed fracture of distal end of left humerus 04/29/18 05/06/2018    Past Surgical History:  Procedure Laterality Date  . GASTRIC BYPASS    . TONSILLECTOMY       OB History   No obstetric history on file.      Home Medications    Prior to Admission medications   Medication Sig  Start Date End Date Taking? Authorizing Provider  Multiple Vitamins-Minerals (ALIVE ONCE DAILY WOMENS PO) Take 1 tablet daily by mouth.   Yes [provider]  HYDROcodone-acetaminophen (NORCO/VICODIN) 5-325 MG tablet Take 2 tablets by mouth every 4 (four) hours as needed. 10/23/18   Vanetta Mulders, MD    Family History Family History  Problem Relation Age of Onset  . Chronic infections Mother   . Healthy Father   . High blood pressure Maternal Grandfather   . Diabetes Maternal Grandfather   . Diabetes Paternal Grandmother   . Cancer Paternal Grandmother     Social History Social History   Tobacco Use  . Smoking status: Current Some Day Smoker    Packs/day: 0.50    Types: Cigarettes  . Smokeless tobacco: Never Used  . Tobacco comment: 4 cigarettes daily when she does smoke  Substance Use Topics  . Alcohol use: Yes    Comment: occ  . Drug use: No     Allergies   Ibuprofen   Review of Systems Review of Systems  Constitutional: Negative for chills and fever.  HENT: Negative for congestion, rhinorrhea and sore throat.   Eyes: Negative for visual disturbance.  Respiratory: Negative for cough and shortness of breath.   Cardiovascular: Positive for chest pain. Negative for leg swelling.  Gastrointestinal: Negative for abdominal pain, diarrhea, nausea and vomiting.  Genitourinary: Negative for dysuria.  Musculoskeletal: Positive for joint swelling.  Negative for back pain and neck pain.  Skin: Negative for rash.  Neurological: Positive for numbness. Negative for dizziness, light-headedness and headaches.  Hematological: Does not bruise/bleed easily.  Psychiatric/Behavioral: Negative for confusion.     Physical Exam Updated Vital Signs BP (!) 145/72   Pulse 86   Temp 98.6 F (37 C)   Resp 18   Ht 1.575 m (5\' 2" )   Wt 89.8 kg   SpO2 100%   BMI 36.21 kg/m   Physical Exam Vitals signs and nursing note reviewed.  Constitutional:      General: She is not  in acute distress.    Appearance: She is well-developed.  HENT:     Head: Normocephalic and atraumatic.     Nose: No congestion.     Mouth/Throat:     Mouth: Mucous membranes are moist.  Eyes:     Extraocular Movements: Extraocular movements intact.     Conjunctiva/sclera: Conjunctivae normal.     Pupils: Pupils are equal, round, and reactive to light.  Neck:     Musculoskeletal: Normal range of motion and neck supple. No neck rigidity.  Cardiovascular:     Rate and Rhythm: Normal rate and regular rhythm.     Heart sounds: No murmur.  Pulmonary:     Effort: Pulmonary effort is normal. No respiratory distress.     Breath sounds: Normal breath sounds. No wheezing.  Chest:     Chest wall: No tenderness.  Abdominal:     Palpations: Abdomen is soft.     Tenderness: There is no abdominal tenderness.  Musculoskeletal: Normal range of motion.        General: Swelling present.     Comments: Swelling to right knee no erythema but some increased warmth.  Left knee normal.  Distally neurovascularly intact good cap refill dorsalis pedis pulse 1-2+.  No calf tenderness.  Skin:    General: Skin is warm and dry.     Capillary Refill: Capillary refill takes less than 2 seconds.  Neurological:     General: No focal deficit present.     Mental Status: She is alert and oriented to person, place, and time.     Cranial Nerves: No cranial nerve deficit.     Sensory: No sensory deficit.     Motor: No weakness.      ED Treatments / Results  Labs (all labs ordered are listed, but only abnormal results are displayed) Labs Reviewed  CBC - Abnormal; Notable for the following components:      Result Value   MCV 103.5 (*)    All other components within normal limits  BASIC METABOLIC PANEL  TROPONIN I    EKG EKG Interpretation  Date/Time:  Friday October 23 2018 17:21:02 EST Ventricular Rate:  93 PR Interval:  142 QRS Duration: 70 QT Interval:  394 QTC Calculation: 489 R Axis:   30 Text  Interpretation:  Normal sinus rhythm Septal infarct , age undetermined Abnormal ECG No significant change since last tracing Confirmed by Vanetta Mulders [phone]) on 10/23/2018 9:46:25 PM   Radiology Dg Chest 2 View  Result Date: 10/23/2018 CLINICAL DATA:  Chest pain. EXAM: CHEST - 2 VIEW COMPARISON:  CT of the chest 06/18/2018. FINDINGS: The heart size and mediastinal contours are within normal limits. Both lungs are clear. The visualized skeletal structures are unremarkable. IMPRESSION: Negative two view chest x-ray Electronically Signed   By: Marin Roberts M.D.   On: 10/23/2018 18:25   Dg Knee Complete 4 Views  Right  Result Date: 10/23/2018 CLINICAL DATA:  Sharp right knee pain EXAM: RIGHT KNEE - COMPLETE 4+ VIEW COMPARISON:  None. FINDINGS: There are large patellofemoral and lateral femorotibial osteophytes. The femorotibial joint space is preserved. No knee effusion. No acute fracture or dislocation. 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 Electronically Signed   By: Deatra Robinson M.D.   On: 10/23/2018 22:25    Procedures Procedures (including critical care time)  Medications Ordered in ED Medications - No data to display   Initial Impression / Assessment and Plan / ED Course  I have reviewed the triage vital signs and the nursing notes.  Pertinent labs & imaging results that were available during my care of the patient were reviewed by me and considered in my medical decision making (see chart for details).      Patient's chest pain although anterior chest substernal into the left very atypical in that it only last seconds.  Been coming and going throughout the day nonradiating no shortness of breath no diaphoresis no nausea or vomiting.  Patient's troponin negative.  Patient's cardiac risk factor is minimal her heart score is 1.  Chest x-ray negative EKG without acute changes.  Labs without any significant abnormalities.  No evidence of  diabetes.  X-rays of the right knee are consistent with findings from September suggestive of a osteoarthritis type inflammatory changes.  We will treat this with hydrocodone since patient is unable to take Motrin type medicine since she had gastric bypass.  Patient currently has moved back up here from [place] has family in the [place] area but currently living in [place] will have her follow-up with the wellness clinic there.  Patient's database was checked patient had not received any narcotic medications and [place] since October 2019.  So we will give an hydrocodone prepack here to go.   No significant cardiac risk factors  HEAR Score: 1  Heart score is 1  Final Clinical Impressions(s) / ED Diagnoses   Final diagnoses:  Precordial pain  Acute pain of right knee  Arthritis    ED Discharge Orders         Ordered    HYDROcodone-acetaminophen (NORCO/VICODIN) 5-325 MG tablet  Every 4 hours PRN     10/23/18 2305           Vanetta Mulders, MD 10/23/18 2311    Vanetta Mulders, MD 10/23/18 2312

## 2018-10-28 MED FILL — Hydrocodone-Acetaminophen Tab 5-325 MG: ORAL | Qty: 6 | Status: AC

## 2018-11-10 ENCOUNTER — Emergency Department (HOSPITAL_COMMUNITY)
Admission: EM | Admit: 2018-11-10 | Discharge: 2018-11-10 | Disposition: A | Discharge status: Home / Self Care | Payer: Medicaid Other | Attending: Emergency Medicine | Admitting: Emergency Medicine

## 2018-11-10 ENCOUNTER — Other Ambulatory Visit: Payer: Self-pay

## 2018-11-10 ENCOUNTER — Encounter (HOSPITAL_COMMUNITY): Payer: Self-pay | Admitting: *Deleted

## 2018-11-10 DIAGNOSIS — Z9884 Bariatric surgery status: Secondary | ICD-10-CM | POA: Insufficient documentation

## 2018-11-10 DIAGNOSIS — Z79899 Other long term (current) drug therapy: Secondary | ICD-10-CM | POA: Diagnosis not present

## 2018-11-10 DIAGNOSIS — R202 Paresthesia of skin: Secondary | ICD-10-CM | POA: Diagnosis not present

## 2018-11-10 DIAGNOSIS — F17210 Nicotine dependence, cigarettes, uncomplicated: Secondary | ICD-10-CM | POA: Insufficient documentation

## 2018-11-10 DIAGNOSIS — M545 Low back pain, unspecified: Secondary | ICD-10-CM

## 2018-11-10 MED ORDER — METHOCARBAMOL 500 MG PO TABS: 500.0000 mg | ORAL_TABLET | Freq: Two times a day (BID) | ORAL | 0 refills | Status: DC

## 2018-11-10 NOTE — ED Triage Notes (Signed)
PT reports back pain that radiates into  bil legs with pins and needles  Sensation in her feet for 2 days. Pt denies any injury.

## 2018-11-10 NOTE — ED Provider Notes (Signed)
[facility] EMERGENCY DEPARTMENT Provider Note   CSN: [id number] Arrival date & time: 11/10/18  6203    History   Chief Complaint Chief Complaint  Patient presents with  . Back Pain    HPI Stacy [name] is a 38 y.o. female.     HPI   38 year old female presents today with complaints of back pain.  Patient has a 2-week history of low back pain.  She notes this started after waking up.  She notes this is in her lower back worse with movement and radiation down into her feet.  She notes some pins and needle sensation in her lower extremity.  She denies trauma, denies fever, denies IV drug use.  She denies any weakness in the lower extremities.  She notes using hot cold compress, Tylenol at home.    History reviewed. No pertinent past medical history.  Patient Active Problem List   Diagnosis Date Noted  . Closed fracture of distal end of left humerus 04/29/18 05/06/2018    Past Surgical History:  Procedure Laterality Date  . GASTRIC BYPASS    . TONSILLECTOMY       OB History   No obstetric history on file.      Home Medications    Prior to Admission medications   Medication Sig Start Date End Date Taking? Authorizing Provider  acetaminophen (TYLENOL) 500 MG tablet Take 1,000 mg by mouth every 6 (six) hours as needed for mild pain or moderate pain.    Yes [provider]  ferrous sulfate 325 (65 FE) MG tablet Take 325 mg by mouth daily with breakfast.   Yes [provider]  Multiple Vitamins-Minerals (ALIVE ONCE DAILY WOMENS PO) Take 1 tablet daily by mouth.   Yes [provider]  HYDROcodone-acetaminophen (NORCO/VICODIN) 5-325 MG tablet Take 2 tablets by mouth every 4 (four) hours as needed. Patient not taking: Reported on 11/10/2018 10/23/18   Vanetta Mulders, MD  methocarbamol (ROBAXIN) 500 MG tablet Take 1 tablet (500 mg total) by mouth 2 (two) times daily. 11/10/18   Eyvonne Mechanic, PA-C    Family History Family  History  Problem Relation Age of Onset  . Chronic infections Mother   . Healthy Father   . High blood pressure Maternal Grandfather   . Diabetes Maternal Grandfather   . Diabetes Paternal Grandmother   . Cancer Paternal Grandmother     Social History Social History   Tobacco Use  . Smoking status: Current Some Day Smoker    Packs/day: 0.50    Types: Cigarettes  . Smokeless tobacco: Never Used  . Tobacco comment: 4 cigarettes daily when she does smoke  Substance Use Topics  . Alcohol use: Yes    Comment: occ  . Drug use: No     Allergies   Ibuprofen   Review of Systems Review of Systems  All other systems reviewed and are negative.   Physical Exam Updated Vital Signs BP (!) 137/96 (BP Location: Right Arm)   Pulse (!) 116   Temp 98.9 F (37.2 C) (Oral)   Resp 17   Ht 5\' 2"  (1.575 m)   Wt 88.2 kg   SpO2 100%   BMI 35.55 kg/m   Physical Exam Vitals signs and nursing note reviewed.  Constitutional:      Appearance: She is well-developed.  HENT:     Head: Normocephalic and atraumatic.  Eyes:     General: No scleral icterus.       Right eye: No  discharge.        Left eye: No discharge.     Conjunctiva/sclera: Conjunctivae normal.     Pupils: Pupils are equal, round, and reactive to light.  Neck:     Musculoskeletal: Normal range of motion.     Vascular: No JVD.     Trachea: No tracheal deviation.  Pulmonary:     Effort: Pulmonary effort is normal.     Breath sounds: No stridor.  Musculoskeletal:     Comments: TTP of lower lumbar region diffusely- bilateral lower extremity sensation strength and motor function intact  Neurological:     Mental Status: She is alert and oriented to person, place, and time.     Coordination: Coordination normal.  Psychiatric:        Behavior: Behavior normal.        Thought Content: Thought content normal.        Judgment: Judgment normal.      ED Treatments / Results  Labs (all labs ordered are listed, but only  abnormal results are displayed) Labs Reviewed - No data to display  EKG None  Radiology No results found.  Procedures Procedures (including critical care time)  Medications Ordered in ED Medications - No data to display   Initial Impression / Assessment and Plan / ED Course  I have reviewed the triage vital signs and the nursing notes.  Pertinent labs & imaging results that were available during my care of the patient were reviewed by me and considered in my medical decision making (see chart for details).        Labs:   Imaging:  Consults:  Therapeutics:  Discharge Meds:   Assessment/Plan: 38 year old female presents today with complaints of back pain. No red flags. Will treat symptomatically and have outpatient follow-up. Return precautions given.     Final Clinical Impressions(s) / ED Diagnoses   Final diagnoses:  Acute midline low back pain without sciatica    ED Discharge Orders         Ordered    methocarbamol (ROBAXIN) 500 MG tablet  2 times daily     11/10/18 1011           Eyvonne Mechanic, Cordelia Poche 11/10/18 1309    Donnetta Hutching, MD 11/11/18 1026

## 2018-11-10 NOTE — ED Notes (Signed)
Patient verbalizes understanding of discharge instructions . Opportunity for questions and answers were provided . Armband removed by staff ,Pt discharged from ED. W/C  offered at D/C  and Declined W/C at D/C and was escorted to lobby by RN.  

## 2018-11-10 NOTE — Discharge Instructions (Addendum)
Please read attached information. If you experience any new or worsening signs or symptoms please return to the emergency room for evaluation. Please follow-up with your primary care provider or specialist as discussed. Please use medication prescribed only as directed and discontinue taking if you have any concerning signs or symptoms.   °

## 2018-11-11 ENCOUNTER — Telehealth: Payer: Self-pay | Admitting: Orthopedic Surgery

## 2018-11-11 NOTE — Telephone Encounter (Signed)
Patient called to set up an appointment to see Dr. Romeo Apple. She had multiple problems( back, legs and feet), I explained to her that with Medicaid we would need a authorization referral from her PCP. She stated that her PCP is in [place]. I explained again about Medicaid and told her that she may want to speak with her social worker and get her PCP changed to someone closer to her. I told her that our doctors only see for one problem at a time and that our doctors are not back specialist. I asked her what was the problem that was bothering her the most and was told it was her feet. I suggested she see her PCP because she may not have an orthopedic problem, she hung up on me.

## 2018-11-28 ENCOUNTER — Encounter (HOSPITAL_COMMUNITY): Payer: Self-pay | Admitting: *Deleted

## 2018-11-28 ENCOUNTER — Emergency Department (HOSPITAL_COMMUNITY): Payer: Medicaid Other

## 2018-11-28 ENCOUNTER — Other Ambulatory Visit: Payer: Self-pay

## 2018-11-28 ENCOUNTER — Emergency Department (HOSPITAL_COMMUNITY)
Admission: EM | Admit: 2018-11-28 | Discharge: 2018-11-28 | Disposition: A | Discharge status: Home / Self Care | Payer: Medicaid Other | Attending: Emergency Medicine | Admitting: Emergency Medicine

## 2018-11-28 DIAGNOSIS — F17210 Nicotine dependence, cigarettes, uncomplicated: Secondary | ICD-10-CM | POA: Insufficient documentation

## 2018-11-28 DIAGNOSIS — J069 Acute upper respiratory infection, unspecified: Secondary | ICD-10-CM | POA: Diagnosis not present

## 2018-11-28 DIAGNOSIS — Z79899 Other long term (current) drug therapy: Secondary | ICD-10-CM | POA: Insufficient documentation

## 2018-11-28 DIAGNOSIS — R05 Cough: Secondary | ICD-10-CM | POA: Diagnosis present

## 2018-11-28 DIAGNOSIS — R1110 Vomiting, unspecified: Secondary | ICD-10-CM | POA: Insufficient documentation

## 2018-11-28 DIAGNOSIS — J029 Acute pharyngitis, unspecified: Secondary | ICD-10-CM | POA: Insufficient documentation

## 2018-11-28 LAB — CBC WITH DIFFERENTIAL/PLATELET
Abs Immature Granulocytes: 0.02 10*3/uL (ref 0.00–0.07)
Basophils Absolute: 0 10*3/uL (ref 0.0–0.1)
Basophils Relative: 1 %
Eosinophils Absolute: 0.1 10*3/uL (ref 0.0–0.5)
Eosinophils Relative: 1 %
HCT: 38.4 % (ref 36.0–46.0)
Hemoglobin: 12.7 g/dL (ref 12.0–15.0)
Immature Granulocytes: 1 %
Lymphocytes Relative: 33 %
Lymphs Abs: 1.2 10*3/uL (ref 0.7–4.0)
MCH: 33.8 pg (ref 26.0–34.0)
MCHC: 33.1 g/dL (ref 30.0–36.0)
MCV: 102.1 fL — ABNORMAL HIGH (ref 80.0–100.0)
Monocytes Absolute: 0.4 10*3/uL (ref 0.1–1.0)
Monocytes Relative: 11 %
Neutro Abs: 2 10*3/uL (ref 1.7–7.7)
Neutrophils Relative %: 53 %
Platelets: 194 10*3/uL (ref 150–400)
RBC: 3.76 MIL/uL — ABNORMAL LOW (ref 3.87–5.11)
RDW: 15.7 % — ABNORMAL HIGH (ref 11.5–15.5)
WBC: 3.7 10*3/uL — ABNORMAL LOW (ref 4.0–10.5)
nRBC: 0 % (ref 0.0–0.2)

## 2018-11-28 LAB — BASIC METABOLIC PANEL
Anion gap: 13 (ref 5–15)
BUN: 5 mg/dL — ABNORMAL LOW (ref 6–20)
CO2: 21 mmol/L — ABNORMAL LOW (ref 22–32)
Calcium: 9.3 mg/dL (ref 8.9–10.3)
Chloride: 102 mmol/L (ref 98–111)
Creatinine, Ser: 0.78 mg/dL (ref 0.44–1.00)
GFR calc Af Amer: >60 mL/min (ref >60)
GFR calc non Af Amer: >60 mL/min (ref >60)
Glucose, Bld: 89 mg/dL (ref 70–99)
Potassium: 4.2 mmol/L (ref 3.5–5.1)
Sodium: 136 mmol/L (ref 135–145)

## 2018-11-28 MED ORDER — ONDANSETRON HCL 4 MG/2ML IJ SOLN
4.0000 mg | Freq: Once | INTRAMUSCULAR | Status: AC
  Administered 2018-11-28: 4 mg via INTRAVENOUS
  Filled 2018-11-28: qty 2

## 2018-11-28 MED ORDER — PROMETHAZINE-DM 6.25-15 MG/5ML PO SYRP: 5.0000 mL | ORAL_SOLUTION | Freq: Four times a day (QID) | ORAL | 0 refills | Status: DC | PRN

## 2018-11-28 MED ORDER — SODIUM CHLORIDE 0.9 % IV BOLUS
1000.0000 mL | Freq: Once | INTRAVENOUS | Status: AC
  Administered 2018-11-28: 1000 mL via INTRAVENOUS

## 2018-11-28 MED ORDER — GUAIFENESIN ER 1200 MG PO TB12: 1.0000 | ORAL_TABLET | Freq: Two times a day (BID) | ORAL | 0 refills | Status: DC

## 2018-11-28 NOTE — ED Triage Notes (Signed)
PT reports  Cough and sore throat . Pt denies fever . Pt last seen on 3 /24 with pins and needle  sensations in both feet . Pt  Reports feet have not improved.

## 2018-11-28 NOTE — ED Triage Notes (Signed)
PT reports working at [employer] but has been off for 1 1/2 weeks . Pt denies being sent home for cough and sore throat. Pt denies fever.

## 2018-11-28 NOTE — ED Triage Notes (Signed)
Attempted to start IV  , Pt difficult vein access. IV team consult placed

## 2018-11-28 NOTE — ED Provider Notes (Signed)
[facility] EMERGENCY DEPARTMENT Provider Note   CSN: [id number] Arrival date & time: 11/28/18  0945    History   Chief Complaint Chief Complaint  Patient presents with  . Cough  . Sore Throat    HPI Stacy [name] is a 38 y.o. female.     HPI Patient presents to the emergency department with cough and vomiting over the last week.  The patient states that she seems to vomit more after coughing.  She states that nothing seems make the condition better or worse.  She states her throat has been irritated and some nasal drainage as well.  She states she did take some Tylenol for some cramping in her feet this morning.  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  Patient does not think she is had any fever.  The patient denies chest pain, shortness of breath, headache,blurred vision, neck pain, fever,  weakness, numbness, dizziness, anorexia, edema, abdominal pain, nausea, vomiting, diarrhea, rash, back pain, dysuria, hematemesis, bloody stool, near syncope, or syncope. History reviewed. No pertinent past medical history.  Patient Active Problem List   Diagnosis Date Noted  . Closed fracture of distal end of left humerus 04/29/18 05/06/2018    Past Surgical History:  Procedure Laterality Date  . GASTRIC BYPASS    . TONSILLECTOMY       OB History   No obstetric history on file.      Home Medications    Prior to Admission medications   Medication Sig Start Date End Date Taking? Authorizing Provider  acetaminophen (TYLENOL) 500 MG tablet Take 1,000 mg by mouth every 6 (six) hours as needed for mild pain or moderate pain.     [provider]  ferrous sulfate 325 (65 FE) MG tablet Take 325 mg by mouth daily with breakfast.    [provider]  HYDROcodone-acetaminophen (NORCO/VICODIN) 5-325 MG tablet Take 2 tablets by mouth every 4 (four) hours as needed. Patient not  taking: Reported on 11/10/2018 10/23/18   Vanetta Mulders, MD  methocarbamol (ROBAXIN) 500 MG tablet Take 1 tablet (500 mg total) by mouth 2 (two) times daily. 11/10/18   Hedges, Tinnie Gens, PA-C  Multiple Vitamins-Minerals (ALIVE ONCE DAILY WOMENS PO) Take 1 tablet daily by mouth.    [provider]    Family History Family History  Problem Relation Age of Onset  . Chronic infections Mother   . Healthy Father   . High blood pressure Maternal Grandfather   . Diabetes Maternal Grandfather   . Diabetes Paternal Grandmother   . Cancer Paternal Grandmother     Social History Social History   Tobacco Use  . Smoking status: Current Some Day Smoker    Packs/day: 0.50    Types: Cigarettes  . Smokeless tobacco: Never Used  . Tobacco comment: 4 cigarettes daily when she does smoke  Substance Use Topics  . Alcohol use: Yes    Comment: occ  . Drug use: No     Allergies   Ibuprofen   Review of Systems Review of Systems All other systems negative except as documented in the HPI. All pertinent positives and negatives as reviewed in the HPI.  Physical Exam Updated Vital Signs BP (!) 144/108 (BP Location: Right Arm)   Pulse (!) 129   Temp 98.2 F (36.8 C) (Oral)   Resp 16   Ht 5\' 2"  (1.575 m)   Wt 88 kg   SpO2  100%   BMI 35.48 kg/m   Physical Exam Vitals signs and nursing note reviewed.  Constitutional:      General: She is not in acute distress.    Appearance: She is well-developed.  HENT:     Head: Normocephalic and atraumatic.  Eyes:     Pupils: Pupils are equal, round, and reactive to light.  Neck:     Musculoskeletal: Normal range of motion and neck supple.  Cardiovascular:     Rate and Rhythm: Regular rhythm. Tachycardia present.     Heart sounds: Normal heart sounds. No murmur. No friction rub. No gallop.   Pulmonary:     Effort: Pulmonary effort is normal. No respiratory distress.     Breath sounds: Normal breath sounds. No wheezing.  Abdominal:      General: Bowel sounds are normal. There is no distension.     Palpations: Abdomen is soft.     Tenderness: There is no abdominal tenderness.  Skin:    General: Skin is warm and dry.     Capillary Refill: Capillary refill takes less than 2 seconds.     Findings: No erythema or rash.  Neurological:     Mental Status: She is alert and oriented to person, place, and time.     Motor: No abnormal muscle tone.     Coordination: Coordination normal.  Psychiatric:        Behavior: Behavior normal.      ED Treatments / Results  Labs (all labs ordered are listed, but only abnormal results are displayed) Labs Reviewed  BASIC METABOLIC PANEL - Abnormal; Notable for the following components:      Result Value   CO2 21 (*)    BUN 5 (*)    All other components within normal limits  CBC WITH DIFFERENTIAL/PLATELET - Abnormal; Notable for the following components:   WBC 3.7 (*)    RBC 3.76 (*)    MCV 102.1 (*)    RDW 15.7 (*)    All other components within normal limits    EKG None  Radiology Dg Chest 2 View  Result Date: 11/28/2018 CLINICAL DATA:  Acute onset of cough and sore throat. Paresthesias involving both feet which has been going on for approximately 2 weeks. Current smoker. EXAM: CHEST - 2 VIEW COMPARISON:  10/23/2018 and earlier, including CTA chest 06/18/2018. FINDINGS: Cardiomediastinal silhouette unremarkable and unchanged. Lungs clear. Bronchovascular markings normal. Pulmonary vascularity normal. No visible pleural effusions. No pneumothorax. Minimal degenerative changes involving the thoracic spine. No interval change. IMPRESSION: No acute cardiopulmonary disease. Stable examination. Electronically Signed   By: Hulan Saashomas  Lawrence M.D.   On: 11/28/2018 10:53    Procedures Procedures (including critical care time)  Medications Ordered in ED Medications  sodium chloride 0.9 % bolus 1,000 mL (1,000 mLs Intravenous New Bag/Given 11/28/18 1157)  ondansetron (ZOFRAN) injection 4  mg (4 mg Intravenous Given 11/28/18 1202)  sodium chloride 0.9 % bolus 1,000 mL (1,000 mLs Intravenous New Bag/Given 11/28/18 1326)     Initial Impression / Assessment and Plan / ED Course  I have reviewed the triage vital signs and the nursing notes.  Pertinent labs & imaging results that were available during my care of the patient were reviewed by me and considered in my medical decision making (see chart for details).       The patient will be treated as a presumptive coronavirus even though she does not have any fever and does appear well-appearing.  She is feeling better following  the IV fluids.  We will have the patient self quarantine another 7 days to further care for her symptoms.  I did advise her to return here for any worsening her condition.  Patient voiced an understanding the plan and all questions were answered. Final Clinical Impressions(s) / ED Diagnoses   Final diagnoses:  None    ED Discharge Orders    None       Charlestine Night, Cordelia Poche 11/28/18 1356    Tilden Fossa, MD 11/29/18 1210

## 2018-11-28 NOTE — Discharge Instructions (Signed)
You will need to quarantine for the next 7 days.  Minimize exposure to others as much as possible.  Return here for any worsening in your condition.  Increase your fluid intake.  Rest as much as possible.

## 2018-12-22 ENCOUNTER — Emergency Department (HOSPITAL_COMMUNITY): Payer: Medicaid Other

## 2018-12-22 ENCOUNTER — Other Ambulatory Visit: Payer: Self-pay

## 2018-12-22 ENCOUNTER — Emergency Department (HOSPITAL_COMMUNITY)
Admission: EM | Admit: 2018-12-22 | Discharge: 2018-12-22 | Disposition: A | Discharge status: Home / Self Care | Payer: Medicaid Other | Attending: Emergency Medicine | Admitting: Emergency Medicine

## 2018-12-22 DIAGNOSIS — R748 Abnormal levels of other serum enzymes: Secondary | ICD-10-CM | POA: Insufficient documentation

## 2018-12-22 DIAGNOSIS — F17210 Nicotine dependence, cigarettes, uncomplicated: Secondary | ICD-10-CM | POA: Diagnosis not present

## 2018-12-22 DIAGNOSIS — K852 Alcohol induced acute pancreatitis without necrosis or infection: Secondary | ICD-10-CM

## 2018-12-22 DIAGNOSIS — Z79899 Other long term (current) drug therapy: Secondary | ICD-10-CM | POA: Diagnosis not present

## 2018-12-22 DIAGNOSIS — R1011 Right upper quadrant pain: Secondary | ICD-10-CM

## 2018-12-22 DIAGNOSIS — R200 Anesthesia of skin: Secondary | ICD-10-CM | POA: Diagnosis present

## 2018-12-22 DIAGNOSIS — R1110 Vomiting, unspecified: Secondary | ICD-10-CM | POA: Insufficient documentation

## 2018-12-22 DIAGNOSIS — E538 Deficiency of other specified B group vitamins: Secondary | ICD-10-CM | POA: Diagnosis not present

## 2018-12-22 DIAGNOSIS — R945 Abnormal results of liver function studies: Secondary | ICD-10-CM

## 2018-12-22 DIAGNOSIS — M5489 Other dorsalgia: Secondary | ICD-10-CM | POA: Diagnosis not present

## 2018-12-22 DIAGNOSIS — R7989 Other specified abnormal findings of blood chemistry: Secondary | ICD-10-CM

## 2018-12-22 LAB — CBC WITH DIFFERENTIAL/PLATELET
Abs Immature Granulocytes: 0.04 10*3/uL (ref 0.00–0.07)
Basophils Absolute: 0 10*3/uL (ref 0.0–0.1)
Basophils Relative: 0 %
Eosinophils Absolute: 0 10*3/uL (ref 0.0–0.5)
Eosinophils Relative: 1 %
HCT: 32.9 % — ABNORMAL LOW (ref 36.0–46.0)
Hemoglobin: 11.2 g/dL — ABNORMAL LOW (ref 12.0–15.0)
Immature Granulocytes: 1 %
Lymphocytes Relative: 18 %
Lymphs Abs: 0.9 10*3/uL (ref 0.7–4.0)
MCH: 33.4 pg (ref 26.0–34.0)
MCHC: 34 g/dL (ref 30.0–36.0)
MCV: 98.2 fL (ref 80.0–100.0)
Monocytes Absolute: 0.3 10*3/uL (ref 0.1–1.0)
Monocytes Relative: 5 %
Neutro Abs: 4 10*3/uL (ref 1.7–7.7)
Neutrophils Relative %: 75 %
Platelets: 172 10*3/uL (ref 150–400)
RBC: 3.35 MIL/uL — ABNORMAL LOW (ref 3.87–5.11)
RDW: 17.2 % — ABNORMAL HIGH (ref 11.5–15.5)
WBC: 5.3 10*3/uL (ref 4.0–10.5)
nRBC: 0.6 % — ABNORMAL HIGH (ref 0.0–0.2)

## 2018-12-22 LAB — FOLATE: Folate: 2.7 ng/mL — ABNORMAL LOW (ref >5.9)

## 2018-12-22 LAB — COMPREHENSIVE METABOLIC PANEL
ALT: 207 U/L — ABNORMAL HIGH (ref 0–44)
AST: 404 U/L — ABNORMAL HIGH (ref 15–41)
Albumin: 3.7 g/dL (ref 3.5–5.0)
Alkaline Phosphatase: 155 U/L — ABNORMAL HIGH (ref 38–126)
Anion gap: 15 (ref 5–15)
BUN: 5 mg/dL — ABNORMAL LOW (ref 6–20)
CO2: 22 mmol/L (ref 22–32)
Calcium: 9.2 mg/dL (ref 8.9–10.3)
Chloride: 98 mmol/L (ref 98–111)
Creatinine, Ser: 0.85 mg/dL (ref 0.44–1.00)
GFR calc Af Amer: >60 mL/min (ref >60)
GFR calc non Af Amer: >60 mL/min (ref >60)
Glucose, Bld: 91 mg/dL (ref 70–99)
Potassium: 4.4 mmol/L (ref 3.5–5.1)
Sodium: 135 mmol/L (ref 135–145)
Total Bilirubin: 2.7 mg/dL — ABNORMAL HIGH (ref 0.3–1.2)
Total Protein: 7.2 g/dL (ref 6.5–8.1)

## 2018-12-22 LAB — I-STAT BETA HCG BLOOD, ED (MC, WL, AP ONLY): I-stat hCG, quantitative: <5 m[IU]/mL (ref <5)

## 2018-12-22 LAB — LIPASE, BLOOD: Lipase: 205 U/L — ABNORMAL HIGH (ref 11–51)

## 2018-12-22 LAB — VITAMIN B12: Vitamin B-12: 405 pg/mL (ref 180–914)

## 2018-12-22 MED ORDER — OXYCODONE-ACETAMINOPHEN 5-325 MG PO TABS: 1.0000 | ORAL_TABLET | Freq: Four times a day (QID) | ORAL | 0 refills | Status: DC | PRN

## 2018-12-22 MED ORDER — FOLIC ACID 1 MG PO TABS
1.0000 mg | ORAL_TABLET | Freq: Once | ORAL | Status: AC
  Administered 2018-12-22: 1 mg via ORAL
  Filled 2018-12-22: qty 1

## 2018-12-22 MED ORDER — ONDANSETRON HCL 4 MG/2ML IJ SOLN
4.0000 mg | Freq: Once | INTRAMUSCULAR | Status: AC
  Administered 2018-12-22: 15:00:00 4 mg via INTRAVENOUS
  Filled 2018-12-22: qty 2

## 2018-12-22 MED ORDER — SODIUM CHLORIDE 0.9 % IV BOLUS
1000.0000 mL | Freq: Once | INTRAVENOUS | Status: AC
  Administered 2018-12-22: 1000 mL via INTRAVENOUS

## 2018-12-22 MED ORDER — MORPHINE SULFATE (PF) 4 MG/ML IV SOLN
4.0000 mg | Freq: Once | INTRAVENOUS | Status: AC
  Administered 2018-12-22: 4 mg via INTRAVENOUS
  Filled 2018-12-22: qty 1

## 2018-12-22 MED ORDER — ONDANSETRON 4 MG PO TBDP
4.0000 mg | ORAL_TABLET | Freq: Once | ORAL | Status: AC
  Administered 2018-12-22: 4 mg via ORAL
  Filled 2018-12-22: qty 1

## 2018-12-22 MED ORDER — ONDANSETRON HCL 4 MG PO TABS: 4.0000 mg | ORAL_TABLET | Freq: Four times a day (QID) | ORAL | 0 refills | Status: DC

## 2018-12-22 NOTE — ED Triage Notes (Signed)
Pt here for evaluation of back pain, abdominal pain, N/V, and bilateral LE numbness and pain x several weeks. Has been taking Motrin and Tylenol without relief.

## 2018-12-22 NOTE — ED Notes (Signed)
Discharge instructions and prescriptions discussed with Pt. Pt verbalized understanding. Pt stable and ambulatory.   

## 2018-12-22 NOTE — Discharge Instructions (Signed)
Please read the attached information.  Please avoid alcohol.  As we discussed your folate level is low, the multivitamin that you prescribed has sufficient folate in it please resume taking this medication.  If you are unable to eat or drink, you have worsening pain or any new or worsening signs or symptoms return to the emergency room.  Please contact your primary care provider and let them know of the labs that are pending for evaluation.

## 2018-12-22 NOTE — ED Provider Notes (Signed)
[facility] EMERGENCY DEPARTMENT Provider Note   CSN: [id number] Arrival date & time: 12/22/18  1059    History   Chief Complaint Chief Complaint  Patient presents with   Numbness   Emesis   Back Pain   Abdominal Pain    HPI Stacy [name] is a 38 y.o. female.     HPI   38 year old female presents today with numerous complaints.  Patient notes over the last 2 months she has had numbness and tingling in her bilateral legs and feet.  She notes this is from her thighs down to her feet.  She denies any acute weakness in the lower extremity.  She denies any trauma, notes this was progressive in nature.  She notes intermittent back pain, none presently.  She reports she has been seen for follow-up of this and was told she has neuropathy but has no further information on this.  She notes at that visit they did test her vitamin D level but is uncertain what other levels were drawn.  She notes that she has not been taking her multivitamins for the last several months.  She denies any history of diabetes.  Patient also notes that she drinks alcohol on a regular basis noting she drinks beers every other day.  She notes she was drinking yesterday.  Woke up in the middle the night with epigastric abdominal pain nausea and vomiting.  She denies any diarrhea lower abdominal pain vaginal discharge or bleeding.  She notes this has been coming and going over the last several weeks.  She notes a history of elevated liver function tests likely secondary to alcohol intake.  She denies any close sick contacts, no known coronavirus exposure.  She notes state back pain.  Patient denies any back pain presently, notes that this was previous back pain.      No past medical history on file.  Patient Active Problem List   Diagnosis Date Noted   Closed fracture of distal end of left humerus 04/29/18 05/06/2018    Past Surgical History:  Procedure Laterality Date   GASTRIC  BYPASS     TONSILLECTOMY       OB History   No obstetric history on file.      Home Medications    Prior to Admission medications   Medication Sig Start Date End Date Taking? Authorizing Provider  acetaminophen (TYLENOL) 500 MG tablet Take 1,000 mg by mouth every 6 (six) hours as needed for mild pain or moderate pain.     [provider]  ferrous sulfate 325 (65 FE) MG tablet Take 325 mg by mouth daily with breakfast.    [provider]  Guaifenesin 1200 MG TB12 Take 1 tablet (1,200 mg total) by mouth 2 (two) times daily. 11/28/18   Lawyer, Cristal Deer, PA-C  HYDROcodone-acetaminophen (NORCO/VICODIN) 5-325 MG tablet Take 2 tablets by mouth every 4 (four) hours as needed. Patient not taking: Reported on 11/10/2018 10/23/18   Vanetta Mulders, MD  methocarbamol (ROBAXIN) 500 MG tablet Take 1 tablet (500 mg total) by mouth 2 (two) times daily. 11/10/18   Trenisha Lafavor, Tinnie Gens, PA-C  Multiple Vitamins-Minerals (ALIVE ONCE DAILY WOMENS PO) Take 1 tablet daily by mouth.    [provider]  ondansetron (ZOFRAN) 4 MG tablet Take 1 tablet (4 mg total) by mouth every 6 (six) hours. 12/22/18   Starkeisha Vanwinkle, Tinnie Gens, PA-C  oxyCODONE-acetaminophen (PERCOCET/ROXICET) 5-325 MG tablet Take 1 tablet by mouth every 6 (six) hours as needed for severe pain.  12/22/18   Amel Kitch, Tinnie Gens, PA-C  promethazine-dextromethorphan (PROMETHAZINE-DM) 6.25-15 MG/5ML syrup Take 5 mLs by mouth 4 (four) times daily as needed for cough. 11/28/18   Lawyer, Cristal Deer, PA-C    Family History Family History  Problem Relation Age of Onset   Chronic infections Mother    Healthy Father    High blood pressure Maternal Grandfather    Diabetes Maternal Grandfather    Diabetes Paternal Grandmother    Cancer Paternal Grandmother     Social History Social History   Tobacco Use   Smoking status: Current Some Day Smoker    Packs/day: 0.50    Types: Cigarettes   Smokeless tobacco: Never Used   Tobacco  comment: 4 cigarettes daily when she does smoke  Substance Use Topics   Alcohol use: Yes    Comment: occ   Drug use: No     Allergies   Ibuprofen   Review of Systems Review of Systems  All other systems reviewed and are negative.   Physical Exam Updated Vital Signs BP (!) 122/94    Pulse (!) 115    Temp 97.9 F (36.6 C) (Oral)    Ht 5\' 2"  (1.575 m)    Wt 84.8 kg    SpO2 98%    BMI 34.20 kg/m   Physical Exam Vitals signs and nursing note reviewed.  Constitutional:      Appearance: She is well-developed.  HENT:     Head: Normocephalic and atraumatic.  Eyes:     General: No scleral icterus.       Right eye: No discharge.        Left eye: No discharge.     Conjunctiva/sclera: Conjunctivae normal.     Pupils: Pupils are equal, round, and reactive to light.  Neck:     Musculoskeletal: Normal range of motion.     Vascular: No JVD.     Trachea: No tracheal deviation.  Pulmonary:     Effort: Pulmonary effort is normal.     Breath sounds: No stridor.  Abdominal:     Comments: Nondistended abdomen tenderness palpation of the epigastric region, remainder of abdomen soft nontender  Musculoskeletal:     Comments: Distal sensation reduced in the bilateral lower extremities diffusely from the mid thigh down she has pedal pulses bilateral cap refill intact strength 5 out of 5  No CT or L-spine tenderness palpation, back atraumatic no swelling edema or redness.  Neurological:     Mental Status: She is alert and oriented to person, place, and time.     Coordination: Coordination normal.  Psychiatric:        Behavior: Behavior normal.        Thought Content: Thought content normal.        Judgment: Judgment normal.      ED Treatments / Results  Labs (all labs ordered are listed, but only abnormal results are displayed) Labs Reviewed  CBC WITH DIFFERENTIAL/PLATELET - Abnormal; Notable for the following components:      Result Value   RBC 3.35 (*)    Hemoglobin 11.2 (*)     HCT 32.9 (*)    RDW 17.2 (*)    nRBC 0.6 (*)    All other components within normal limits  COMPREHENSIVE METABOLIC PANEL - Abnormal; Notable for the following components:   BUN 5 (*)    AST 404 (*)    ALT 207 (*)    Alkaline Phosphatase 155 (*)    Total Bilirubin 2.7 (*)  All other components within normal limits  LIPASE, BLOOD - Abnormal; Notable for the following components:   Lipase 205 (*)    All other components within normal limits  FOLATE - Abnormal; Notable for the following components:   Folate 2.7 (*)    All other components within normal limits  VITAMIN B12  VITAMIN D 25 HYDROXY (VIT D DEFICIENCY, FRACTURES)  I-STAT BETA HCG BLOOD, ED ([facility], [facility], [facility] ONLY)    EKG None  Radiology US Abdomen Limited Ruq  Result Date: 12/22/2018 CLINICAL DATA:  Right upper quadrant pain EXAM: ULTRASOUND ABDOMEN LIMITED RIGHT UPPER QUADRANT COMPARISON:  None. FINDINGS: Gallbladder: No gallstones or wall thickening visualized. No sonographic Murphy sign noted by sonographer. Common bile duct: Diameter: 3.9 mm Liver: No focal lesion identified. Increased hepatic parenchymal echogenicity. Portal vein is patent on color Doppler imaging with normal direction of blood flow towards the liver. IMPRESSION: 1. No cholelithiasis or sonographic evidence of acute cholecystitis. 2. Increased hepatic echogenicity as can be seen with hepatic steatosis. Electronically Signed   By: Elige Ko   On: 12/22/2018 13:53    Procedures Procedures (including critical care time)  Medications Ordered in ED Medications  folic acid (FOLVITE) tablet 1 mg (has no administration in time range)  ondansetron (ZOFRAN-ODT) disintegrating tablet 4 mg (4 mg Oral Given 12/22/18 1136)  sodium chloride 0.9 % bolus 1,000 mL (1,000 mLs Intravenous New Bag/Given 12/22/18 1349)  ondansetron (ZOFRAN) injection 4 mg (4 mg Intravenous Given 12/22/18 1455)  morphine 4 MG/ML injection 4 mg (4 mg Intravenous Given 12/22/18 1454)      Initial Impression / Assessment and Plan / ED Course  I have reviewed the triage vital signs and the nursing notes.  Pertinent labs & imaging results that were available during my care of the patient were reviewed by me and considered in my medical decision making (see chart for details).       Labs: I-STAT beta-hCG, folate, VIT B12    Assessment/Plan: 38 year old female presents today with most likely pancreatitis.  She was given fluids here pain medicine.  She is tolerating p.o. no acute distress.  She does not look ill.  She is stable for outpatient management.  I did discuss the need for close follow-up with her primary care for continuation of evaluation for elevated liver function tests.  Given her paresthesias additional labs were sent she will follow-up closely with her primary care for results and management of these.  I had an extensive discussion on alcohol use and the need for her to quit using alcohol.  Patient is stable for outpatient management, she is given strict return precautions.  She verbalized understanding and agreement to today's plan.  Patient's folate level was noted to be low here.  She does have multivitamins at home that have sufficient levels of folate.  She is encouraged to resume taking these medications.  Uncertain if this is the cause of her paresthesia she will need ongoing evaluation and management.      Final Clinical Impressions(s) / ED Diagnoses   Final diagnoses:  RUQ abdominal pain  Folic acid deficiency  Alcohol-induced acute pancreatitis, unspecified complication status  Elevated liver function tests    ED Discharge Orders         Ordered    oxyCODONE-acetaminophen (PERCOCET/ROXICET) 5-325 MG tablet  Every 6 hours PRN     12/22/18 1508    ondansetron (ZOFRAN) 4 MG tablet  Every 6 hours     12/22/18 1508  Eyvonne MechanicHedges, Quintavious Rinck, PA-C 12/22/18 1519    Gwyneth SproutPlunkett, Whitney, MD 12/22/18 1555

## 2018-12-23 LAB — VITAMIN D 25 HYDROXY (VIT D DEFICIENCY, FRACTURES): Vit D, 25-Hydroxy: 5.2 ng/mL — ABNORMAL LOW (ref 30.0–100.0)

## 2019-01-01 ENCOUNTER — Other Ambulatory Visit: Payer: Self-pay

## 2019-01-01 ENCOUNTER — Ambulatory Visit: Payer: Medicaid Other | Attending: Internal Medicine | Admitting: Internal Medicine

## 2019-01-01 DIAGNOSIS — Z5329 Procedure and treatment not carried out because of patient's decision for other reasons: Secondary | ICD-10-CM

## 2019-01-01 DIAGNOSIS — Z91199 Patient's noncompliance with other medical treatment and regimen due to unspecified reason: Secondary | ICD-10-CM

## 2019-01-01 NOTE — Progress Notes (Signed)
Nurse called the patient's home phone number but received no answer and message was left on the voicemail for the patient to call back.  Return phone number given.  Patient called at   1. 1413hrs 2.  1422hrs 3.  1434hrs  x3 calls

## 2019-01-05 ENCOUNTER — Encounter (HOSPITAL_COMMUNITY): Payer: Self-pay | Admitting: Emergency Medicine

## 2019-01-05 ENCOUNTER — Other Ambulatory Visit: Payer: Self-pay

## 2019-01-05 ENCOUNTER — Emergency Department (HOSPITAL_COMMUNITY)
Admission: EM | Admit: 2019-01-05 | Discharge: 2019-01-06 | Disposition: A | Discharge status: Home / Self Care | Payer: Medicaid Other | Attending: Emergency Medicine | Admitting: Emergency Medicine

## 2019-01-05 DIAGNOSIS — Z6834 Body mass index (BMI) 34.0-34.9, adult: Secondary | ICD-10-CM | POA: Insufficient documentation

## 2019-01-05 DIAGNOSIS — Z79899 Other long term (current) drug therapy: Secondary | ICD-10-CM | POA: Diagnosis not present

## 2019-01-05 DIAGNOSIS — G629 Polyneuropathy, unspecified: Secondary | ICD-10-CM | POA: Diagnosis not present

## 2019-01-05 DIAGNOSIS — K5900 Constipation, unspecified: Secondary | ICD-10-CM | POA: Insufficient documentation

## 2019-01-05 DIAGNOSIS — E669 Obesity, unspecified: Secondary | ICD-10-CM | POA: Insufficient documentation

## 2019-01-05 DIAGNOSIS — R110 Nausea: Secondary | ICD-10-CM | POA: Diagnosis present

## 2019-01-05 DIAGNOSIS — R000 Tachycardia, unspecified: Secondary | ICD-10-CM | POA: Insufficient documentation

## 2019-01-05 DIAGNOSIS — F17210 Nicotine dependence, cigarettes, uncomplicated: Secondary | ICD-10-CM | POA: Insufficient documentation

## 2019-01-05 LAB — CBC
HCT: 34.5 % — ABNORMAL LOW (ref 36.0–46.0)
Hemoglobin: 11.4 g/dL — ABNORMAL LOW (ref 12.0–15.0)
MCH: 33.1 pg (ref 26.0–34.0)
MCHC: 33 g/dL (ref 30.0–36.0)
MCV: 100.3 fL — ABNORMAL HIGH (ref 80.0–100.0)
Platelets: 409 10*3/uL — ABNORMAL HIGH (ref 150–400)
RBC: 3.44 MIL/uL — ABNORMAL LOW (ref 3.87–5.11)
RDW: 17.2 % — ABNORMAL HIGH (ref 11.5–15.5)
WBC: 7.4 10*3/uL (ref 4.0–10.5)
nRBC: 0 % (ref 0.0–0.2)

## 2019-01-05 LAB — COMPREHENSIVE METABOLIC PANEL
ALT: 77 U/L — ABNORMAL HIGH (ref 0–44)
AST: 79 U/L — ABNORMAL HIGH (ref 15–41)
Albumin: 3.5 g/dL (ref 3.5–5.0)
Alkaline Phosphatase: 115 U/L (ref 38–126)
Anion gap: 13 (ref 5–15)
BUN: 6 mg/dL (ref 6–20)
CO2: 21 mmol/L — ABNORMAL LOW (ref 22–32)
Calcium: 9.5 mg/dL (ref 8.9–10.3)
Chloride: 102 mmol/L (ref 98–111)
Creatinine, Ser: 0.73 mg/dL (ref 0.44–1.00)
GFR calc Af Amer: >60 mL/min (ref >60)
GFR calc non Af Amer: >60 mL/min (ref >60)
Glucose, Bld: 93 mg/dL (ref 70–99)
Potassium: 3.7 mmol/L (ref 3.5–5.1)
Sodium: 136 mmol/L (ref 135–145)
Total Bilirubin: 1.4 mg/dL — ABNORMAL HIGH (ref 0.3–1.2)
Total Protein: 7.3 g/dL (ref 6.5–8.1)

## 2019-01-05 LAB — URINALYSIS, ROUTINE W REFLEX MICROSCOPIC
Bilirubin Urine: NEGATIVE
Glucose, UA: NEGATIVE mg/dL
Hgb urine dipstick: NEGATIVE
Ketones, ur: NEGATIVE mg/dL
Leukocytes,Ua: NEGATIVE
Nitrite: NEGATIVE
Protein, ur: NEGATIVE mg/dL
Specific Gravity, Urine: 1.009 (ref 1.005–1.030)
pH: 5 (ref 5.0–8.0)

## 2019-01-05 LAB — I-STAT BETA HCG BLOOD, ED (MC, WL, AP ONLY): I-stat hCG, quantitative: <5 m[IU]/mL (ref <5)

## 2019-01-05 LAB — LIPASE, BLOOD: Lipase: 83 U/L — ABNORMAL HIGH (ref 11–51)

## 2019-01-05 MED ORDER — ONDANSETRON HCL 4 MG/2ML IJ SOLN
4.0000 mg | Freq: Once | INTRAMUSCULAR | Status: AC
  Administered 2019-01-06: 4 mg via INTRAVENOUS
  Filled 2019-01-05: qty 2

## 2019-01-05 MED ORDER — ACETAMINOPHEN 325 MG PO TABS
650.0000 mg | ORAL_TABLET | Freq: Once | ORAL | Status: AC
  Administered 2019-01-06: 650 mg via ORAL
  Filled 2019-01-05: qty 2

## 2019-01-05 MED ORDER — SODIUM CHLORIDE 0.9 % IV BOLUS
1000.0000 mL | Freq: Once | INTRAVENOUS | Status: AC
  Administered 2019-01-06: 1000 mL via INTRAVENOUS

## 2019-01-05 NOTE — ED Triage Notes (Signed)
Pt complains of generalized body pain, nausea. Pt denies fever.

## 2019-01-06 ENCOUNTER — Emergency Department (HOSPITAL_COMMUNITY): Payer: Medicaid Other

## 2019-01-06 LAB — ETHANOL: Alcohol, Ethyl (B): 27 mg/dL — ABNORMAL HIGH (ref <10)

## 2019-01-06 LAB — TSH: TSH: 3.74 u[IU]/mL (ref 0.350–4.500)

## 2019-01-06 MED ORDER — ONDANSETRON 4 MG PO TBDP: 4.0000 mg | ORAL_TABLET | Freq: Three times a day (TID) | ORAL | 0 refills | Status: DC | PRN

## 2019-01-06 MED ORDER — LORAZEPAM 2 MG/ML IJ SOLN
1.0000 mg | Freq: Once | INTRAMUSCULAR | Status: AC
  Administered 2019-01-06: 1 mg via INTRAVENOUS
  Filled 2019-01-06: qty 1

## 2019-01-06 MED ORDER — SODIUM CHLORIDE 0.9 % IV BOLUS
1000.0000 mL | Freq: Once | INTRAVENOUS | Status: AC
  Administered 2019-01-06: 1000 mL via INTRAVENOUS

## 2019-01-06 NOTE — Discharge Instructions (Signed)
You were seen today for nausea and foot pain.  Your work-up is reassuring.  Follow-up with your primary physician.  Of note, you were noted to have a high heart rate.  If you develop chest pain, palpitations, shortness of breath you should be reevaluated immediately.

## 2019-01-06 NOTE — ED Provider Notes (Signed)
[facility] EMERGENCY DEPARTMENT Provider Note   CSN: [id number] Arrival date & time: 01/05/19  1831    History   Chief Complaint Chief Complaint  Patient presents with  . Nausea  . Generalized Body Aches    HPI Stacy LORING is a 38 y.o. female.     HPI  This is a 38 year old female with no significant past medical history who presents with nausea and foot pain.  Patient reports nausea over the last 24 hours.  She denies any abdominal pain or actual vomiting.  Last normal bowel movement was 3 days ago.  She states "I did keep feeling like I need to throw up."  She continues to drink almost daily.  She reports 2 glasses of wine today.  Additionally, she states that she was diagnosed with neuropathy.  She reports worsening bilateral foot pain.  Pain is improved with walking around.  She is started on gabapentin today.  She rates her pain at 8 out of 10.  She denies any recent fevers, cough, shortness of breath, chest pain.  History reviewed. No pertinent past medical history.  Patient Active Problem List   Diagnosis Date Noted  . Closed fracture of distal end of left humerus 04/29/18 05/06/2018    Past Surgical History:  Procedure Laterality Date  . GASTRIC BYPASS    . TONSILLECTOMY       OB History   No obstetric history on file.      Home Medications    Prior to Admission medications   Medication Sig Start Date End Date Taking? Authorizing Provider  acetaminophen (TYLENOL) 500 MG tablet Take 1,000 mg by mouth every 6 (six) hours as needed for mild pain or moderate pain.    Yes [provider]  ferrous sulfate 325 (65 FE) MG tablet Take 325 mg by mouth daily with breakfast.   Yes [provider]  gabapentin (NEURONTIN) 600 MG tablet Take 600 mg by mouth daily.   Yes [provider]  Multiple Vitamin (MULTIVITAMIN WITH MINERALS) TABS tablet Take 1 tablet by mouth daily.   Yes [provider]  Guaifenesin  1200 MG TB12 Take 1 tablet (1,200 mg total) by mouth 2 (two) times daily. Patient not taking: Reported on 01/06/2019 11/28/18   Charlestine Night, PA-C  HYDROcodone-acetaminophen (NORCO/VICODIN) 5-325 MG tablet Take 2 tablets by mouth every 4 (four) hours as needed. Patient not taking: Reported on 11/10/2018 10/23/18   Vanetta Mulders, MD  methocarbamol (ROBAXIN) 500 MG tablet Take 1 tablet (500 mg total) by mouth 2 (two) times daily. Patient not taking: Reported on 01/06/2019 11/10/18   Hedges, Tinnie Gens, PA-C  ondansetron (ZOFRAN ODT) 4 MG disintegrating tablet Take 1 tablet (4 mg total) by mouth every 8 (eight) hours as needed for nausea or vomiting. 01/06/19   Assata Juncaj, Mayer Masker, MD  ondansetron (ZOFRAN) 4 MG tablet Take 1 tablet (4 mg total) by mouth every 6 (six) hours. Patient not taking: Reported on 01/06/2019 12/22/18   Eyvonne Mechanic, PA-C  oxyCODONE-acetaminophen (PERCOCET/ROXICET) 5-325 MG tablet Take 1 tablet by mouth every 6 (six) hours as needed for severe pain. Patient not taking: Reported on 01/06/2019 12/22/18   Eyvonne Mechanic, PA-C  promethazine-dextromethorphan (PROMETHAZINE-DM) 6.25-15 MG/5ML syrup Take 5 mLs by mouth 4 (four) times daily as needed for cough. Patient not taking: Reported on 01/06/2019 11/28/18   Charlestine Night, PA-C    Family History Family History  Problem Relation Age of Onset  . Chronic infections Mother   .  Healthy Father   . High blood pressure Maternal Grandfather   . Diabetes Maternal Grandfather   . Diabetes Paternal Grandmother   . Cancer Paternal Grandmother     Social History Social History   Tobacco Use  . Smoking status: Current Some Day Smoker    Packs/day: 0.50    Types: Cigarettes  . Smokeless tobacco: Never Used  . Tobacco comment: 4 cigarettes daily when she does smoke  Substance Use Topics  . Alcohol use: Yes    Comment: occ  . Drug use: No     Allergies   Ibuprofen   Review of Systems Review of Systems  Constitutional:  Negative for fever.  Respiratory: Negative for shortness of breath.   Cardiovascular: Negative for chest pain.  Gastrointestinal: Positive for constipation and nausea. Negative for abdominal pain, diarrhea and vomiting.  Genitourinary: Negative for dysuria.  Musculoskeletal:       Foot pain  Skin: Negative for rash and wound.  All other systems reviewed and are negative.    Physical Exam Updated Vital Signs BP (!) 133/92   Pulse (!) 116   Temp 97.9 F (36.6 C) (Oral)   Resp 11   Ht 1.575 m (5\' 2" )   Wt 86.6 kg   SpO2 100%   BMI 34.93 kg/m   Physical Exam Vitals signs and nursing note reviewed.  Constitutional:      Appearance: She is well-developed. She is obese.  HENT:     Head: Normocephalic and atraumatic.  Eyes:     Pupils: Pupils are equal, round, and reactive to light.  Neck:     Musculoskeletal: Neck supple.  Cardiovascular:     Rate and Rhythm: Normal rate and regular rhythm.     Heart sounds: Normal heart sounds.  Pulmonary:     Effort: Pulmonary effort is normal. No respiratory distress.     Breath sounds: No wheezing.  Abdominal:     General: Bowel sounds are normal.     Palpations: Abdomen is soft.  Musculoskeletal:        General: Tenderness present. No swelling.     Comments: Tenderness to palpation over the bilateral plantar fascia, no overlying skin changes  Skin:    General: Skin is warm and dry.  Neurological:     Mental Status: She is alert and oriented to person, place, and time.  Psychiatric:        Mood and Affect: Mood normal.      ED Treatments / Results  Labs (all labs ordered are listed, but only abnormal results are displayed) Labs Reviewed  LIPASE, BLOOD - Abnormal; Notable for the following components:      Result Value   Lipase 83 (*)    All other components within normal limits  COMPREHENSIVE METABOLIC PANEL - Abnormal; Notable for the following components:   CO2 21 (*)    AST 79 (*)    ALT 77 (*)    Total Bilirubin  1.4 (*)    All other components within normal limits  CBC - Abnormal; Notable for the following components:   RBC 3.44 (*)    Hemoglobin 11.4 (*)    HCT 34.5 (*)    MCV 100.3 (*)    RDW 17.2 (*)    Platelets 409 (*)    All other components within normal limits  ETHANOL - Abnormal; Notable for the following components:   Alcohol, Ethyl (B) 27 (*)    All other components within normal limits  URINALYSIS, ROUTINE  W REFLEX MICROSCOPIC  TSH  I-STAT BETA HCG BLOOD, ED ([facility], [facility], [facility] ONLY)    EKG EKG Interpretation  Date/Time:  Wednesday Jan 06 2019 01:03:55 EDT Ventricular Rate:  118 PR Interval:    QRS Duration: 71 QT Interval:  355 QTC Calculation: 498 R Axis:   42 Text Interpretation:  Sinus tachycardia Low voltage, precordial leads Probable anteroseptal infarct, old Confirmed by Ross Marcus ([id number]) on 01/06/2019 1:06:27 AM   Radiology Dg Abdomen Acute W/chest  Result Date: 01/06/2019 CLINICAL DATA:  38 year old female with generalized body pain and nausea for 2-3 days. EXAM: DG ABDOMEN ACUTE W/ 1V CHEST COMPARISON:  Chest radiographs 11/28/2018. FINDINGS: Lung volumes and mediastinal contours remain normal. Visualized tracheal air column is within normal limits. Both lungs are clear. No pneumothorax or pneumoperitoneum. Postoperative changes to bowel in the left abdomen. Staple line about the stomach with adjacent surgical clips. Non obstructed bowel gas pattern. Abdominal and pelvic visceral contours are within normal limits. No acute osseous abnormality identified. Incidental pelvic phleboliths. IMPRESSION: 1. 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 Normal bowel gas pattern, no free air. 2. No cardiopulmonary abnormality. Electronically Signed   By: Odessa Fleming M.D.   On: 01/06/2019 00:34    Procedures Procedures (including critical care time)  Medications Ordered in ED Medications  sodium chloride 0.9 % bolus 1,000 mL (0 mLs Intravenous Stopped 01/06/19  0245)  ondansetron (ZOFRAN) injection 4 mg (4 mg Intravenous Given 01/06/19 0057)  acetaminophen (TYLENOL) tablet 650 mg (650 mg Oral Given 01/06/19 0057)  sodium chloride 0.9 % bolus 1,000 mL (0 mLs Intravenous Stopped 01/06/19 0404)  LORazepam (ATIVAN) injection 1 mg (1 mg Intravenous Given 01/06/19 0255)     Initial Impression / Assessment and Plan / ED Course  I have reviewed the triage vital signs and the nursing notes.  Pertinent labs & imaging results that were available during my care of the patient were reviewed by me and considered in my medical decision making (see chart for details).  Clinical Course as of Jan 05 445  Wed Jan 06, 2019  6045 With persistent tachycardia.  She is afebrile.  She has received 1 L of fluids.  She does not appear to be in withdrawal but I will provide her with 1 mg of Ativan.  TSH was sent.  Additional 1 L fluids provided.  She is otherwise asymptomatic.   [CH]    Clinical Course User Index [CH] Andie Mortimer, Mayer Masker, MD       Patient presents with nausea and complaints of bilateral foot pain.  She is overall nontoxic-appearing.  Vital signs notable for tachycardia to 116.  She has recently been placed on gabapentin for neuropathy.  She has no wounds on her feet and has good perfusion.  SPECT ongoing neuropathy.  Her abdominal exam is benign.  Her work-up is largely reassuring.  She has mild elevation in her lipase and LFTs but this is improved from prior.  She continues to drink almost daily.  Blood alcohol level is 27.  She remained persistently tachycardic despite fluids.  She does not appear to be withdrawing she is not tremulous.  She was given an additional liter of fluids and Ativan.  She remained persistently tachycardic in the 110s.  He is otherwise asymptomatic.  I have reviewed her chart.  It appears on multiple recent presentation she has been tachycardic.  TSH is normal.  I do not feel this is related to an acute emergent process.  We  will have  her follow-up closely with her primary physician for recheck.  Regarding her nausea, this is improved and patient was able to tolerate fluids without difficulty.  She will be discharged home with Zofran.  She has a benign abdominal exam and doubt acute emergent process.  After history, exam, and medical workup I feel the patient has been appropriately medically screened and is safe for discharge home. Pertinent diagnoses were discussed with the patient. Patient was given return precautions.   Final Clinical Impressions(s) / ED Diagnoses   Final diagnoses:  Nausea  Neuropathy  Tachycardia    ED Discharge Orders         Ordered    ondansetron (ZOFRAN ODT) 4 MG disintegrating tablet  Every 8 hours PRN     01/06/19 0445           Yusuf Yu, Mayer Masker, MD 01/06/19 [phone]

## 2019-01-06 NOTE — ED Notes (Signed)
Gave pt a sprite and sandwich

## 2019-01-09 ENCOUNTER — Emergency Department (HOSPITAL_COMMUNITY): Payer: Medicaid Other

## 2019-01-09 ENCOUNTER — Encounter (HOSPITAL_COMMUNITY): Payer: Self-pay

## 2019-01-09 ENCOUNTER — Inpatient Hospital Stay (HOSPITAL_COMMUNITY)
Admission: EM | Admit: 2019-01-09 | Discharge: 2019-01-15 | DRG: 896 | Disposition: A | Discharge status: Home / Self Care | Payer: Medicaid Other | Attending: Family Medicine | Admitting: Family Medicine

## 2019-01-09 ENCOUNTER — Observation Stay (HOSPITAL_COMMUNITY): Payer: Medicaid Other

## 2019-01-09 ENCOUNTER — Other Ambulatory Visit: Payer: Self-pay

## 2019-01-09 DIAGNOSIS — E872 Acidosis, unspecified: Secondary | ICD-10-CM | POA: Diagnosis present

## 2019-01-09 DIAGNOSIS — F10939 Alcohol use, unspecified with withdrawal, unspecified: Secondary | ICD-10-CM | POA: Diagnosis present

## 2019-01-09 DIAGNOSIS — G8929 Other chronic pain: Secondary | ICD-10-CM | POA: Diagnosis present

## 2019-01-09 DIAGNOSIS — R7989 Other specified abnormal findings of blood chemistry: Secondary | ICD-10-CM | POA: Diagnosis present

## 2019-01-09 DIAGNOSIS — R945 Abnormal results of liver function studies: Secondary | ICD-10-CM | POA: Diagnosis not present

## 2019-01-09 DIAGNOSIS — I472 Ventricular tachycardia, unspecified: Secondary | ICD-10-CM

## 2019-01-09 DIAGNOSIS — F1010 Alcohol abuse, uncomplicated: Secondary | ICD-10-CM | POA: Diagnosis present

## 2019-01-09 DIAGNOSIS — F10239 Alcohol dependence with withdrawal, unspecified: Secondary | ICD-10-CM | POA: Diagnosis present

## 2019-01-09 DIAGNOSIS — F10930 Alcohol use, unspecified with withdrawal, uncomplicated: Secondary | ICD-10-CM

## 2019-01-09 DIAGNOSIS — Z79899 Other long term (current) drug therapy: Secondary | ICD-10-CM

## 2019-01-09 DIAGNOSIS — I10 Essential (primary) hypertension: Secondary | ICD-10-CM | POA: Diagnosis present

## 2019-01-09 DIAGNOSIS — K760 Fatty (change of) liver, not elsewhere classified: Secondary | ICD-10-CM | POA: Diagnosis present

## 2019-01-09 DIAGNOSIS — D539 Nutritional anemia, unspecified: Secondary | ICD-10-CM | POA: Diagnosis present

## 2019-01-09 DIAGNOSIS — G629 Polyneuropathy, unspecified: Secondary | ICD-10-CM

## 2019-01-09 DIAGNOSIS — D72825 Bandemia: Secondary | ICD-10-CM

## 2019-01-09 DIAGNOSIS — Z9884 Bariatric surgery status: Secondary | ICD-10-CM

## 2019-01-09 DIAGNOSIS — F17210 Nicotine dependence, cigarettes, uncomplicated: Secondary | ICD-10-CM | POA: Diagnosis present

## 2019-01-09 DIAGNOSIS — G9341 Metabolic encephalopathy: Secondary | ICD-10-CM | POA: Diagnosis present

## 2019-01-09 DIAGNOSIS — E669 Obesity, unspecified: Secondary | ICD-10-CM | POA: Diagnosis present

## 2019-01-09 DIAGNOSIS — I2699 Other pulmonary embolism without acute cor pulmonale: Secondary | ICD-10-CM | POA: Diagnosis present

## 2019-01-09 DIAGNOSIS — E559 Vitamin D deficiency, unspecified: Secondary | ICD-10-CM | POA: Diagnosis present

## 2019-01-09 DIAGNOSIS — I4729 Other ventricular tachycardia: Secondary | ICD-10-CM

## 2019-01-09 DIAGNOSIS — R000 Tachycardia, unspecified: Secondary | ICD-10-CM | POA: Diagnosis not present

## 2019-01-09 DIAGNOSIS — Z1159 Encounter for screening for other viral diseases: Secondary | ICD-10-CM

## 2019-01-09 DIAGNOSIS — F419 Anxiety disorder, unspecified: Secondary | ICD-10-CM | POA: Diagnosis present

## 2019-01-09 DIAGNOSIS — I2693 Single subsegmental pulmonary embolism without acute cor pulmonale: Secondary | ICD-10-CM | POA: Diagnosis present

## 2019-01-09 DIAGNOSIS — F10230 Alcohol dependence with withdrawal, uncomplicated: Secondary | ICD-10-CM | POA: Diagnosis not present

## 2019-01-09 DIAGNOSIS — E538 Deficiency of other specified B group vitamins: Secondary | ICD-10-CM | POA: Diagnosis present

## 2019-01-09 DIAGNOSIS — D72829 Elevated white blood cell count, unspecified: Secondary | ICD-10-CM | POA: Diagnosis present

## 2019-01-09 HISTORY — DX: Polyneuropathy, unspecified: G62.9

## 2019-01-09 LAB — RAPID URINE DRUG SCREEN, HOSP PERFORMED
Amphetamines: NOT DETECTED
Barbiturates: NOT DETECTED
Benzodiazepines: NOT DETECTED
Cocaine: NOT DETECTED
Opiates: POSITIVE — AB
Tetrahydrocannabinol: NOT DETECTED

## 2019-01-09 LAB — URINALYSIS, ROUTINE W REFLEX MICROSCOPIC
Bilirubin Urine: NEGATIVE
Glucose, UA: NEGATIVE mg/dL
Hgb urine dipstick: NEGATIVE
Ketones, ur: NEGATIVE mg/dL
Leukocytes,Ua: NEGATIVE
Nitrite: NEGATIVE
Protein, ur: NEGATIVE mg/dL
Specific Gravity, Urine: 1.019 (ref 1.005–1.030)
pH: 5 (ref 5.0–8.0)

## 2019-01-09 LAB — LACTIC ACID, PLASMA: Lactic Acid, Venous: 3.1 mmol/L (ref 0.5–1.9)

## 2019-01-09 LAB — BLOOD GAS, VENOUS
Acid-base deficit: 0.6 mmol/L (ref 0.0–2.0)
Bicarbonate: 24.2 mmol/L (ref 20.0–28.0)
FIO2: 21
O2 Saturation: 65.8 %
Patient temperature: 98.6
pCO2, Ven: 43.4 mmHg — ABNORMAL LOW (ref 44.0–60.0)
pH, Ven: 7.365 (ref 7.250–7.430)
pO2, Ven: 39.6 mmHg (ref 32.0–45.0)

## 2019-01-09 LAB — COMPREHENSIVE METABOLIC PANEL
ALT: 85 U/L — ABNORMAL HIGH (ref 0–44)
AST: 99 U/L — ABNORMAL HIGH (ref 15–41)
Albumin: 4.1 g/dL (ref 3.5–5.0)
Alkaline Phosphatase: 126 U/L (ref 38–126)
Anion gap: 14 (ref 5–15)
BUN: 5 mg/dL — ABNORMAL LOW (ref 6–20)
CO2: 21 mmol/L — ABNORMAL LOW (ref 22–32)
Calcium: 9.1 mg/dL (ref 8.9–10.3)
Chloride: 107 mmol/L (ref 98–111)
Creatinine, Ser: 0.64 mg/dL (ref 0.44–1.00)
GFR calc Af Amer: >60 mL/min (ref >60)
GFR calc non Af Amer: >60 mL/min (ref >60)
Glucose, Bld: 72 mg/dL (ref 70–99)
Potassium: 4 mmol/L (ref 3.5–5.1)
Sodium: 142 mmol/L (ref 135–145)
Total Bilirubin: 1.2 mg/dL (ref 0.3–1.2)
Total Protein: 8 g/dL (ref 6.5–8.1)

## 2019-01-09 LAB — PHOSPHORUS: Phosphorus: 4.1 mg/dL (ref 2.5–4.6)

## 2019-01-09 LAB — D-DIMER, QUANTITATIVE: D-Dimer, Quant: 1.57 ug/mL-FEU — ABNORMAL HIGH (ref 0.00–0.50)

## 2019-01-09 LAB — CBC WITH DIFFERENTIAL/PLATELET
Abs Immature Granulocytes: 0.04 10*3/uL (ref 0.00–0.07)
Basophils Absolute: 0 10*3/uL (ref 0.0–0.1)
Basophils Relative: 0 %
Eosinophils Absolute: 0.1 10*3/uL (ref 0.0–0.5)
Eosinophils Relative: 1 %
HCT: 36.2 % (ref 36.0–46.0)
Hemoglobin: 11.5 g/dL — ABNORMAL LOW (ref 12.0–15.0)
Immature Granulocytes: 0 %
Lymphocytes Relative: 12 %
Lymphs Abs: 1.5 10*3/uL (ref 0.7–4.0)
MCH: 32.7 pg (ref 26.0–34.0)
MCHC: 31.8 g/dL (ref 30.0–36.0)
MCV: 102.8 fL — ABNORMAL HIGH (ref 80.0–100.0)
Monocytes Absolute: 0.5 10*3/uL (ref 0.1–1.0)
Monocytes Relative: 4 %
Neutro Abs: 10.1 10*3/uL — ABNORMAL HIGH (ref 1.7–7.7)
Neutrophils Relative %: 83 %
Platelets: 373 10*3/uL (ref 150–400)
RBC: 3.52 MIL/uL — ABNORMAL LOW (ref 3.87–5.11)
RDW: 17.9 % — ABNORMAL HIGH (ref 11.5–15.5)
WBC: 12.2 10*3/uL — ABNORMAL HIGH (ref 4.0–10.5)
nRBC: 0.2 % (ref 0.0–0.2)

## 2019-01-09 LAB — CK: Total CK: 51 U/L (ref 38–234)

## 2019-01-09 LAB — SARS CORONAVIRUS 2 BY RT PCR (HOSPITAL ORDER, PERFORMED IN ~~LOC~~ HOSPITAL LAB): SARS Coronavirus 2: NEGATIVE

## 2019-01-09 LAB — FOLATE: Folate: 2.1 ng/mL — ABNORMAL LOW

## 2019-01-09 LAB — ETHANOL: Alcohol, Ethyl (B): <10 mg/dL (ref <10)

## 2019-01-09 LAB — PREGNANCY, URINE: Preg Test, Ur: NEGATIVE

## 2019-01-09 LAB — PROTIME-INR
INR: 1 (ref 0.8–1.2)
Prothrombin Time: 13.2 s (ref 11.4–15.2)

## 2019-01-09 LAB — VITAMIN B12: Vitamin B-12: 248 pg/mL (ref 180–914)

## 2019-01-09 LAB — CBG MONITORING, ED: Glucose-Capillary: 73 mg/dL (ref 70–99)

## 2019-01-09 LAB — MAGNESIUM: Magnesium: 2 mg/dL (ref 1.7–2.4)

## 2019-01-09 LAB — LIPASE, BLOOD: Lipase: 53 U/L — ABNORMAL HIGH (ref 11–51)

## 2019-01-09 LAB — TROPONIN I: Troponin I: <0.03 ng/mL (ref <0.03)

## 2019-01-09 MED ORDER — METOPROLOL TARTRATE 5 MG/5ML IV SOLN
5.0000 mg | Freq: Once | INTRAVENOUS | Status: AC
  Administered 2019-01-10: 5 mg via INTRAVENOUS
  Filled 2019-01-09: qty 5

## 2019-01-09 MED ORDER — LACTATED RINGERS IV BOLUS
1000.0000 mL | Freq: Once | INTRAVENOUS | Status: AC
  Administered 2019-01-09: 16:00:00 1000 mL via INTRAVENOUS

## 2019-01-09 MED ORDER — HYDROMORPHONE HCL 1 MG/ML IJ SOLN
1.0000 mg | Freq: Once | INTRAMUSCULAR | Status: AC
  Administered 2019-01-09: 17:00:00 1 mg via INTRAVENOUS
  Filled 2019-01-09: qty 1

## 2019-01-09 MED ORDER — SODIUM CHLORIDE (PF) 0.9 % IJ SOLN
INTRAMUSCULAR | Status: AC
  Filled 2019-01-09: qty 50

## 2019-01-09 MED ORDER — LACTATED RINGERS IV BOLUS
1000.0000 mL | Freq: Once | INTRAVENOUS | Status: AC
  Administered 2019-01-09: 17:00:00 1000 mL via INTRAVENOUS

## 2019-01-09 MED ORDER — SODIUM CHLORIDE 0.9 % IV BOLUS
500.0000 mL | Freq: Once | INTRAVENOUS | Status: AC
  Administered 2019-01-09: 22:00:00 500 mL via INTRAVENOUS

## 2019-01-09 MED ORDER — NALOXONE HCL 0.4 MG/ML IJ SOLN
0.4000 mg | INTRAMUSCULAR | Status: DC | PRN
  Filled 2019-01-09: qty 1

## 2019-01-09 MED ORDER — GABAPENTIN 300 MG PO CAPS
600.0000 mg | ORAL_CAPSULE | Freq: Once | ORAL | Status: AC
  Administered 2019-01-09: 600 mg via ORAL
  Filled 2019-01-09 (×2): qty 2

## 2019-01-09 MED ORDER — HYDROMORPHONE HCL 1 MG/ML IJ SOLN
1.0000 mg | Freq: Once | INTRAMUSCULAR | Status: AC
  Administered 2019-01-09: 16:00:00 1 mg via INTRAVENOUS
  Filled 2019-01-09: qty 1

## 2019-01-09 MED ORDER — IOHEXOL 350 MG/ML SOLN
100.0000 mL | Freq: Once | INTRAVENOUS | Status: AC | PRN
  Administered 2019-01-09: 22:00:00 100 mL via INTRAVENOUS

## 2019-01-09 MED ORDER — HEPARIN (PORCINE) 25000 UT/250ML-% IV SOLN
1300.0000 [IU]/h | INTRAVENOUS | Status: DC
  Administered 2019-01-10: 1300 [IU]/h via INTRAVENOUS
  Filled 2019-01-09: qty 250

## 2019-01-09 MED ORDER — FOLIC ACID 5 MG/ML IJ SOLN
1.0000 mg | Freq: Every day | INTRAMUSCULAR | Status: DC
  Administered 2019-01-10: 09:00:00 1 mg via INTRAVENOUS
  Filled 2019-01-09 (×2): qty 0.2

## 2019-01-09 MED ORDER — LORAZEPAM 2 MG/ML IJ SOLN
1.0000 mg | Freq: Once | INTRAMUSCULAR | Status: AC
  Administered 2019-01-09: 21:00:00 1 mg via INTRAVENOUS
  Filled 2019-01-09: qty 1

## 2019-01-09 MED ORDER — HEPARIN BOLUS VIA INFUSION
4000.0000 [IU] | Freq: Once | INTRAVENOUS | Status: AC
  Administered 2019-01-10: 4000 [IU] via INTRAVENOUS
  Filled 2019-01-09: qty 4000

## 2019-01-09 NOTE — ED Provider Notes (Signed)
[facility]-EMERGENCY DEPT Provider Note   CSN: [id number] Arrival date & time: 01/09/19  1441    History   Chief Complaint Chief Complaint  Patient presents with  . Bilateral Foot Pain    HPI Stacy [name] is a 38 y.o. female.     HPI  38 year old female presents with severe bilateral foot pain and swelling.  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  She then put on socks.  Within the next hour or 2 while she had the IcyHot on she developed significantly worsening pain.  She did eventually washed it off with soap and water.  She is now having severe sharp/stabbing pain that starts in her feet and radiates up her legs.  She feels like her feet are swollen.  She has not tried anything new for the pain and came via EMS.  She denies being ill otherwise including no fevers, vomiting, abdominal pain, diarrhea.  She denies any illicit drug use or alcohol use today.  When asked what she is on for neuropathy she cannot tell me but when mentioning gabapentin she states she used to be on this but ran out a few days ago, she thinks this was 3 or 4 days ago.  Past Medical History:  Diagnosis Date  . Neuropathy 01/09/2019    Patient Active Problem List   Diagnosis Date Noted  . Closed fracture of distal end of left humerus 04/29/18 05/06/2018    Past Surgical History:  Procedure Laterality Date  . GASTRIC BYPASS    . TONSILLECTOMY       OB History   No obstetric history on file.      Home Medications    Prior to Admission medications   Medication Sig Start Date End Date Taking? Authorizing Provider  acetaminophen (TYLENOL) 500 MG tablet Take 1,000 mg by mouth every 6 (six) hours as needed for mild pain or moderate pain.     [provider]  ferrous sulfate 325 (65 FE) MG tablet Take 325 mg by mouth daily with breakfast.    [provider]  gabapentin (NEURONTIN) 600 MG  tablet Take 600 mg by mouth daily.    [provider]  Guaifenesin 1200 MG TB12 Take 1 tablet (1,200 mg total) by mouth 2 (two) times daily. Patient not taking: Reported on 01/06/2019 11/28/18   Charlestine Night, PA-C  HYDROcodone-acetaminophen (NORCO/VICODIN) 5-325 MG tablet Take 2 tablets by mouth every 4 (four) hours as needed. Patient not taking: Reported on 11/10/2018 10/23/18   Vanetta Mulders, MD  methocarbamol (ROBAXIN) 500 MG tablet Take 1 tablet (500 mg total) by mouth 2 (two) times daily. Patient not taking: Reported on 01/06/2019 11/10/18   Eyvonne Mechanic, PA-C  Multiple Vitamin (MULTIVITAMIN WITH MINERALS) TABS tablet Take 1 tablet by mouth daily.    [provider]  ondansetron (ZOFRAN ODT) 4 MG disintegrating tablet Take 1 tablet (4 mg total) by mouth every 8 (eight) hours as needed for nausea or vomiting. 01/06/19   Horton, Mayer Masker, MD  ondansetron (ZOFRAN) 4 MG tablet Take 1 tablet (4 mg total) by mouth every 6 (six) hours. Patient not taking: Reported on 01/06/2019 12/22/18   Eyvonne Mechanic, PA-C  oxyCODONE-acetaminophen (PERCOCET/ROXICET) 5-325 MG tablet Take 1 tablet by mouth every 6 (six) hours as needed for severe pain. Patient not taking: Reported on 01/06/2019 12/22/18   Eyvonne Mechanic, PA-C  promethazine-dextromethorphan (PROMETHAZINE-DM) 6.25-15 MG/5ML syrup Take 5  mLs by mouth 4 (four) times daily as needed for cough. Patient not taking: Reported on 01/06/2019 11/28/18   Charlestine Night, PA-C    Family History Family History  Problem Relation Age of Onset  . Chronic infections Mother   . Healthy Father   . High blood pressure Maternal Grandfather   . Diabetes Maternal Grandfather   . Diabetes Paternal Grandmother   . Cancer Paternal Grandmother     Social History Social History   Tobacco Use  . Smoking status: Current Some Day Smoker    Packs/day: 0.50    Types: Cigarettes  . Smokeless tobacco: Never Used  . Tobacco comment: 4 cigarettes  daily when she does smoke  Substance Use Topics  . Alcohol use: Yes    Comment: occ  . Drug use: No     Allergies   Ibuprofen   Review of Systems Review of Systems  Constitutional: Negative for fever.  Gastrointestinal: Negative for abdominal pain, diarrhea and vomiting.  Musculoskeletal: Positive for joint swelling and myalgias.  Neurological: Positive for numbness (chronic). Negative for weakness.  All other systems reviewed and are negative.    Physical Exam Updated Vital Signs BP (!) 152/11 (BP Location: Left Arm)   Pulse (!) 137   Temp 98.1 F (36.7 C) (Oral)   Resp (!) 25   Ht  (1.575 m)   Wt 87 kg   SpO2 100%   BMI 35.08 kg/m   Physical Exam Vitals signs and nursing note reviewed.  Constitutional:      Appearance: She is well-developed. She is not ill-appearing or diaphoretic.     Comments: In severe pain, is writhing back and forth and hard to sit still  HENT:     Head: Normocephalic and atraumatic.     Right Ear: External ear normal.     Left Ear: External ear normal.     Nose: Nose normal.  Eyes:     General:        Right eye: No discharge.        Left eye: No discharge.  Cardiovascular:     Rate and Rhythm: Regular rhythm. Tachycardia present.     Pulses:          Dorsalis pedis pulses are 2+ on the right side and 2+ on the left side.     Heart sounds: Normal heart sounds.  Pulmonary:     Effort: Pulmonary effort is normal.     Breath sounds: Normal breath sounds.  Abdominal:     General: There is no distension.  Musculoskeletal:     Right lower leg: She exhibits no swelling.     Left lower leg: She exhibits no swelling.     Right foot: Tenderness and swelling present.     Left foot: Tenderness and swelling present.     Comments: Feet are mildly swollen bilaterally. Normal color and warmth. Very tender to minimal touch. Normal ROM of foot/ankle. No tenderness or swelling to calves  Skin:    General: Skin is warm and dry.   Neurological:     Mental Status: She is alert.  Psychiatric:        Mood and Affect: Mood is not anxious.      ED Treatments / Results  Labs (all labs ordered are listed, but only abnormal results are displayed) Labs Reviewed  CBC WITH DIFFERENTIAL/PLATELET  URINALYSIS, ROUTINE W REFLEX MICROSCOPIC  ETHANOL  RAPID URINE DRUG SCREEN, HOSP PERFORMED  COMPREHENSIVE METABOLIC PANEL  CBG  MONITORING, ED    EKG EKG Interpretation  Date/Time:  Saturday Jan 09 2019 15:02:48 EDT Ventricular Rate:  139 PR Interval:    QRS Duration: 66 QT Interval:  298 QTC Calculation: 454 R Axis:   66 Text Interpretation:  Sinus tachycardia Multiform ventricular premature complexes Aberrant complex Probable anteroseptal infarct, old similar to Jan 06 2019 Confirmed by Pricilla Loveless [id number]) on 01/09/2019 3:19:53 PM     Radiology Dg Chest 2 View  Result Date: 01/09/2019 CLINICAL DATA:  Tachycardia EXAM: CHEST - 2 VIEW COMPARISON:  01/06/2011 FINDINGS: The heart size and mediastinal contours are within normal limits. Both lungs are clear. The visualized skeletal structures are unremarkable. IMPRESSION: No active cardiopulmonary disease. Electronically Signed   By: Alcide Clever M.D.   On: 01/09/2019 19:10    Procedures .Critical Care Performed by: Pricilla Loveless, MD Authorized by: Pricilla Loveless, MD   Critical care provider statement:    Critical care time (minutes):  30   Critical care time was exclusive of:  Separately billable procedures and treating other patients   Critical care was necessary to treat or prevent imminent or life-threatening deterioration of the following conditions:  Circulatory failure and cardiac failure   Critical care was time spent personally by me on the following activities:  Discussions with consultants, evaluation of patient's response to treatment, examination of patient, ordering and performing treatments and interventions, ordering and review of laboratory  studies, ordering and review of radiographic studies, pulse oximetry, re-evaluation of patient's condition, obtaining history from patient or surrogate and review of old charts   (including critical care time)  Medications Ordered in ED Medications  lactated ringers bolus 1,000 mL (has no administration in time range)  HYDROmorphone (DILAUDID) injection 1 mg (has no administration in time range)  gabapentin (NEURONTIN) tablet 600 mg (has no administration in time range)     Initial Impression / Assessment and Plan / ED Course  I have reviewed the triage vital signs and the nursing notes.  Pertinent labs & imaging results that were available during my care of the patient were reviewed by me and considered in my medical decision making (see chart for details).        Patient appears neurovascular intact from her foot pain.  The pain is likely related to the IcyHot she applied.  There is some swelling though not sure this is acute from her icy hot today.  However she is noted to be quite tachycardic, some of which is pain related and some of which appears to be a chronic issue.  However she was noted to go into ventricular tachycardia that resolved on its own.  It lasted about 30 beats.  I discussed with Dr. Jens Som, who recommended she could have metoprolol assuming no drug contraindications such as cocaine.  At this point, still working up her tachycardia so we will hold off on the metoprolol.  She has no chest symptoms such as chest pain or shortness of breath.  The VT has not recurred.  Will expand work-up after discussion with Dr. Adela Glimpse, including folate, B12 and d-dimer.  Otherwise her initial blood work and urine are unremarkable.  Chest x-ray is clear and no cardiomegaly seen on chest x-ray. Her pain has been controlled with 2 doses of IV Dilaudid so I do not think the persistent tachycardia is pain related.  She was also given 2 L IV fluid.  Admit for observation and work-up.  Final  Clinical Impressions(s) / ED Diagnoses   Final  diagnoses:  Tachycardia  Ventricular tachycardia [facility])    ED Discharge Orders    None       Pricilla LovelessGoldston, Nayelis Bonito, MD 01/09/19 2109

## 2019-01-09 NOTE — ED Notes (Signed)
Bed: TR32 Expected date:  Expected time:  Means of arrival:  Comments: Allergic reaction to [facility]

## 2019-01-09 NOTE — ED Notes (Signed)
Patient's father, Peyton Najjar, would like updates at [phone].

## 2019-01-09 NOTE — ED Notes (Signed)
ED Provider at bedside. 

## 2019-01-09 NOTE — ED Notes (Signed)
Report given to Vera, RN

## 2019-01-09 NOTE — H&P (Addendum)
Stacy [name] ASN:[id number] DOB: 04/26/1981 DOA: 01/09/2019     PCP: Kathie Rhodes, MD   Outpatient Specialists:   NONE    Patient arrived to ER on 01/09/19 at 1441  Patient coming from: home Lives  With family   Chief Complaint:  Chief Complaint  Patient presents with  . Bilateral Foot Pain    HPI: Stacy [name] is a 38 y.o. female with medical history significant of gastric bypass,      Presented with  Bilateral feet pain Per review of the chart Patient have had body aches, feet pain and numbness of bilateral lower extremity. and intermittent chest pain since March requesting frequent evaluations.  She reported at some point tingling and numbness from her thighs down to her feet not associated with any weakness.  No trauma associated this. She does have chronic back pain but that does not seem to be changing. At some point she was told she has neuropathy but has not followed up for this.  She is status post bypass and have not been taking any of her multivitamins for months. In the beginning of April she was treated emergency department for presumed coronavirus she was not tested at that time. In May she presented to emergency department with bilateral lower extremity tingling at that time she was already tachycardic to 115 Hemoglobin noted to be 11.2 Elevated AST 404 ALT 207 alk phos 155 and bilirubin 2.7 Folate was checked at that time and was low at 2.7 Was prescribed folic acid and diagnosed with folate deficiency. She was supposed to follow-up with her primary care provider but did not on 19 May she was evaluated emergency department again Again reporting nausea and severe foot pain she has been drinking on a regular basis about 2 glasses of wine a day. He was prescribed a Trial of Neurontin Again on 19 May heart rate registering at 116 At that Time lipase was 83 of note had elevated lipase up tp 203 in the beginning of May MCV 100.3 On May 5th Vitamin D  was down 5.2 On 20th of May TSH was wnl  Today patient states she tried to apply icy hot to her feet and it started burning severely so she called 911 Feet are swollen. States she ran out of gabapentin.  Per records that was prescribed only few days ago.   Reports no chest pain no cough No sick contacts  After prolonged discussion patient admitted to drinking up to 4 glasses of wine a day and is been going on for a few years she is having symptoms of withdrawal when she does not drink such as sensation of anxiety but no history of seizures. Patient is interested in quitting and since she is planning to go to rehab this upcoming week.  Infectious risk factors:  Reports occasional nausea In RAPID COVID TEST NEGATIVE     Regarding pertinent Chronic problems:    Deficiency she is supposed to take folic acid for this  Gastric ByPass done in 2014 She stopped follow up   While in ER: Now LFTs seem to be down AST ALT down to 80/90's All monitored in ER noted to be again persistently tachycardic as high as 140s at some point had a 30 beats of V. tach cardiology was consulted and felt be prudent to check electrolytes which were within normal limits they felt that she is okay to go home will be followed up in as an outpatient But patient had severe  persistent tachycardia and leg pain requiring Dilaudid IV for pain control  While being evaluated pt started to have a cough  The following Work up has been ordered so far:  Orders Placed This Encounter  Procedures  . Critical Care  . SARS Coronavirus 2 (CEPHEID - Performed in [place] hospital lab), [facility]  . DG Chest 2 View  . CT ANGIO CHEST PE W OR WO CONTRAST  . CBC with Differential  . Urinalysis, Routine w reflex microscopic  . Ethanol  . Urine rapid drug screen (hosp performed)  . Comprehensive metabolic panel  . Magnesium  . Pregnancy, urine  . D-dimer, quantitative  . Folate  . Vitamin B12  . Phosphorus  . CK  .  Lactic acid, plasma  . Ammonia  . Protime-INR  . Blood gas, venous  . Troponin I - Now Then Q6H  . Hepatitis panel, acute  . Lipase, blood  . Homocysteine  . Methylmalonic acid, serum  . VITAMIN D 25 Hydroxy (Vit-D Deficiency, Fractures)  . Cardiac monitoring  . Cardiac monitoring  . Inpatient consult to Cardiology  . Consult to hospitalist  . Consult to social work  . CBG monitoring, ED  . ED EKG  . EKG 12-Lead  . EKG 12-Lead  . ECHOCARDIOGRAM COMPLETE  . Saline lock IV  . Place in observation (patient's expected length of stay will be less than 2 midnights)  . Place in observation (patient's expected length of stay will be less than 2 midnights)     Following Medications were ordered in ER: Medications  sodium chloride 0.9 % bolus 500 mL (has no administration in time range)  lactated ringers bolus 1,000 mL (0 mLs Intravenous Stopped 01/09/19 1718)  HYDROmorphone (DILAUDID) injection 1 mg (1 mg Intravenous Given 01/09/19 1541)  gabapentin (NEURONTIN) capsule 600 mg (600 mg Oral Given 01/09/19 1539)  lactated ringers bolus 1,000 mL (0 mLs Intravenous Stopped 01/09/19 1953)  HYDROmorphone (DILAUDID) injection 1 mg (1 mg Intravenous Given 01/09/19 1718)  LORazepam (ATIVAN) injection 1 mg (1 mg Intravenous Given 01/09/19 2054)        Consult Orders  (From admission, onward)         Start     Ordered   01/09/19 2052  Consult to social work  Once    Provider:  (Not yet assigned)  Question:  Reason for Consult:  Answer:  Current substance abuse   01/09/19 2051   01/09/19 1815  Consult to hospitalist  Once    Provider:  Toy Baker, MD  Question Answer Comment  Place call to: [facility]   Reason for Consult Admit      01/09/19 1814          ER Provider Called: Cardiology    Dr.Crenshaw They Recommend need an Echo   Significant initial  Findings: Abnormal Labs Reviewed  CBC WITH DIFFERENTIAL/PLATELET - Abnormal; Notable for the following components:       Result Value   WBC 12.2 (*)    RBC 3.52 (*)    Hemoglobin 11.5 (*)    MCV 102.8 (*)    RDW 17.9 (*)    Neutro Abs 10.1 (*)    All other components within normal limits  URINALYSIS, ROUTINE W REFLEX MICROSCOPIC - Abnormal; Notable for the following components:   APPearance HAZY (*)    All other components within normal limits  RAPID URINE DRUG SCREEN, HOSP PERFORMED - Abnormal; Notable for the following components:   Opiates POSITIVE (*)  All other components within normal limits  COMPREHENSIVE METABOLIC PANEL - Abnormal; Notable for the following components:   CO2 21 (*)    BUN 5 (*)    AST 99 (*)    ALT 85 (*)    All other components within normal limits  D-DIMER, QUANTITATIVE (NOT AT [facility]) - Abnormal; Notable for the following components:   D-Dimer, Quant 1.57 (*)    All other components within normal limits  FOLATE - Abnormal; Notable for the following components:   Folate 2.1 (*)    All other components within normal limits  LACTIC ACID, PLASMA - Abnormal; Notable for the following components:   Lactic Acid, Venous 3.1 (*)    All other components within normal limits  BLOOD GAS, VENOUS - Abnormal; Notable for the following components:   pCO2, Ven 43.4 (*)    All other components within normal limits  LIPASE, BLOOD - Abnormal; Notable for the following components:   Lipase 53 (*)    All other components within normal limits    Otherwise labs showing:    Recent Labs  Lab 01/05/19 2001 01/09/19 1514 01/09/19 1602 01/09/19 2000  NA 136 142  --   --   K 3.7 4.0  --   --   CO2 21* 21*  --   --   GLUCOSE 93 72  --   --   BUN 6 5*  --   --   CREATININE 0.73 0.64  --   --   CALCIUM 9.5 9.1  --   --   MG  --   --  2.0  --   PHOS  --   --   --  4.1    Cr   stable,   Lab Results  Component Value Date   CREATININE 0.64 01/09/2019   CREATININE 0.73 01/05/2019   CREATININE 0.85 12/22/2018    Recent Labs  Lab 01/05/19 2001 01/09/19 1514  AST 79* 99*   ALT 77* 85*  ALKPHOS 115 126  BILITOT 1.4* 1.2  PROT 7.3 8.0  ALBUMIN 3.5 4.1   Lab Results  Component Value Date   CALCIUM 9.1 01/09/2019   PHOS 4.1 01/09/2019     WBC      Component Value Date/Time   WBC 12.2 (H) 01/09/2019 1512   ANC    Component Value Date/Time   NEUTROABS 10.1 (H) 01/09/2019 1512   ALC No results found for: LYMPHOABS    Plt: Lab Results  Component Value Date   PLT 373 01/09/2019    Lactic Acid, Venous    Component Value Date/Time   LATICACIDVEN 3.1 ([place]) 01/09/2019 2000       No results found for: CRP Lab Results  Component Value Date   DDIMER 1.57 (H) 01/09/2019     Venous  Blood Gas result:  pH 7.365 pCO2 43.4;     HG/HCT  Stable,     Component Value Date/Time   HGB 11.5 (L) 01/09/2019 1512   HCT 36.2 01/09/2019 1512    Recent Labs  Lab 01/05/19 2001 01/09/19 2000  LIPASE 83* 53*   No results for input(s): AMMONIA in the last 168 hours.  No components found for: LABALBU   Troponin   Cardiac Panel (last 3 results) Recent Labs    01/09/19 2000  CKTOTAL 51  TROPONINI <0.03       CBG (last 3)  Recent Labs    01/09/19 1521  GLUCAP 73      UA  no evidence of UTI    Urine analysis:    Component Value Date/Time   COLORURINE YELLOW 01/09/2019 1735   APPEARANCEUR HAZY (A) 01/09/2019 1735   LABSPEC 1.019 01/09/2019 1735   PHURINE 5.0 01/09/2019 1735   GLUCOSEU NEGATIVE 01/09/2019 1735   [place] 01/09/2019 1735   [place] 01/09/2019 1735   KETONESUR NEGATIVE 01/09/2019 1735   PROTEINUR NEGATIVE 01/09/2019 1735   UROBILINOGEN 0.2 09/20/2008 1437   NITRITE NEGATIVE 01/09/2019 [place] 01/09/2019 1735       CXR -  NON acute   CTA chest - ordered US 12/22/2018-showing hepatic steatosis  ECG:  Personally reviewed by me showing: HR : 139 Rhythm:  Sinus tachycardia     no evidence of ischemic changes QTC 454      ED Triage Vitals  Enc Vitals Group     BP 01/09/19  1458 (!) 152/11     Pulse Rate 01/09/19 1458 (!) 137     Resp 01/09/19 1458 (!) 25     Temp 01/09/19 1458 98.1 F (36.7 C)     Temp Source 01/09/19 1458 Oral     SpO2 01/09/19 1447 100 %     Weight 01/09/19 1459 191 lb 12.8 oz (87 kg)     Height 01/09/19 1459 5' 2"  (1.575 m)     Head Circumference --      Peak Flow --      Pain Score 01/09/19 1459 10     Pain Loc --      Pain Edu? --      Excl. in [place]? --   TMAX(24)@       Latest  Blood pressure (!) 143/105, pulse (!) 111, temperature 98.1 F (36.7 C), temperature source Oral, resp. rate 18, height 5' 2"  (1.575 m), weight 87 kg, SpO2 98 %.     Hospitalist was called for admission for alcohol withdrawal and tachycardia   Review of Systems:    Pertinent positives include:  Feet pain, nausea  non-productive cough,  Constitutional:  No weight loss, night sweats, Fevers, chills, fatigue, weight loss  HEENT:  No headaches, Difficulty swallowing,Tooth/dental problems,Sore throat,  No sneezing, itching, ear ache, nasal congestion, post nasal drip,  Cardio-vascular:  No chest pain, Orthopnea, PND, anasarca, dizziness, palpitations.no Bilateral lower extremity swelling  GI:  No heartburn, indigestion,  vomiting, diarrhea, change in bowel habits, loss of appetite, melena, blood in stool, hematemesis Resp:  no shortness of breath at rest. No dyspnea on exertion, No excess mucus, no productive cough, No No coughing up of blood.No change in color of mucus.No wheezing. Skin:  no rash or lesions. No jaundice GU:  no dysuria, change in color of urine, no urgency or frequency. No straining to urinate.  No flank pain.  Musculoskeletal:  No joint pain or no joint swelling. No decreased range of motion. No back pain.  Psych:  No change in mood or affect. No depression or anxiety. No memory loss.  Neuro: no localizing neurological complaints, no tingling, no weakness, no double vision, no gait abnormality, no slurred speech, no confusion   All systems reviewed and apart from [place] all are negative  Past Medical History:   Past Medical History:  Diagnosis Date  . Neuropathy 01/09/2019      Past Surgical History:  Procedure Laterality Date  . GASTRIC BYPASS    . TONSILLECTOMY      Social History:  Ambulatory   independently      reports  that she has been smoking cigarettes. She has been smoking about 0.50 packs per day. She has never used smokeless tobacco. She reports current alcohol use. She reports that she does not use drugs.     Family History:   Family History  Problem Relation Age of Onset  . Chronic infections Mother   . Healthy Father   . High blood pressure Maternal Grandfather   . Diabetes Maternal Grandfather   . Diabetes Paternal Grandmother   . Cancer Paternal Grandmother     Allergies: Allergies  Allergen Reactions  . Ibuprofen     Gastric bypass surgery and history of gi bleed.     Prior to Admission medications   Medication Sig Start Date End Date Taking? Authorizing Provider  acetaminophen (TYLENOL) 500 MG tablet Take 1,000 mg by mouth every 6 (six) hours as needed for mild pain or moderate pain.    Yes [provider]  ferrous sulfate 325 (65 FE) MG tablet Take 325 mg by mouth daily with breakfast.   Yes [provider]  gabapentin (NEURONTIN) 600 MG tablet Take 600 mg by mouth daily.   Yes [provider]  Multiple Vitamin (MULTIVITAMIN WITH MINERALS) TABS tablet Take 1 tablet by mouth daily.   Yes [provider]  Guaifenesin 1200 MG TB12 Take 1 tablet (1,200 mg total) by mouth 2 (two) times daily. Patient not taking: Reported on 01/06/2019 11/28/18   Dalia Heading, PA-C  HYDROcodone-acetaminophen (NORCO/VICODIN) 5-325 MG tablet Take 2 tablets by mouth every 4 (four) hours as needed. Patient not taking: Reported on 11/10/2018 10/23/18   Fredia Sorrow, MD  methocarbamol (ROBAXIN) 500 MG tablet Take 1 tablet (500 mg total) by mouth 2 (two)  times daily. Patient not taking: Reported on 01/06/2019 11/10/18   Hedges, Dellis Filbert, PA-C  ondansetron (ZOFRAN ODT) 4 MG disintegrating tablet Take 1 tablet (4 mg total) by mouth every 8 (eight) hours as needed for nausea or vomiting. Patient not taking: Reported on 01/09/2019 01/06/19   Horton, Barbette Hair, MD  ondansetron (ZOFRAN) 4 MG tablet Take 1 tablet (4 mg total) by mouth every 6 (six) hours. Patient not taking: Reported on 01/06/2019 12/22/18   Okey Regal, PA-C  oxyCODONE-acetaminophen (PERCOCET/ROXICET) 5-325 MG tablet Take 1 tablet by mouth every 6 (six) hours as needed for severe pain. Patient not taking: Reported on 01/06/2019 12/22/18   Okey Regal, PA-C  promethazine-dextromethorphan (PROMETHAZINE-DM) 6.25-15 MG/5ML syrup Take 5 mLs by mouth 4 (four) times daily as needed for cough. Patient not taking: Reported on 01/06/2019 11/28/18   Dalia Heading, PA-C   Physical Exam: Blood pressure (!) 143/105, pulse (!) 111, temperature 98.1 F (36.7 C), temperature source Oral, resp. rate 18, height 5' 2"  (1.575 m), weight 87 kg, SpO2 98 %. 1. General:  in No  Acute distress    Chronically ill  -appearing 2. Psychological: Alert and   Oriented 3. Head/ENT:   Dry Mucous Membranes                          Head Non traumatic, neck supple                            Poor Dentition 4. SKIN:   decreased Skin turgor,  Skin clean Dry and intact no rash 5. Heart: Regular rate and rhythm no  Murmur, no Rub or gallop 6. Lungs:   no wheezes or crackles   7.  Abdomen: Soft, RUQ tender, Non distended  Obese bowel sounds present 8. Lower extremities: no clubbing, cyanosis, trace edema 9. Neurologically Grossly intact, moving all 4 extremities equally   10. MSK: Normal range of motion   All other LABS:     Recent Labs  Lab 01/05/19 2001 01/09/19 1512  WBC 7.4 12.2*  NEUTROABS  --  10.1*  HGB 11.4* 11.5*  HCT 34.5* 36.2  MCV 100.3* 102.8*  PLT 409* 373     Recent Labs  Lab 01/05/19  2001 01/09/19 1514 01/09/19 1602 01/09/19 2000  NA 136 142  --   --   K 3.7 4.0  --   --   CL 102 107  --   --   CO2 21* 21*  --   --   GLUCOSE 93 72  --   --   BUN 6 5*  --   --   CREATININE 0.73 0.64  --   --   CALCIUM 9.5 9.1  --   --   MG  --   --  2.0  --   PHOS  --   --   --  4.1     Recent Labs  Lab 01/05/19 2001 01/09/19 1514  AST 79* 99*  ALT 77* 85*  ALKPHOS 115 126  BILITOT 1.4* 1.2  PROT 7.3 8.0  ALBUMIN 3.5 4.1    Cultures: No results found for: SDES, SPECREQUEST, CULT, REPTSTATUS   Radiological Exams on Admission: Dg Chest 2 View  Result Date: 01/09/2019 CLINICAL DATA:  Tachycardia EXAM: CHEST - 2 VIEW COMPARISON:  01/06/2011 FINDINGS: The heart size and mediastinal contours are within normal limits. Both lungs are clear. The visualized skeletal structures are unremarkable. IMPRESSION: No active cardiopulmonary disease. Electronically Signed   By: Inez Catalina M.D.   On: 01/09/2019 19:10    Chart has been reviewed    Assessment/Plan  38 y.o. female with medical history significant of gastric bypass,    Admitted for the whole withdrawal resulting in severe tachycardia  Present on Admission:  Tachycardia -has tachycardia which is persistent has been going on for almost a month now as per records.  Somewhat worse today.  Patient may be also withdrawing from alcohol.  In ER had a run of nonsustained V. tach for 30 beats.  TSH within normal limits will benefit from echogram.  At this point unclear etiology given obesity if d-dimer is elevated will check for PE otherwise rehydrate and continue to monitor may benefit from initiation of beta-blocker if no evidence of PE   . Alcohol abuse at this point patient is interested in quitting, order social work consult  . Elevated LFTs -persistent in the setting of alcohol abuse.  Prior ultrasound showed hepatic steatosis, for completion we will check lipid panel.  Strongly encourage patient to discontinue alcohol abuse  will help with withdrawal symptoms patient is interested in rehab  . Hepatic steatosis -most likely alcohol induced  . Alcohol withdrawal ([place])- order CIWA protocol  . Leukocytosis -unclear etiology monitor for any signs of infectious source rehydrate  Folic acid deficiency -will need to start supplementation, absorption issues order first dose IV she needs to have follow-up with bariatric team and make sure she is on appropriate supplementations Await results of MMA and homocystine she has borderline levels of B12 and evidence of neuropathy Gave on B12 deficiency also suspected will treat she may need an IM thousand micrograms dose as she also may be having issues with absorption  Vitamin D deficiency we will recheck vitamin D levels and will need to supplement if still low patient have had history of gastric bypass have not had any follow-up possibly has issues with absorption  Nonsustained ventricular tachycardia -noted in emergency department in the setting of persistent tachycardia and hypertension Monitor on telemetry and observe check all electrolytes. Would benefit from echogram.  And cardiology follow-up. TSH recently checked and was within normal limits. Patient have had persistent tachycardia in the setting of heavy alcohol intake.  Would benefit from evaluation for cardiomyopathy Pending echogram results may need cardiology consult   Lactic acidosis - unclear etiology in the setting of possible underlining liver disease, will continue to rehydrate and follow, no fever no sign of infection at this point. Will continue to monitor clinically  Encephalopathy - patient seem to be somewhat more confused and somnolent felt to be in the setting of Dilaudid gabapentin and Ativan. Will write for continuous pulse ox. And administer low-dose Narcan to see if there is any improvement Per notes initially patient presented with severe agitation complaining about her feet hurting  Pulmonary  embolism -small subsegmental PE noted will obtain echogram and Dopplers,  initiate heparin  Other plan as per orders.  DVT prophylaxis:    Lovenox     Code Status:  FULL CODE   as per patient      Family Communication:   Family not at  Bedside   Disposition Plan:      To home once workup is complete and patient is stable                                      Social Work  consulted                   Nutrition    consulted                                     Consults called: Discussed with cardiology please reconsult if needed patient will likely benefit from outpatient follow-up and set up at the time of discharge  Admission status:  ED Disposition    ED Disposition Condition [place]: [place] [100102]  Level of Care: Telemetry [5]  Admit to tele based on following criteria: Monitor for Ischemic changes  Covid Evaluation: N/A  Diagnosis: Alcohol withdrawal ([place]) [291.81.ICD-9-CM]  Admitting Physician: Toy Baker [[id number]]  Attending Physician: Toy Baker [[id number]]  PT Class (Do Not Modify): Observation [104]  PT Acc Code (Do Not Modify): Observation [10022]        Obs    Level of care    Tele  24H  please discontinue once patient no longer qualifies  Precautions:  NONE  No active isolations  PPE: Used by the provider:   P100  eye Goggles,  Gloves     Phung Kotas 01/09/2019, 10:04 PM    [facility]     after 2 AM please page floor coverage PA If 7AM-7PM, please contact the day team taking care of the patient using [email or web address]

## 2019-01-09 NOTE — Progress Notes (Signed)
ANTICOAGULATION CONSULT NOTE - Initial Consult  Pharmacy Consult for Heparin Indication: pulmonary embolus  Allergies  Allergen Reactions  . Ibuprofen     Gastric bypass surgery and history of gi bleed.    Patient Measurements: Height: 5\' 2"  (157.5 cm) Weight: 191 lb 12.8 oz (87 kg) IBW/kg (Calculated) : 50.1 Heparin Dosing Weight:   Vital Signs: Temp: 98.5 F (36.9 C) (05/23 2253) Temp Source: Oral (05/23 2253) BP: 151/107 (05/23 2253) Pulse Rate: 117 (05/23 2253)  Labs: Recent Labs    01/09/19 1512 01/09/19 1514 01/09/19 2000  HGB 11.5*  --   --   HCT 36.2  --   --   PLT 373  --   --   LABPROT  --   --  13.2  INR  --   --  1.0  CREATININE  --  0.64  --   CKTOTAL  --   --  51  TROPONINI  --   --  <0.03    Estimated Creatinine Clearance: 97.7 mL/min (by C-G formula based on SCr of 0.64 mg/dL).   Medical History: Past Medical History:  Diagnosis Date  . Neuropathy 01/09/2019    Medications:  Infusions:  . heparin      Assessment: Patient with new R/O PE.  + D-Dimer.  Baseline coags WNL.  MD ordered heparin per pharmacy.  Goal of Therapy:  Heparin level 0.3-0.7 units/ml Monitor platelets by anticoagulation protocol: Yes   Plan:  Heparin bolus 4000 units iv x1 Heparin drip at 1300  units/hr Daily CBC Next heparin level at 0800    Aleene Davidson [place] 01/09/2019,11:05 PM

## 2019-01-09 NOTE — ED Notes (Signed)
Pt requested ginger ale.

## 2019-01-09 NOTE — ED Notes (Signed)
Pt reminded of need for UA. Pt asks for pain meds to take effect first, as she is in 10/10 pain

## 2019-01-09 NOTE — ED Triage Notes (Addendum)
Patient arrived vie [facility] from home. Patient is AOx4 and ambulatory. Patient called 911 due to having severe pain in bilateral feet after putting [facility] on them and covering them with socks. Patient states that bilateral feet are swollen and did not start until after [facility] was put on. Feet are normal color, no loss in sensation, equal pedal pulses and no compromise to airway.  Hx of neuropathy per patient and not taking any medication.

## 2019-01-09 NOTE — ED Notes (Signed)
Pt ambulatory to restroom with Anteria NT

## 2019-01-09 NOTE — ED Notes (Signed)
ED TO INPATIENT HANDOFF REPORT  ED Nurse Name and Phone #:  Jannat Rosemeyer until 1900, [phone]  S Name/Age/Gender Stacy [name] 38 y.o. female Room/Bed: WA10/WA10  Code Status   Code Status: Not on file  Home/SNF/Other Home Patient oriented to: self, place, time and situation Is this baseline? Yes   Triage Complete: Triage complete  Chief Complaint foot pain  Triage Note Patient arrived vie [facility] from home. Patient is AOx4 and ambulatory. Patient called 911 due to having severe pain in bilateral feet after putting [facility] on them and covering them with socks. Patient states that bilateral feet are swollen and did not start until after [facility] was put on. Feet are normal color, no loss in sensation, equal pedal pulses and no compromise to airway.  Hx of neuropathy per patient and not taking any medication.   Allergies Allergies  Allergen Reactions  . Ibuprofen     Gastric bypass surgery and history of gi bleed.    Level of Care/Admitting Diagnosis ED Disposition    ED Disposition Condition Comment   Admit  The patient appears reasonably stabilized for admission considering the current resources, flow, and capabilities available in the ED at this time, and I doubt any other [facility] requiring further screening and/or treatment in the ED prior to admission is  present.       B Medical/Surgery History Past Medical History:  Diagnosis Date  . Neuropathy 01/09/2019   Past Surgical History:  Procedure Laterality Date  . GASTRIC BYPASS    . TONSILLECTOMY       A IV Location/Drains/Wounds Patient Lines/Drains/Airways Status   Active Line/Drains/Airways    Name:   Placement date:   Placement time:   Site:   Days:   Peripheral IV 01/09/19 Left Forearm   01/09/19    1540    Forearm   less than 1          Intake/Output Last 24 hours  Intake/Output Summary (Last 24 hours) at 01/09/2019 1843 Last data filed at 01/09/2019 1718 Gross per 24 hour  Intake 1000 ml  Output  -  Net 1000 ml    Labs/Imaging Results for orders placed or performed during the hospital encounter of 01/09/19 (from the past 48 hour(s))  CBC with Differential     Status: Abnormal   Collection Time: 01/09/19  3:12 PM  Result Value Ref Range   WBC 12.2 (H) 4.0 - 10.5 K/uL   RBC 3.52 (L) 3.87 - 5.11 MIL/uL   Hemoglobin 11.5 (L) 12.0 - 15.0 g/dL   HCT 35.5 73.2 - 20.2 %   MCV 102.8 (H) 80.0 - 100.0 fL   MCH 32.7 26.0 - 34.0 pg   MCHC 31.8 30.0 - 36.0 g/dL   RDW 54.2 (H) 70.6 - 23.7 %   Platelets 373 150 - 400 K/uL   nRBC 0.2 0.0 - 0.2 %   Neutrophils Relative % 83 %   Neutro Abs 10.1 (H) 1.7 - 7.7 K/uL   Lymphocytes Relative 12 %   Lymphs Abs 1.5 0.7 - 4.0 K/uL   Monocytes Relative 4 %   Monocytes Absolute 0.5 0.1 - 1.0 K/uL   Eosinophils Relative 1 %   Eosinophils Absolute 0.1 0.0 - 0.5 K/uL   Basophils Relative 0 %   Basophils Absolute 0.0 0.0 - 0.1 K/uL   Immature Granulocytes 0 %   Abs Immature Granulocytes 0.04 0.00 - 0.07 K/uL    Comment: Performed at [facility], [address] [place], [place] [address]  Ethanol     Status: None   Collection Time: 01/09/19  3:12 PM  Result Value Ref Range   Alcohol, Ethyl (B) <10 <10 mg/dL    Comment: (NOTE) Lowest detectable limit for serum alcohol is 10 mg/dL. For medical purposes only. Performed at [facility], [address] [place], [place] [address]   Comprehensive metabolic panel     Status: Abnormal   Collection Time: 01/09/19  3:14 PM  Result Value Ref Range   Sodium 142 135 - 145 mmol/L   Potassium 4.0 3.5 - 5.1 mmol/L   Chloride 107 98 - 111 mmol/L   CO2 21 (L) 22 - 32 mmol/L   Glucose, Bld 72 70 - 99 mg/dL   BUN 5 (L) 6 - 20 mg/dL   Creatinine, Ser 0.980.64 0.44 - 1.00 mg/dL   Calcium 9.1 8.9 - 11.910.3 mg/dL   Total Protein 8.0 6.5 - 8.1 g/dL   Albumin 4.1 3.5 - 5.0 g/dL   AST 99 (H) 15 - 41 U/L   ALT 85 (H) 0 - 44 U/L   Alkaline Phosphatase 126 38 - 126 U/L   Total  Bilirubin 1.2 0.3 - 1.2 mg/dL   GFR calc non Af Amer >60 >60 mL/min   GFR calc Af Amer >60 >60 mL/min   Anion gap 14 5 - 15    Comment: Performed at [facility], [address] [place], [place] [address]  CBG monitoring, ED     Status: None   Collection Time: 01/09/19  3:21 PM  Result Value Ref Range   Glucose-Capillary 73 70 - 99 mg/dL  Magnesium     Status: None   Collection Time: 01/09/19  4:02 PM  Result Value Ref Range   Magnesium 2.0 1.7 - 2.4 mg/dL    Comment: Performed at [facility], [address] [place], [place] [address]  Urinalysis, Routine w reflex microscopic     Status: Abnormal   Collection Time: 01/09/19  5:35 PM  Result Value Ref Range   Color, Urine YELLOW YELLOW   APPearance HAZY (A) CLEAR   Specific Gravity, Urine 1.019 1.005 - 1.030   pH 5.0 5.0 - 8.0   Glucose, UA NEGATIVE NEGATIVE mg/dL   Hgb urine dipstick NEGATIVE NEGATIVE   Bilirubin Urine NEGATIVE NEGATIVE   Ketones, ur NEGATIVE NEGATIVE mg/dL   Protein, ur NEGATIVE NEGATIVE mg/dL   Nitrite NEGATIVE NEGATIVE   Leukocytes,Ua NEGATIVE NEGATIVE    Comment: Performed at [facility], [address] [place], [place] [address]  Urine rapid drug screen (hosp performed)     Status: Abnormal   Collection Time: 01/09/19  5:35 PM  Result Value Ref Range   Opiates POSITIVE (A) NONE DETECTED   Cocaine NONE DETECTED NONE DETECTED   Benzodiazepines NONE DETECTED NONE DETECTED   Amphetamines NONE DETECTED NONE DETECTED   Tetrahydrocannabinol NONE DETECTED NONE DETECTED   Barbiturates NONE DETECTED NONE DETECTED    Comment: (NOTE) DRUG SCREEN FOR MEDICAL PURPOSES ONLY.  IF CONFIRMATION IS NEEDED FOR ANY PURPOSE, NOTIFY LAB WITHIN 5 DAYS. LOWEST DETECTABLE LIMITS FOR URINE DRUG SCREEN Drug Class                     Cutoff (ng/mL) Amphetamine and metabolites    1000 Barbiturate and metabolites    200 Benzodiazepine  200 Tricyclics  and metabolites     300 Opiates and metabolites        300 Cocaine and metabolites        300 THC                            50 Performed at [facility], [address] [place], [place] [address]    No results found.  Pending Labs Unresulted Labs (From admission, onward)    Start     Ordered   01/09/19 1843  D-dimer, quantitative  Add-on,   STAT     01/09/19 1842   01/09/19 1843  Folate  Add-on,   STAT     01/09/19 1842   01/09/19 1843  Vitamin B12  Add-on,   STAT     01/09/19 1842   01/09/19 1815  Pregnancy, urine  Add-on,   STAT     01/09/19 1814   01/09/19 1815  SARS Coronavirus 2 (CEPHEID - Performed in [facility] hospital lab), Hosp Order  (Asymptomatic Patients Labs)  Once,   R    Question:  Rule Out  Answer:  Yes   01/09/19 1814          Vitals/Pain Today's Vitals   01/09/19 1700 01/09/19 1750 01/09/19 1800 01/09/19 1830  BP: (!) 150/103  (!) 128/103 (!) 143/105  Pulse: (!) 134  (!) 128 (!) 111  Resp: 20  (!) 24 18  Temp:      TempSrc:      SpO2: 99%  97% 98%  Weight:      Height:      PainSc:  7       Isolation Precautions No active isolations  Medications Medications  lactated ringers bolus 1,000 mL (0 mLs Intravenous Stopped 01/09/19 1718)  HYDROmorphone (DILAUDID) injection 1 mg (1 mg Intravenous Given 01/09/19 1541)  gabapentin (NEURONTIN) capsule 600 mg (600 mg Oral Given 01/09/19 1539)  lactated ringers bolus 1,000 mL (1,000 mLs Intravenous New Bag/Given 01/09/19 1717)  HYDROmorphone (DILAUDID) injection 1 mg (1 mg Intravenous Given 01/09/19 1718)    Mobility walks Low fall risk   Focused Assessments     R Recommendations: See Admitting Provider Note  Report given to:   Additional Notes:

## 2019-01-10 ENCOUNTER — Observation Stay (HOSPITAL_COMMUNITY): Payer: Medicaid Other

## 2019-01-10 DIAGNOSIS — E872 Acidosis: Secondary | ICD-10-CM | POA: Diagnosis present

## 2019-01-10 DIAGNOSIS — G629 Polyneuropathy, unspecified: Secondary | ICD-10-CM | POA: Diagnosis present

## 2019-01-10 DIAGNOSIS — G9341 Metabolic encephalopathy: Secondary | ICD-10-CM | POA: Diagnosis present

## 2019-01-10 DIAGNOSIS — R945 Abnormal results of liver function studies: Secondary | ICD-10-CM | POA: Diagnosis not present

## 2019-01-10 DIAGNOSIS — R609 Edema, unspecified: Secondary | ICD-10-CM | POA: Diagnosis not present

## 2019-01-10 DIAGNOSIS — R9431 Abnormal electrocardiogram [ECG] [EKG]: Secondary | ICD-10-CM

## 2019-01-10 DIAGNOSIS — Z1159 Encounter for screening for other viral diseases: Secondary | ICD-10-CM | POA: Diagnosis not present

## 2019-01-10 DIAGNOSIS — I10 Essential (primary) hypertension: Secondary | ICD-10-CM | POA: Diagnosis present

## 2019-01-10 DIAGNOSIS — F1010 Alcohol abuse, uncomplicated: Secondary | ICD-10-CM | POA: Diagnosis not present

## 2019-01-10 DIAGNOSIS — F10230 Alcohol dependence with withdrawal, uncomplicated: Secondary | ICD-10-CM | POA: Diagnosis not present

## 2019-01-10 DIAGNOSIS — I2693 Single subsegmental pulmonary embolism without acute cor pulmonale: Secondary | ICD-10-CM | POA: Diagnosis present

## 2019-01-10 DIAGNOSIS — F17210 Nicotine dependence, cigarettes, uncomplicated: Secondary | ICD-10-CM | POA: Diagnosis present

## 2019-01-10 DIAGNOSIS — R7989 Other specified abnormal findings of blood chemistry: Secondary | ICD-10-CM | POA: Diagnosis not present

## 2019-01-10 DIAGNOSIS — F419 Anxiety disorder, unspecified: Secondary | ICD-10-CM | POA: Diagnosis present

## 2019-01-10 DIAGNOSIS — E669 Obesity, unspecified: Secondary | ICD-10-CM | POA: Diagnosis present

## 2019-01-10 DIAGNOSIS — E559 Vitamin D deficiency, unspecified: Secondary | ICD-10-CM | POA: Diagnosis present

## 2019-01-10 DIAGNOSIS — I2699 Other pulmonary embolism without acute cor pulmonale: Secondary | ICD-10-CM

## 2019-01-10 DIAGNOSIS — I472 Ventricular tachycardia: Secondary | ICD-10-CM | POA: Diagnosis present

## 2019-01-10 DIAGNOSIS — R52 Pain, unspecified: Secondary | ICD-10-CM | POA: Diagnosis not present

## 2019-01-10 DIAGNOSIS — D539 Nutritional anemia, unspecified: Secondary | ICD-10-CM | POA: Diagnosis present

## 2019-01-10 DIAGNOSIS — Z9884 Bariatric surgery status: Secondary | ICD-10-CM | POA: Diagnosis not present

## 2019-01-10 DIAGNOSIS — E538 Deficiency of other specified B group vitamins: Secondary | ICD-10-CM | POA: Diagnosis present

## 2019-01-10 DIAGNOSIS — K760 Fatty (change of) liver, not elsewhere classified: Secondary | ICD-10-CM | POA: Diagnosis present

## 2019-01-10 DIAGNOSIS — Z79899 Other long term (current) drug therapy: Secondary | ICD-10-CM | POA: Diagnosis not present

## 2019-01-10 DIAGNOSIS — G8929 Other chronic pain: Secondary | ICD-10-CM | POA: Diagnosis present

## 2019-01-10 LAB — CBC
HCT: 33.8 % — ABNORMAL LOW (ref 36.0–46.0)
Hemoglobin: 10.9 g/dL — ABNORMAL LOW (ref 12.0–15.0)
MCH: 34.2 pg — ABNORMAL HIGH (ref 26.0–34.0)
MCHC: 32.2 g/dL (ref 30.0–36.0)
MCV: 106 fL — ABNORMAL HIGH (ref 80.0–100.0)
Platelets: 285 10*3/uL (ref 150–400)
RBC: 3.19 MIL/uL — ABNORMAL LOW (ref 3.87–5.11)
RDW: 18.2 % — ABNORMAL HIGH (ref 11.5–15.5)
WBC: 8.1 10*3/uL (ref 4.0–10.5)
nRBC: 0.2 % (ref 0.0–0.2)

## 2019-01-10 LAB — HEPARIN LEVEL (UNFRACTIONATED)
Heparin Unfractionated: 0.31 IU/mL (ref 0.30–0.70)
Heparin Unfractionated: 0.47 IU/mL (ref 0.30–0.70)

## 2019-01-10 LAB — ECHOCARDIOGRAM COMPLETE
Height: 62 in
Weight: 3068.8 oz

## 2019-01-10 LAB — TROPONIN I
Troponin I: <0.03 ng/mL (ref <0.03)
Troponin I: <0.03 ng/mL (ref <0.03)

## 2019-01-10 LAB — LACTIC ACID, PLASMA
Lactic Acid, Venous: 1.3 mmol/L (ref 0.5–1.9)
Lactic Acid, Venous: 2.3 mmol/L (ref 0.5–1.9)

## 2019-01-10 LAB — AMMONIA: Ammonia: 35 umol/L (ref 9–35)

## 2019-01-10 MED ORDER — FOLIC ACID 1 MG PO TABS
1.0000 mg | ORAL_TABLET | Freq: Every day | ORAL | Status: DC
  Administered 2019-01-10 – 2019-01-15 (×6): 1 mg via ORAL
  Filled 2019-01-10 (×6): qty 1

## 2019-01-10 MED ORDER — LORAZEPAM 1 MG PO TABS
1.0000 mg | ORAL_TABLET | Freq: Four times a day (QID) | ORAL | Status: AC | PRN
  Administered 2019-01-10 – 2019-01-12 (×8): 1 mg via ORAL
  Filled 2019-01-10 (×8): qty 1

## 2019-01-10 MED ORDER — SODIUM CHLORIDE 0.9 % IV SOLN: INTRAVENOUS | Status: DC

## 2019-01-10 MED ORDER — GABAPENTIN 100 MG PO CAPS
200.0000 mg | ORAL_CAPSULE | Freq: Three times a day (TID) | ORAL | Status: DC
  Administered 2019-01-10 – 2019-01-11 (×3): 200 mg via ORAL
  Filled 2019-01-10 (×3): qty 2

## 2019-01-10 MED ORDER — HEPARIN (PORCINE) 25000 UT/250ML-% IV SOLN
1400.0000 [IU]/h | INTRAVENOUS | Status: DC
  Administered 2019-01-10 – 2019-01-12 (×2): 1400 [IU]/h via INTRAVENOUS
  Filled 2019-01-10 (×6): qty 250

## 2019-01-10 MED ORDER — LABETALOL HCL 5 MG/ML IV SOLN
10.0000 mg | INTRAVENOUS | Status: DC | PRN
  Administered 2019-01-10 – 2019-01-14 (×7): 10 mg via INTRAVENOUS
  Filled 2019-01-10 (×9): qty 4

## 2019-01-10 MED ORDER — LORAZEPAM 2 MG/ML IJ SOLN: 1.0000 mg | Freq: Four times a day (QID) | INTRAMUSCULAR | Status: AC | PRN

## 2019-01-10 MED ORDER — VITAMIN B-1 100 MG PO TABS
100.0000 mg | ORAL_TABLET | Freq: Every day | ORAL | Status: DC
  Administered 2019-01-10 – 2019-01-15 (×6): 100 mg via ORAL
  Filled 2019-01-10 (×6): qty 1

## 2019-01-10 MED ORDER — GABAPENTIN 300 MG PO CAPS
600.0000 mg | ORAL_CAPSULE | Freq: Once | ORAL | Status: AC
  Administered 2019-01-10: 04:00:00 600 mg via ORAL
  Filled 2019-01-10: qty 2

## 2019-01-10 MED ORDER — OXYCODONE-ACETAMINOPHEN 7.5-325 MG PO TABS
1.0000 | ORAL_TABLET | Freq: Four times a day (QID) | ORAL | Status: DC | PRN
  Administered 2019-01-10 – 2019-01-15 (×15): 1 via ORAL
  Filled 2019-01-10 (×16): qty 1

## 2019-01-10 MED ORDER — VITAMIN D (ERGOCALCIFEROL) 1.25 MG (50000 UNIT) PO CAPS
50000.0000 [IU] | ORAL_CAPSULE | ORAL | Status: DC
  Administered 2019-01-10: 13:00:00 50000 [IU] via ORAL
  Filled 2019-01-10: qty 1

## 2019-01-10 MED ORDER — ADULT MULTIVITAMIN W/MINERALS CH
1.0000 | ORAL_TABLET | Freq: Every day | ORAL | Status: DC
  Administered 2019-01-10 – 2019-01-15 (×6): 1 via ORAL
  Filled 2019-01-10 (×7): qty 1

## 2019-01-10 MED ORDER — SODIUM CHLORIDE 0.9 % IV SOLN
INTRAVENOUS | Status: DC
  Administered 2019-01-10: 18:00:00 via INTRAVENOUS

## 2019-01-10 NOTE — Progress Notes (Signed)
When pt arrived on unit writer received call from Dr.Green. ps test results show RLL acute PE with small burden and no heart strain. Text paged hospitalist and [place]. Awaiting response

## 2019-01-10 NOTE — TOC Initial Note (Signed)
Transition of Care [facility]) - Initial/Assessment Note    Patient Details  Name: Stacy [name] MRN: [id number] Date of Birth: Apr 01, 1981  Transition of Care (TOC) CM/SW Contact:    Armanda Heritage, RN Phone Number: 01/10/2019, 9:09 AM  Clinical Narrative:   Pt with active medicaid, [place] medicaid preferred formulary is generic lovenox which pt should be able to get for a low cost.                   Expected Discharge Plan: Home/Self Care Barriers to Discharge: Continued Medical Work up   Patient Goals and CMS Choice        Expected Discharge Plan and Services Expected Discharge Plan: Home/Self Care                                              Prior Living Arrangements/Services                       Activities of Daily Living      Permission Sought/Granted                  Emotional Assessment Appearance:: Appears stated age Attitude/Demeanor/Rapport: Engaged Affect (typically observed): Accepting Orientation: : Oriented to Self, Oriented to Place, Oriented to  Time, Oriented to Situation   Psych Involvement: No (comment)  Admission diagnosis:  Ventricular tachycardia (HCC) [I47.2] Tachycardia [R00.0] Alcohol withdrawal (HCC) [F10.239] Patient Active Problem List   Diagnosis Date Noted  . Leukocytosis 01/09/2019  . Alcohol abuse 01/09/2019  . Elevated LFTs 01/09/2019  . Hepatic steatosis 01/09/2019  . Alcohol dependence with uncomplicated withdrawal (HCC) 01/09/2019  . Alcohol withdrawal (HCC) 01/09/2019  . Vitamin D deficiency 01/09/2019  . Folic acid deficiency 01/09/2019  . Non-sustained ventricular tachycardia (HCC) 01/09/2019  . Lactic acidosis 01/09/2019  . Pulmonary embolism (HCC) 01/09/2019  . Acute metabolic encephalopathy 01/09/2019  . Closed fracture of distal end of left humerus 04/29/18 05/06/2018   PCP:  Cristy Folks, MD Pharmacy:   [facility] [phone] - [place], [place] - [address] AT [facility]  [address] [facility] [address] [place] [address] Phone: [phone] Fax: [phone]     Social Determinants of Health (SDOH) Interventions    Readmission Risk Interventions No flowsheet data found.

## 2019-01-10 NOTE — Care Management Note (Signed)
Case Management Note  Patient Details  Name: Stacy [name] MRN: [id number] Date of Birth: 29-Jul-1981  Subjective/Objective:        Social work was consulted for substance abuse support. CSW met with patient today on 01/10/2019 and provided her with substance abuse support resource education. CSW provided printed handouts for patient to keep and revert back to. However, patient reports already having inpatient treatment care set up in place at [place], [place]. Patient is agreeable to keep resources and ulitilize them if that treatment option does not end up working out. Patient appreciative of support. CSW met with patient's nurse and discussed plan of care as well. Patient was advised to contact social work department if she has any future questions or concerns in relation to SA treatment. Social work will sign off at this time.  Greg Cutter, LCSW 01/10/2019, 3:57 PM

## 2019-01-10 NOTE — Progress Notes (Signed)
Pt arrived on unit via stretcher room 1512. Lethargic and confused. Gown placed on pt callbell within reach. Pt on tele box 51. Initial assessment completed. Will continue to monitor

## 2019-01-10 NOTE — Progress Notes (Signed)
ANTICOAGULATION CONSULT NOTE  Pharmacy Consult for Heparin Indication: pulmonary embolus  Allergies  Allergen Reactions  . Ibuprofen     Gastric bypass surgery and history of gi bleed.    Patient Measurements: Height: 5\' 2"  (157.5 cm) Weight: 191 lb 12.8 oz (87 kg) IBW/kg (Calculated) : 50.1 Heparin Dosing Weight: 70 kg  Vital Signs: Temp: 98.9 F (37.2 C) (05/24 1008) Temp Source: Oral (05/24 0603) BP: 141/104 (05/24 1408) Pulse Rate: 118 (05/24 1408)  Labs: Recent Labs    01/09/19 1512 01/09/19 1514 01/09/19 2000 01/10/19 0204 01/10/19 0813 01/10/19 1618  HGB 11.5*  --   --  10.9*  --   --   HCT 36.2  --   --  33.8*  --   --   PLT 373  --   --  285  --   --   LABPROT  --   --  13.2  --   --   --   INR  --   --  1.0  --   --   --   HEPARINUNFRC  --   --   --   --  0.31 0.47  CREATININE  --  0.64  --   --   --   --   CKTOTAL  --   --  51  --   --   --   TROPONINI  --   --  <0.03 <0.03 <0.03  --     Estimated Creatinine Clearance: 97.7 mL/min (by C-G formula based on SCr of 0.64 mg/dL).   Medical History: Past Medical History:  Diagnosis Date  . Neuropathy 01/09/2019    Medications:  Infusions:  . sodium chloride 75 mL/hr at 01/10/19 1230  . heparin 1,400 Units/hr (01/10/19 1011)    Assessment: Patient with small segmental PE. Dopplers neg for DVT.  Baseline coags WNL.  Heparin per pharmacy for PE.   01/10/2019 Heparin level is therapeutic at 0.47 after rate increased to 1400 units/hr.   No bleeding reported  Goal of Therapy:  Heparin level 0.3-0.7 units/ml Monitor platelets by anticoagulation protocol: Yes   Plan:  continuue heparin drip at 1400 units/hr  Daily CBC and HL while on heparin F/u for long term anticoagulation plan- DOACs not recommended for gastric bypass pts.  rec LMWH/coumadin  Herby Abraham, Pharm.D 01/10/2019 5:02 PM

## 2019-01-10 NOTE — Progress Notes (Addendum)
ANTICOAGULATION CONSULT NOTE  Pharmacy Consult for Heparin Indication: pulmonary embolus  Allergies  Allergen Reactions  . Ibuprofen     Gastric bypass surgery and history of gi bleed.    Patient Measurements: Height: 5\' 2"  (157.5 cm) Weight: 191 lb 12.8 oz (87 kg) IBW/kg (Calculated) : 50.1 Heparin Dosing Weight: 70 kg  Vital Signs: Temp: 98.7 F (37.1 C) (05/24 0603) Temp Source: Oral (05/24 0603) BP: 135/89 (05/24 0603) Pulse Rate: 124 (05/24 0603)  Labs: Recent Labs    01/09/19 1512 01/09/19 1514 01/09/19 2000 01/10/19 0204 01/10/19 0813  HGB 11.5*  --   --  10.9*  --   HCT 36.2  --   --  33.8*  --   PLT 373  --   --  285  --   LABPROT  --   --  13.2  --   --   INR  --   --  1.0  --   --   HEPARINUNFRC  --   --   --   --  0.31  CREATININE  --  0.64  --   --   --   CKTOTAL  --   --  51  --   --   TROPONINI  --   --  <0.03 <0.03 <0.03    Estimated Creatinine Clearance: 97.7 mL/min (by C-G formula based on SCr of 0.64 mg/dL).   Medical History: Past Medical History:  Diagnosis Date  . Neuropathy 01/09/2019    Medications:  Infusions:  . sodium chloride    . heparin 1,300 Units/hr (01/10/19 0600)    Assessment: Patient with small segmental PE. Dopplers neg for DVT.  Baseline coags WNL.  Heparin per pharmacy for PE.   01/10/2019 First heparin level 0.31 after 4000 unit bolus and heparin drip at 1300 units/hr.  Hg 10.9, PLTC WNL.  No bleeding reported.  HL at low end of desired goal.   Goal of Therapy:  Heparin level 0.3-0.7 units/ml Monitor platelets by anticoagulation protocol: Yes   Plan:  Increase heparin drip to 1400 units/hr and check 6 hr HL Daily CBC and HL while on heparin F/u for long term anticoagulation plan- DOACs not recommended for gastric bypass pts.  rec LMWH/coumadin  Herby Abraham, Pharm.D 01/10/2019 10:01 AM

## 2019-01-10 NOTE — Progress Notes (Signed)
Bilateral lower extremity venous duplex has been completed. Preliminary results can be found in CV Proc through chart review.   01/10/19 8:43 AM Olen Cordial RVT

## 2019-01-10 NOTE — Progress Notes (Signed)
[name] NOTE    CIGI [name]  IDC:[id number] DOB: December [name] [name] DOA: 01/09/2019 PCP: Cristy Folks, [name]   Brief Narrative: Patient is a 38 year old female with history of gastric bypass surgery, obesity, chronic alcohol abuse who presents to the emergency department with complaints of bilateral foot pain.  She also reported heavy alcohol consumption.  See has history of chronic back pain.  Patient described bilateral foot pain as tingling and numbness from her thighs down to her feet  with no weakness.  Patient reported history of vitamin B12 deficiency.  Patient was noted to have hypertension, tachycardia, elevated liver enzymes on presentation.  D-dimer was elevated.  CT Angie of chest showed pulmonary embolism on the right lower lobe.  Started on heparin drip.  Being monitored for alcohol withdrawal.  Assessment & Plan:   Active Problems:   Leukocytosis   Alcohol abuse   Elevated LFTs   Hepatic steatosis   Alcohol dependence with uncomplicated withdrawal (HCC)   Alcohol withdrawal (HCC)   Vitamin D deficiency   Folic acid deficiency   Non-sustained ventricular tachycardia (HCC)   Lactic acidosis   Pulmonary embolism (HCC)   Acute metabolic encephalopathy   Acute pulmonary embolism: CT angios showed right lower lobe pulmonary emboli.  No right ventricular strain.  Started on heparin drip.  Will change to DOAC soon.  Bilateral lower extremity duplex did not show any DVT.  Sinus tachycardia: Multifactorial.  Might be secondary to PE, could be associated  with alcohol withdrawal.  Her alcohol level was normal on presentation.  Follow-up echocardiogram.  Continue gentle IV fluids.  Chronic alcohol abuse: Drinks daily.  Usually 3 or 4 glasses of wine.  Alcohol level was normal on presentation.  Continue to monitor CIWA protocol.  Continue thiamine folic acid.  Patient is planning to go to alcohol rehabilitation and she has the resources.  Folic acid deficiency/macrocytic anemia:  Continue folic acid supplementation.  Currently hemoglobin stable  History of vitamin B12 deficiency: Vitamin B 12 level normal as per 12/22/2018.  Levels being repeated.  Also follow-up methylmalonic acid and homocystine.  Hepatic steatosis: Most likely associated with chronic alcohol abuse.  Presented with mild elevated liver enzymes.  Continue to monitor  Bilateral lower extremity pain: Complains of numbness, tingling of bilateral lower extremities.  Most likely neuropathy which could be associated with alcoholism, vitamin B 12 deficiency.  Check hemoglobin A1c level to rule out diabetic peripheral neuropathy.  Continue gabapentin  Vitamin D deficiency: Continue supplementation  Lactic acidosis: Resolved  Encephalopathy: She was confused on presentation.  Currently alert and oriented.         DVT prophylaxis: Lovenox Code Status: Full Family Communication: None present at the bedside Disposition Plan: Home in 1 to 2 days   Consultants: None  Procedures: None  Antimicrobials:  Anti-infectives (From admission, onward)   None      Subjective: Patient seen and examined the bedside this morning.  Remains tachycardic, mildly hypertensive.  Complains of bilateral lower extremity pain.  This morning she was alert and oriented and very emotional.  Objective: Vitals:   01/10/19 0027 01/10/19 0217 01/10/19 0603 01/10/19 1008  BP: (!) 137/103 (!) 147/107 135/89 (!) 145/113  Pulse: (!) 102 (!) 111 (!) 124 (!) 125  Resp: 14 [name] 15    Temp: 99.1 F (37.3 C) 98.4 F (36.9 C) 98.7 F (37.1 C) 98.9 F (37.2 C)  TempSrc: Oral Oral Oral   SpO2: 100% 100% 97% 100%  Weight:      Height:  Intake/Output Summary (Last 24 hours) at 01/10/2019 1146 Last data filed at 01/10/2019 0600 Gross per 24 hour  Intake 2074.08 ml  Output --  Net 2074.08 ml   Filed Weights   01/09/19 1459  Weight: 87 kg    Examination:  General exam: Appears calm and comfortable ,Not in  distress,obese HEENT:PERRL,Oral mucosa moist, Ear/Nose normal on gross exam Respiratory system: Bilateral equal air entry, normal vesicular breath sounds, no wheezes or crackles  Cardiovascular system: Sinus tachycardia, RRR. No JVD, murmurs, rubs, gallops or clicks. No pedal edema. Gastrointestinal system: Abdomen is nondistended, soft and nontender. No organomegaly or masses felt. Normal bowel sounds heard. Central nervous system: Alert and oriented. No focal neurological deficits. Extremities: No edema, no clubbing ,no cyanosis, distal peripheral pulses palpable. Skin: No rashes, lesions or ulcers,no icterus ,no pallor MSK: Normal muscle bulk,tone ,power Psychiatry: Judgement and insight appear normal. Mood & affect appropriate.     Data Reviewed: I have personally reviewed following labs and imaging studies  CBC: Recent Labs  Lab 01/05/19 2001 01/09/19 1512 01/10/19 0204  WBC 7.4 12.2* 8.1  NEUTROABS  --  10.1*  --   HGB 11.4* 11.5* 10.9*  HCT 34.5* 36.2 33.8*  MCV 100.3* 102.8* 106.0*  PLT 409* 373 285   Basic Metabolic Panel: Recent Labs  Lab 01/05/19 2001 01/09/19 1514 01/09/19 1602 01/09/19 2000  NA 136 142  --   --   K 3.7 4.0  --   --   CL 102 107  --   --   CO2 21* 21*  --   --   GLUCOSE 93 72  --   --   BUN 6 5*  --   --   CREATININE 0.73 0.64  --   --   CALCIUM 9.5 9.1  --   --   MG  --   --  2.0  --   PHOS  --   --   --  4.1   GFR: Estimated Creatinine Clearance: 97.7 mL/min (by C-G formula based on SCr of 0.64 mg/dL). Liver Function Tests: Recent Labs  Lab 01/05/19 2001 01/09/19 1514  AST 79* 99*  ALT 77* 85*  ALKPHOS 115 126  BILITOT 1.4* 1.2  PROT 7.3 8.0  ALBUMIN 3.5 4.1   Recent Labs  Lab 01/05/19 2001 01/09/19 2000  LIPASE 83* 53*   Recent Labs  Lab 01/10/19 0204  AMMONIA 35   Coagulation Profile: Recent Labs  Lab 01/09/19 2000  INR 1.0   Cardiac Enzymes: Recent Labs  Lab 01/09/19 2000 01/10/19 0204 01/10/19 0813   CKTOTAL 51  --   --   TROPONINI <0.03 <0.03 <0.03   BNP (last 3 results) No results for input(s): PROBNP in the last 8760 hours. HbA1C: No results for input(s): HGBA1C in the last 72 hours. CBG: Recent Labs  Lab 01/09/19 1521  GLUCAP 73   Lipid Profile: No results for input(s): CHOL, HDL, LDLCALC, TRIG, CHOLHDL, LDLDIRECT in the last 72 hours. Thyroid Function Tests: No results for input(s): TSH, T4TOTAL, FREET4, T3FREE, THYROIDAB in the last 72 hours. Anemia Panel: Recent Labs    01/09/19 2000  VITAMINB12 248  FOLATE 2.1*   Sepsis Labs: Recent Labs  Lab 01/09/19 2000 01/10/19 0001 01/10/19 0204  LATICACIDVEN 3.1* 2.3* 1.3    Recent Results (from the past 240 hour(s))  SARS Coronavirus 2 (CEPHEID - Performed in [facility] hospital lab), Hosp Order     Status: None   Collection Time: 01/09/19  6:27 PM  Result Value Ref Range Status   SARS Coronavirus 2 NEGATIVE NEGATIVE Final    Comment: (NOTE) If result is NEGATIVE SARS-CoV-2 target nucleic acids are NOT DETECTED. The SARS-CoV-2 RNA is generally detectable in upper and lower  respiratory specimens during the acute phase of infection. The lowest  concentration of SARS-CoV-2 viral copies this assay can detect is 250  copies / mL. A negative result does not preclude SARS-CoV-2 infection  and should not be used as the sole basis for treatment or other  patient management decisions.  A negative result may occur with  improper specimen collection / handling, submission of specimen other  than nasopharyngeal swab, presence of viral mutation(s) within the  areas targeted by this assay, and inadequate number of viral copies  (<250 copies / mL). A negative result must be combined with clinical  observations, patient history, and epidemiological information. If result is POSITIVE SARS-CoV-2 target nucleic acids are DETECTED. The SARS-CoV-2 RNA is generally detectable in upper and lower  respiratory specimens dur ing  the acute phase of infection.  Positive  results are indicative of active infection with SARS-CoV-2.  Clinical  correlation with patient history and other diagnostic information is  necessary to determine patient infection status.  Positive results do  not rule out bacterial infection or co-infection with other viruses. If result is PRESUMPTIVE POSTIVE SARS-CoV-2 nucleic acids MAY BE PRESENT.   A presumptive positive result was obtained on the submitted specimen  and confirmed on repeat testing.  While 2019 novel coronavirus  (SARS-CoV-2) nucleic acids may be present in the submitted sample  additional confirmatory testing may be necessary for epidemiological  and / or clinical management purposes  to differentiate between  SARS-CoV-2 and other Sarbecovirus currently known to infect humans.  If clinically indicated additional testing with an alternate test  methodology [phone]) is advised. The SARS-CoV-2 RNA is generally  detectable in upper and lower respiratory sp ecimens during the acute  phase of infection. The expected result is Negative. Fact Sheet for Patients:  BoilerBrush.com.cy Fact Sheet for Healthcare Providers: https://pope.com/ This test is not yet approved or cleared by the [place] FDA and has been authorized for detection and/or diagnosis of SARS-CoV-2 by FDA under an Emergency Use Authorization (EUA).  This EUA will remain in effect (meaning this test can be used) for the duration of the COVID-19 declaration under Section 564(b)(1) of the Act, 21 U.S.C. section 360bbb-3(b)(1), unless the authorization is terminated or revoked sooner. Performed at [facility], [address], [place], [place] [address]          Radiology Studies: Dg Chest 2 View  Result Date: 01/09/2019 CLINICAL DATA:  Tachycardia EXAM: CHEST - 2 VIEW COMPARISON:  01/06/2011 FINDINGS: The heart size and mediastinal  contours are within normal limits. Both lungs are clear. The visualized skeletal structures are unremarkable. IMPRESSION: No active cardiopulmonary disease. Electronically Signed   By: Alcide Clever M.D.   On: 01/09/2019 19:10   Ct Angio Chest Pe W Or Wo Contrast  Result Date: 01/09/2019 CLINICAL DATA:  Angina.  Dyspnea. EXAM: CT ANGIOGRAPHY CHEST WITH CONTRAST TECHNIQUE: Multidetector CT imaging of the chest was performed using the standard protocol during bolus administration of intravenous contrast. Multiplanar CT image reconstructions and MIPs were obtained to evaluate the vascular anatomy. CONTRAST:  OMNIPAQUE IOHEXOL 350 MG/ML SOLN COMPARISON:  No recent relevant prior available for comparison. Correlation made with chest x-ray from the same day. FINDINGS: Cardiovascular: There is  an acute segmental pulmonary embolus involving the right medial basilar segment of the right lower lobe. There are additional subsegmental pulmonary emboli involving the right lower lobe. There is no CT evidence of right heart strain. There is no pericardial effusion. No aortic dissection. The main pulmonary artery is dilated measuring 3.1 cm. Mediastinum/Nodes: No enlarged mediastinal, hilar, or axillary lymph nodes. Thyroid gland, trachea, and esophagus demonstrate no significant findings. Lungs/Pleura: Lungs are clear. No pleural effusion or pneumothorax. Upper Abdomen: There is severe hepatic steatosis. The patient appears to be status post prior gastric bypass. Musculoskeletal: No chest wall abnormality. No acute or significant osseous findings. Review of the MIP images confirms the above findings. IMPRESSION: 1. Acute segmental and subsegmental pulmonary emboli primarily involving the right lower lobe as detailed above. The overall burden is low. There is no CT evidence of right heart strain. The main pulmonary artery is dilated which can be seen in patients with elevated PA pressures. 2. Severe hepatic steatosis. 3.  The lungs are clear. These results were called by telephone at the time of interpretation on 01/09/2019 at 10:49 pm to RN Jean Rosenthal, who verbally acknowledged these results. Electronically Signed   By: Katherine Mantle M.D.   On: 01/09/2019 22:51   Vas [place] Lower Extremity Venous (dvt)  Result Date: 01/10/2019  Lower Venous Study Indications: Edema.  Performing Technologist: Chanda Busing RVT  Examination Guidelines: A complete evaluation includes B-mode imaging, spectral Doppler, color Doppler, and power Doppler as needed of all accessible portions of each vessel. Bilateral testing is considered an integral part of a complete examination. Limited examinations for reoccurring indications may be performed as noted.  +---------+---------------+---------+-----------+----------+-------+  RIGHT     Compressibility Phasicity Spontaneity Properties Summary  +---------+---------------+---------+-----------+----------+-------+  CFV       Full            Yes       Yes                             +---------+---------------+---------+-----------+----------+-------+  SFJ       Full                                                      +---------+---------------+---------+-----------+----------+-------+  FV Prox   Full                                                      +---------+---------------+---------+-----------+----------+-------+  FV Mid    Full                                                      +---------+---------------+---------+-----------+----------+-------+  FV Distal Full                                                      +---------+---------------+---------+-----------+----------+-------+  PFV  Full                                                      +---------+---------------+---------+-----------+----------+-------+  POP       Full            Yes       Yes                             +---------+---------------+---------+-----------+----------+-------+  PTV       Full                                                       +---------+---------------+---------+-----------+----------+-------+  PERO      Full                                                      +---------+---------------+---------+-----------+----------+-------+   +---------+---------------+---------+-----------+----------+-------+  LEFT      Compressibility Phasicity Spontaneity Properties Summary  +---------+---------------+---------+-----------+----------+-------+  CFV       Full            Yes       Yes                             +---------+---------------+---------+-----------+----------+-------+  SFJ       Full                                                      +---------+---------------+---------+-----------+----------+-------+  FV Prox   Full                                                      +---------+---------------+---------+-----------+----------+-------+  FV Mid    Full                                                      +---------+---------------+---------+-----------+----------+-------+  FV Distal Full                                                      +---------+---------------+---------+-----------+----------+-------+  PFV       Full                                                      +---------+---------------+---------+-----------+----------+-------+  POP       Full            Yes       Yes                             +---------+---------------+---------+-----------+----------+-------+  PTV       Full                                                      +---------+---------------+---------+-----------+----------+-------+  PERO      Full                                                      +---------+---------------+---------+-----------+----------+-------+     Summary: Right: There is no evidence of deep vein thrombosis in the lower extremity. No cystic structure found in the popliteal fossa. Left: There is no evidence of deep vein thrombosis in the lower extremity. No cystic structure found in the popliteal fossa.  *See  table(s) above for measurements and observations.    Preliminary         Scheduled Meds:  folic acid  1 mg Oral Daily   gabapentin  200 mg Oral TID   multivitamin with minerals  1 tablet Oral Daily   thiamine  100 mg Oral Daily   Continuous Infusions:  sodium chloride     heparin 1,400 Units/hr (01/10/19 1011)     LOS: 0 days    Time spent:35 mins. More than 50% of that time was spent in counseling and/or coordination of care.      Burnadette Pop, [name] [facility] Pager [phone]  If 7PM-7AM, please contact night-coverage [email or web address] Password [facility] 01/10/2019, 11:46 AM

## 2019-01-10 NOTE — Progress Notes (Signed)
Rhythm Ricotta [phone]) called to inquire about patient wanting to know everything that has been done and what the patient is here for. Stacy [name] informed that he is not on the contact list for information. He became very upset and stated that he needed the nurse to change the contact information on the patients chart. This Clinical research associate spoke with the patient and made the patient aware that her father was on the phone wanting to be informed about her condition and added to the contact list. The patient asked the nurse what did he want? This Clinical research associate explained that the father wanted to be updated and made aware of her condition and the patient shook her head back and forward to suggest the answer was no. The nurse further explained the fathers wishes and the patient became silent stating she wanted to get something for pain at this time. Nurse and patient made a verbal agreement that the focus was for pain right now and that the patient would think about it and discuss the status of updating her father at a later time. Father has been made aware that the nurse can not give information at this time.

## 2019-01-10 NOTE — Progress Notes (Signed)
  Echocardiogram 2D Echocardiogram has been performed.  Stacy [name] 01/10/2019, 10:21 AM

## 2019-01-10 NOTE — Progress Notes (Signed)
CRITICAL VALUE ALERT  Critical Value:  Lactic acid 2.3  Date & Time Notied:  [id number]  Provider Notified: yes  Orders Received/Actions taken: yes

## 2019-01-11 LAB — CBC WITH DIFFERENTIAL/PLATELET
Abs Immature Granulocytes: 0.02 10*3/uL (ref 0.00–0.07)
Basophils Absolute: 0 10*3/uL (ref 0.0–0.1)
Basophils Relative: 0 %
Eosinophils Absolute: 0.1 10*3/uL (ref 0.0–0.5)
Eosinophils Relative: 2 %
HCT: 26.1 % — ABNORMAL LOW (ref 36.0–46.0)
Hemoglobin: 8.5 g/dL — ABNORMAL LOW (ref 12.0–15.0)
Immature Granulocytes: 0 %
Lymphocytes Relative: 29 %
Lymphs Abs: 1.7 10*3/uL (ref 0.7–4.0)
MCH: 33.7 pg (ref 26.0–34.0)
MCHC: 32.6 g/dL (ref 30.0–36.0)
MCV: 103.6 fL — ABNORMAL HIGH (ref 80.0–100.0)
Monocytes Absolute: 0.3 10*3/uL (ref 0.1–1.0)
Monocytes Relative: 5 %
Neutro Abs: 3.7 10*3/uL (ref 1.7–7.7)
Neutrophils Relative %: 64 %
Platelets: 218 10*3/uL (ref 150–400)
RBC: 2.52 MIL/uL — ABNORMAL LOW (ref 3.87–5.11)
RDW: 17.8 % — ABNORMAL HIGH (ref 11.5–15.5)
WBC: 5.8 10*3/uL (ref 4.0–10.5)
nRBC: 0.3 % — ABNORMAL HIGH (ref 0.0–0.2)

## 2019-01-11 LAB — BASIC METABOLIC PANEL
Anion gap: 8 (ref 5–15)
BUN: 6 mg/dL (ref 6–20)
CO2: 24 mmol/L (ref 22–32)
Calcium: 8.4 mg/dL — ABNORMAL LOW (ref 8.9–10.3)
Chloride: 108 mmol/L (ref 98–111)
Creatinine, Ser: 0.56 mg/dL (ref 0.44–1.00)
GFR calc Af Amer: >60 mL/min (ref >60)
GFR calc non Af Amer: >60 mL/min (ref >60)
Glucose, Bld: 90 mg/dL (ref 70–99)
Potassium: 3.6 mmol/L (ref 3.5–5.1)
Sodium: 140 mmol/L (ref 135–145)

## 2019-01-11 LAB — LIPID PANEL
Cholesterol: 129 mg/dL (ref 0–200)
HDL: 56 mg/dL (ref >40)
LDL Cholesterol: 63 mg/dL (ref 0–99)
Total CHOL/HDL Ratio: 2.3 RATIO
Triglycerides: 48 mg/dL (ref <150)
VLDL: 10 mg/dL (ref 0–40)

## 2019-01-11 LAB — VITAMIN D 25 HYDROXY (VIT D DEFICIENCY, FRACTURES): Vit D, 25-Hydroxy: 14.7 ng/mL — ABNORMAL LOW (ref 30.0–100.0)

## 2019-01-11 LAB — HEPARIN LEVEL (UNFRACTIONATED): Heparin Unfractionated: 0.4 IU/mL (ref 0.30–0.70)

## 2019-01-11 MED ORDER — SODIUM CHLORIDE 0.9 % IV SOLN
INTRAVENOUS | Status: DC
  Administered 2019-01-11 – 2019-01-12 (×2): via INTRAVENOUS

## 2019-01-11 MED ORDER — GABAPENTIN 300 MG PO CAPS
300.0000 mg | ORAL_CAPSULE | Freq: Three times a day (TID) | ORAL | Status: DC
  Administered 2019-01-11 – 2019-01-15 (×11): 300 mg via ORAL
  Filled 2019-01-11 (×12): qty 1

## 2019-01-11 MED ORDER — MORPHINE SULFATE (PF) 2 MG/ML IV SOLN
2.0000 mg | INTRAVENOUS | Status: DC | PRN
  Administered 2019-01-11 – 2019-01-15 (×16): 2 mg via INTRAVENOUS
  Filled 2019-01-11 (×16): qty 1

## 2019-01-11 MED ORDER — WARFARIN - PHARMACIST DOSING INPATIENT
Freq: Every day | Status: DC
  Administered 2019-01-11 – 2019-01-13 (×3)

## 2019-01-11 MED ORDER — WARFARIN SODIUM 5 MG PO TABS
7.5000 mg | ORAL_TABLET | Freq: Once | ORAL | Status: AC
  Administered 2019-01-11: 7.5 mg via ORAL
  Filled 2019-01-11: qty 1

## 2019-01-11 MED ORDER — GABAPENTIN 400 MG PO CAPS: 400.0000 mg | ORAL_CAPSULE | Freq: Three times a day (TID) | ORAL | Status: DC

## 2019-01-11 NOTE — Progress Notes (Addendum)
ANTICOAGULATION CONSULT NOTE  Pharmacy Consult for Heparin, warfarin  Indication: pulmonary embolus  Allergies  Allergen Reactions  . Ibuprofen     Gastric bypass surgery and history of gi bleed.    Patient Measurements: Height: 5\' 2"  (157.5 cm) Weight: 191 lb 12.8 oz (87 kg) IBW/kg (Calculated) : 50.1 Heparin Dosing Weight: 70 kg  Vital Signs: Temp: 99.3 F (37.4 C) (05/25 0603) Temp Source: Oral (05/25 0603) BP: 135/101 (05/25 0603) Pulse Rate: 111 (05/25 0603)  Labs: Recent Labs    01/09/19 1512 01/09/19 1514 01/09/19 2000 01/10/19 0204 01/10/19 0813 01/10/19 1618 01/11/19 0542  HGB 11.5*  --   --  10.9*  --   --  8.5*  HCT 36.2  --   --  33.8*  --   --  26.1*  PLT 373  --   --  285  --   --  218  LABPROT  --   --  13.2  --   --   --   --   INR  --   --  1.0  --   --   --   --   HEPARINUNFRC  --   --   --   --  0.31 0.47 0.40  CREATININE  --  0.64  --   --   --   --  0.56  CKTOTAL  --   --  51  --   --   --   --   TROPONINI  --   --  <0.03 <0.03 <0.03  --   --     Estimated Creatinine Clearance: 97.7 mL/min (by C-G formula based on SCr of 0.56 mg/dL).   Medical History: Past Medical History:  Diagnosis Date  . Neuropathy 01/09/2019    Medications:  Infusions:  . sodium chloride 75 mL/hr at 01/10/19 1756  . heparin 1,400 Units/hr (01/10/19 1803)    Assessment: Patient with small segmental PE. Dopplers neg for DVT.  Baseline coags WNL.  Heparin per pharmacy for PE.   01/11/2019  Heparin level continues to be therapeutic on current IV heparin rate of 1400 units/hr    No bleeding reported  Hgb dropped to 8.5 from 10.9 - monitor closely  Plts dropped to 218 from 285 - monitor closely  To start warfarin today per guidelines - DOAC's not recommended s/p gastric bypass  Goal of Therapy:  Heparin level 0.3-0.7 units/ml Monitor platelets by anticoagulation protocol: Yes   Plan:  1) Continuue heparin drip at 1400 units/hr  2) Daily CBC and HL  while on heparin 3) Today will be overlap D1 of min 5 of warfarin/heparin 4) Warfarin 7.5mg  today at 1800 5) Will educate prior to discharge   Hessie Knows, PharmD, BCPS 01/11/2019 7:19 AM

## 2019-01-11 NOTE — Progress Notes (Addendum)
PROGRESS NOTE    Stacy KILLE  SLH:[id number] DOB: 1981-07-20 DOA: 01/09/2019 PCP: Cristy Folks, MD   Brief Narrative: Patient is a 38 year old female with history of gastric bypass surgery, obesity, chronic alcohol abuse who presents to the emergency department with complaints of bilateral foot pain.  She also reported heavy alcohol consumption.  See has history of chronic back pain.  Patient described bilateral foot pain as tingling and numbness from her thighs down to her feet  with no weakness.  Patient reported history of vitamin B12 deficiency.  Patient was noted to have hypertension, tachycardia, elevated liver enzymes on presentation.  D-dimer was elevated.  CT Angie of chest showed pulmonary embolism on the right lower lobe.  Started on heparin drip.Started on warfarin also.  Being monitored for alcohol withdrawal.  Assessment & Plan:   Active Problems:   Leukocytosis   Alcohol abuse   Elevated LFTs   Hepatic steatosis   Alcohol dependence with uncomplicated withdrawal (HCC)   Alcohol withdrawal (HCC)   Vitamin D deficiency   Folic acid deficiency   Non-sustained ventricular tachycardia (HCC)   Lactic acidosis   Pulmonary embolism (HCC)   Acute metabolic encephalopathy   Acute pulmonary embolism: CT angios showed right lower lobe pulmonary emboli.  No right ventricular strain.  Started on heparin drip.  Added warfarin today.  Bilateral lower extremity duplex did not show any DVT.  Could not start her on DOAC because of her history of gastric bypass surgery for which DOACs are not recommended.  Sinus tachycardia: Multifactorial.  Might be secondary to PE, could be associated  with alcohol withdrawal.  Her alcohol level was normal on presentation.  Echocardiogram showed normal left and right ventricular function, normal ejection fraction.  Continue gentle IV fluids.  Chronic alcohol abuse: Drinks daily.  Usually 3 or 4 glasses of wine.  Alcohol level was normal on  presentation.  Continue to monitor CIWA protocol.  Continue thiamine folic acid.  Patient is planning to go to alcohol rehabilitation and she has the resources.  Folic acid deficiency/macrocytic anemia: Continue folic acid supplementation.  Hemoglobin  dropped to 8.5 most likely secondary to hemodilution.  History of vitamin B12 deficiency: Vitamin B 12 level normal as per 12/22/2018.  Levels being repeated.  Also follow-up methylmalonic acid and homocystine.  Hepatic steatosis: Most likely associated with chronic alcohol abuse.  Presented with mild elevated liver enzymes.  Continue to monitor  Bilateral lower extremity pain: Complains of numbness, tingling of bilateral lower extremities.  Most likely neuropathy which could be associated with alcoholism, vitamin B 12 deficiency.  Check hemoglobin A1c level to rule out diabetic peripheral neuropathy.  Continue gabapentin.  EMG/nerve conduction test as an outpatient  Vitamin D deficiency: Continue supplementation  Lactic acidosis: Resolved  Encephalopathy: She was confused on presentation.  Currently alert and oriented.         DVT prophylaxis: Lovenox Code Status: Full Family Communication: None present at the bedside Disposition Plan: Home after INR becomes tehrapeutic   Consultants: None  Procedures: None  Antimicrobials:  Anti-infectives (From admission, onward)   None      Subjective: Patient seen and examined the bedside this morning.  Appears more comfortable today.  She is still on  sinus tachycardia, mildly hypertensive.  Complains of bilateral feet pain, tingling sensation.  Respiratory status stable  Objective: Vitals:   01/10/19 1831 01/10/19 2050 01/11/19 0003 01/11/19 0603  BP: (!) 128/99 (!) 134/103 (!) 134/92 (!) 135/101  Pulse: (!) 116 Marland Kitchen)  124 (!) 111 (!) 111  Resp:  Temp: 98.6 F (37 C) 98.6 F (37 C) 98.8 F (37.1 C) 99.3 F (37.4 C)  TempSrc:  Oral Oral Oral  SpO2: 100% 99% 98% 98%    Weight:      Height:       No intake or output data in the 24 hours ending 01/11/19 1111 Filed Weights   01/09/19 1459  Weight: 87 kg    Examination:  General exam: Appears calm and comfortable ,Not in distress,obese HEENT:PERRL,Oral mucosa moist, Ear/Nose normal on gross exam Respiratory system: Bilateral equal air entry, normal vesicular breath sounds, no wheezes or crackles  Cardiovascular system: Sinus tachycardia, RRR. No JVD, murmurs, rubs, gallops or clicks. Gastrointestinal system: Abdomen is nondistended, soft and nontender. No organomegaly or masses felt. Normal bowel sounds heard. Central nervous system: Alert and oriented. No focal neurological deficits. Extremities: No edema, no clubbing ,no cyanosis, distal peripheral pulses palpable. Skin: No rashes, lesions or ulcers,no icterus ,no pallor MSK: Normal muscle bulk,tone ,power Psychiatry: Judgement and insight appear normal. Mood & affect appropriate.     Data Reviewed: I have personally reviewed following labs and imaging studies  CBC: Recent Labs  Lab 01/05/19 2001 01/09/19 1512 01/10/19 0204 01/11/19 0542  WBC 7.4 12.2* 8.1 5.8  NEUTROABS  --  10.1*  --  3.7  HGB 11.4* 11.5* 10.9* 8.5*  HCT 34.5* 36.2 33.8* 26.1*  MCV 100.3* 102.8* 106.0* 103.6*  PLT 409* 373 285 218   Basic Metabolic Panel: Recent Labs  Lab 01/05/19 2001 01/09/19 1514 01/09/19 1602 01/09/19 2000 01/11/19 0542  NA 136 142  --   --  140  K 3.7 4.0  --   --  3.6  CL 102 107  --   --  108  CO2 21* 21*  --   --  24  GLUCOSE 93 72  --   --  90  BUN 6 5*  --   --  6  CREATININE 0.73 0.64  --   --  0.56  CALCIUM 9.5 9.1  --   --  8.4*  MG  --   --  2.0  --   --   PHOS  --   --   --  4.1  --    GFR: Estimated Creatinine Clearance: 97.7 mL/min (by C-G formula based on SCr of 0.56 mg/dL). Liver Function Tests: Recent Labs  Lab 01/05/19 2001 01/09/19 1514  AST 79* 99*  ALT 77* 85*  ALKPHOS 115 126  BILITOT 1.4* 1.2  PROT  7.3 8.0  ALBUMIN 3.5 4.1   Recent Labs  Lab 01/05/19 2001 01/09/19 2000  LIPASE 83* 53*   Recent Labs  Lab 01/10/19 0204  AMMONIA 35   Coagulation Profile: Recent Labs  Lab 01/09/19 2000  INR 1.0   Cardiac Enzymes: Recent Labs  Lab 01/09/19 2000 01/10/19 0204 01/10/19 0813  CKTOTAL 51  --   --   TROPONINI <0.03 <0.03 <0.03   BNP (last 3 results) No results for input(s): PROBNP in the last 8760 hours. HbA1C: No results for input(s): HGBA1C in the last 72 hours. CBG: Recent Labs  Lab 01/09/19 1521  GLUCAP 73   Lipid Profile: Recent Labs    01/11/19 0542  CHOL 129  HDL 56  LDLCALC 63  TRIG 48  CHOLHDL 2.3   Thyroid Function Tests: No results for input(s): TSH, T4TOTAL, FREET4, T3FREE, THYROIDAB in the last 72 hours. Anemia Panel: Recent  Labs    01/09/19 2000  VITAMINB12 248  FOLATE 2.1*   Sepsis Labs: Recent Labs  Lab 01/09/19 2000 01/10/19 0001 01/10/19 0204  LATICACIDVEN 3.1* 2.3* 1.3    Recent Results (from the past 240 hour(s))  SARS Coronavirus 2 (CEPHEID - Performed in [facility] hospital lab), Hosp Order     Status: None   Collection Time: 01/09/19  6:27 PM  Result Value Ref Range Status   SARS Coronavirus 2 NEGATIVE NEGATIVE Final    Comment: (NOTE) If result is NEGATIVE SARS-CoV-2 target nucleic acids are NOT DETECTED. The SARS-CoV-2 RNA is generally detectable in upper and lower  respiratory specimens during the acute phase of infection. The lowest  concentration of SARS-CoV-2 viral copies this assay can detect is 250  copies / mL. A negative result does not preclude SARS-CoV-2 infection  and should not be used as the sole basis for treatment or other  patient management decisions.  A negative result may occur with  improper specimen collection / handling, submission of specimen other  than nasopharyngeal swab, presence of viral mutation(s) within the  areas targeted by this assay, and inadequate number of viral copies  (<250  copies / mL). A negative result must be combined with clinical  observations, patient history, and epidemiological information. If result is POSITIVE SARS-CoV-2 target nucleic acids are DETECTED. The SARS-CoV-2 RNA is generally detectable in upper and lower  respiratory specimens dur ing the acute phase of infection.  Positive  results are indicative of active infection with SARS-CoV-2.  Clinical  correlation with patient history and other diagnostic information is  necessary to determine patient infection status.  Positive results do  not rule out bacterial infection or co-infection with other viruses. If result is PRESUMPTIVE POSTIVE SARS-CoV-2 nucleic acids MAY BE PRESENT.   A presumptive positive result was obtained on the submitted specimen  and confirmed on repeat testing.  While 2019 novel coronavirus  (SARS-CoV-2) nucleic acids may be present in the submitted sample  additional confirmatory testing may be necessary for epidemiological  and / or clinical management purposes  to differentiate between  SARS-CoV-2 and other Sarbecovirus currently known to infect humans.  If clinically indicated additional testing with an alternate test  methodology [phone]) is advised. The SARS-CoV-2 RNA is generally  detectable in upper and lower respiratory sp ecimens during the acute  phase of infection. The expected result is Negative. Fact Sheet for Patients:  BoilerBrush.com.cy Fact Sheet for Healthcare Providers: https://pope.com/ This test is not yet approved or cleared by the [place] FDA and has been authorized for detection and/or diagnosis of SARS-CoV-2 by FDA under an Emergency Use Authorization (EUA).  This EUA will remain in effect (meaning this test can be used) for the duration of the COVID-19 declaration under Section 564(b)(1) of the Act, 21 U.S.C. section 360bbb-3(b)(1), unless the authorization is terminated or revoked  sooner. Performed at [facility], [address], [place], [place] [address]          Radiology Studies: Dg Chest 2 View  Result Date: 01/09/2019 CLINICAL DATA:  Tachycardia EXAM: CHEST - 2 VIEW COMPARISON:  01/06/2011 FINDINGS: The heart size and mediastinal contours are within normal limits. Both lungs are clear. The visualized skeletal structures are unremarkable. IMPRESSION: No active cardiopulmonary disease. Electronically Signed   By: Alcide Clever M.D.   On: 01/09/2019 19:10   Ct Angio Chest Pe W Or Wo Contrast  Result Date: 01/09/2019 CLINICAL DATA:  Angina.  Dyspnea. EXAM:  CT ANGIOGRAPHY CHEST WITH CONTRAST TECHNIQUE: Multidetector CT imaging of the chest was performed using the standard protocol during bolus administration of intravenous contrast. Multiplanar CT image reconstructions and MIPs were obtained to evaluate the vascular anatomy. CONTRAST:  100mL OMNIPAQUE IOHEXOL 350 MG/ML SOLN COMPARISON:  No recent relevant prior available for comparison. Correlation made with chest x-ray from the same day. FINDINGS: Cardiovascular: There is an acute segmental pulmonary embolus involving the right medial basilar segment of the right lower lobe. There are additional subsegmental pulmonary emboli involving the right lower lobe. There is no CT evidence of right heart strain. There is no pericardial effusion. No aortic dissection. The main pulmonary artery is dilated measuring 3.1 cm. Mediastinum/Nodes: No enlarged mediastinal, hilar, or axillary lymph nodes. Thyroid gland, trachea, and esophagus demonstrate no significant findings. Lungs/Pleura: Lungs are clear. No pleural effusion or pneumothorax. Upper Abdomen: There is severe hepatic steatosis. The patient appears to be status post prior gastric bypass. Musculoskeletal: No chest wall abnormality. No acute or significant osseous findings. Review of the MIP images confirms the above findings. IMPRESSION: 1. Acute segmental and  subsegmental pulmonary emboli primarily involving the right lower lobe as detailed above. The overall burden is low. There is no CT evidence of right heart strain. The main pulmonary artery is dilated which can be seen in patients with elevated PA pressures. 2. Severe hepatic steatosis. 3. The lungs are clear. These results were called by telephone at the time of interpretation on 01/09/2019 at 10:49 pm to RN Jean RosenthalJackson, who verbally acknowledged these results. Electronically Signed   By: Katherine Mantlehristopher  Green M.D.   On: 01/09/2019 22:51   Vas [place] Lower Extremity Venous (dvt)  Result Date: 01/11/2019  Lower Venous Study Indications: Edema.  Performing Technologist: Chanda BusingGregory Collins RVT  Examination Guidelines: A complete evaluation includes B-mode imaging, spectral Doppler, color Doppler, and power Doppler as needed of all accessible portions of each vessel. Bilateral testing is considered an integral part of a complete examination. Limited examinations for reoccurring indications may be performed as noted.  +---------+---------------+---------+-----------+----------+-------+  RIGHT     Compressibility Phasicity Spontaneity Properties Summary  +---------+---------------+---------+-----------+----------+-------+  CFV       Full            Yes       Yes                             +---------+---------------+---------+-----------+----------+-------+  SFJ       Full                                                      +---------+---------------+---------+-----------+----------+-------+  FV Prox   Full                                                      +---------+---------------+---------+-----------+----------+-------+  FV Mid    Full                                                      +---------+---------------+---------+-----------+----------+-------+  FV Distal Full                                                      +---------+---------------+---------+-----------+----------+-------+  PFV       Full                                                       +---------+---------------+---------+-----------+----------+-------+  POP       Full            Yes       Yes                             +---------+---------------+---------+-----------+----------+-------+  PTV       Full                                                      +---------+---------------+---------+-----------+----------+-------+  PERO      Full                                                      +---------+---------------+---------+-----------+----------+-------+   +---------+---------------+---------+-----------+----------+-------+  LEFT      Compressibility Phasicity Spontaneity Properties Summary  +---------+---------------+---------+-----------+----------+-------+  CFV       Full            Yes       Yes                             +---------+---------------+---------+-----------+----------+-------+  SFJ       Full                                                      +---------+---------------+---------+-----------+----------+-------+  FV Prox   Full                                                      +---------+---------------+---------+-----------+----------+-------+  FV Mid    Full                                                      +---------+---------------+---------+-----------+----------+-------+  FV Distal Full                                                      +---------+---------------+---------+-----------+----------+-------+  PFV       Full                                                      +---------+---------------+---------+-----------+----------+-------+  POP       Full            Yes       Yes                             +---------+---------------+---------+-----------+----------+-------+  PTV       Full                                                      +---------+---------------+---------+-----------+----------+-------+  PERO      Full                                                       +---------+---------------+---------+-----------+----------+-------+     Summary: Right: There is no evidence of deep vein thrombosis in the lower extremity. No cystic structure found in the popliteal fossa. Left: There is no evidence of deep vein thrombosis in the lower extremity. No cystic structure found in the popliteal fossa.  *See table(s) above for measurements and observations. Electronically signed by Waverly Ferrari MD on 01/11/2019 at 6:44:33 AM.    Final         Scheduled Meds:  folic acid  1 mg Oral Daily   gabapentin  200 mg Oral TID   multivitamin with minerals  1 tablet Oral Daily   thiamine  100 mg Oral Daily   Vitamin D (Ergocalciferol)  50,000 Units Oral Q7 days   warfarin  7.5 mg Oral ONCE-1800   Warfarin - Pharmacist Dosing Inpatient   Does not apply q1800   Continuous Infusions:  heparin 1,400 Units/hr (01/10/19 1803)     LOS: 1 day    Time spent:35 mins. More than 50% of that time was spent in counseling and/or coordination of care.      Burnadette Pop, MD [facility] Pager [phone]  If 7PM-7AM, please contact night-coverage [email or web address] Password [email or web address] 01/11/2019, 11:11 AM

## 2019-01-12 ENCOUNTER — Inpatient Hospital Stay (HOSPITAL_COMMUNITY): Payer: Medicaid Other

## 2019-01-12 ENCOUNTER — Encounter (HOSPITAL_COMMUNITY): Payer: Medicaid Other

## 2019-01-12 DIAGNOSIS — R52 Pain, unspecified: Secondary | ICD-10-CM

## 2019-01-12 LAB — HOMOCYSTEINE: Homocysteine: 29.8 umol/L — ABNORMAL HIGH (ref 0.0–14.5)

## 2019-01-12 LAB — PROTIME-INR
INR: 0.9 (ref 0.8–1.2)
Prothrombin Time: 12.5 seconds (ref 11.4–15.2)

## 2019-01-12 LAB — HEPARIN LEVEL (UNFRACTIONATED): Heparin Unfractionated: 0.45 IU/mL (ref 0.30–0.70)

## 2019-01-12 LAB — HEPATITIS PANEL, ACUTE
HCV Ab: <0.1 s/co ratio (ref 0.0–0.9)
Hep A IgM: NEGATIVE
Hep B C IgM: NEGATIVE
Hepatitis B Surface Ag: NEGATIVE

## 2019-01-12 MED ORDER — WARFARIN SODIUM 5 MG PO TABS
7.5000 mg | ORAL_TABLET | Freq: Once | ORAL | Status: AC
  Administered 2019-01-12: 7.5 mg via ORAL
  Filled 2019-01-12: qty 1

## 2019-01-12 MED ORDER — WARFARIN VIDEO
Freq: Once | Status: AC
  Administered 2019-01-12: 1

## 2019-01-12 MED ORDER — HYDROCHLOROTHIAZIDE 12.5 MG PO CAPS: 12.5000 mg | ORAL_CAPSULE | Freq: Every day | ORAL | Status: DC

## 2019-01-12 MED ORDER — COUMADIN BOOK
Freq: Once | Status: AC
  Administered 2019-01-12: 12:00:00
  Filled 2019-01-12: qty 1

## 2019-01-12 MED ORDER — AMLODIPINE BESYLATE 10 MG PO TABS
10.0000 mg | ORAL_TABLET | Freq: Every day | ORAL | Status: DC
  Administered 2019-01-12 – 2019-01-15 (×4): 10 mg via ORAL
  Filled 2019-01-12 (×4): qty 1

## 2019-01-12 NOTE — Progress Notes (Signed)
ANTICOAGULATION CONSULT NOTE  Pharmacy Consult for Heparin, warfarin - D2 overlap Indication: pulmonary embolus  Allergies  Allergen Reactions  . Ibuprofen     Gastric bypass surgery and history of gi bleed.    Patient Measurements: Height: 5\' 2"  (157.5 cm) Weight: 191 lb 12.8 oz (87 kg) IBW/kg (Calculated) : 50.1 Heparin Dosing Weight: 70 kg  Vital Signs: Temp: 98.1 F (36.7 C) (05/26 0535) Temp Source: Oral (05/26 0535) BP: 135/91 (05/26 2993) Pulse Rate: 101 (05/26 0614)  Labs: Recent Labs    01/09/19 1512 01/09/19 1514 01/09/19 2000 01/10/19 0204  01/10/19 0813 01/10/19 1618 01/11/19 0542 01/12/19 0525  HGB 11.5*  --   --  10.9*  --   --   --  8.5*  --   HCT 36.2  --   --  33.8*  --   --   --  26.1*  --   PLT 373  --   --  285  --   --   --  218  --   LABPROT  --   --  13.2  --   --   --   --   --  12.5  INR  --   --  1.0  --   --   --   --   --  0.9  HEPARINUNFRC  --   --   --   --    < > 0.31 0.47 0.40 0.45  CREATININE  --  0.64  --   --   --   --   --  0.56  --   CKTOTAL  --   --  51  --   --   --   --   --   --   TROPONINI  --   --  <0.03 <0.03  --  <0.03  --   --   --    < > = values in this interval not displayed.    Estimated Creatinine Clearance: 97.7 mL/min (by C-G formula based on SCr of 0.56 mg/dL).   Medical History: Past Medical History:  Diagnosis Date  . Neuropathy 01/09/2019    Medications:  Infusions:  . sodium chloride 75 mL/hr at 01/11/19 1700  . heparin 1,400 Units/hr (01/10/19 1803)    Assessment: Patient with small segmental PE. Dopplers neg for DVT.  Baseline coags WNL.  Heparin per pharmacy for PE.   01/12/2019  Heparin level continues to be therapeutic on current IV heparin rate of 1400 units/hr    No bleeding reported  Hgb dropped to 8.5 from 10.9 - monitor closely  Plts dropped to 218 from 285 - monitor closely  INR subtherapeutic as expected s/p 1st warfarin dose of 7.5mg  5/25  Goal of Therapy:  Heparin level  0.3-0.7 units/ml Monitor platelets by anticoagulation protocol: Yes   Plan:  1) Continue heparin drip at 1400 units/hr  2) Daily CBC and HL while on heparin 3) Today will be overlap D2 of min 5 of warfarin/heparin then continue overlap until INR therapeutic x 2 days 4) Repeat warfarin 7.5mg  today at 1800 5) Will educate prior to discharge 6) Note that DOAC's not recommended s/p gastric bypass   Hessie Knows, PharmD, BCPS 01/12/2019 7:18 AM

## 2019-01-12 NOTE — Progress Notes (Signed)
PROGRESS NOTE    Burman FosterLititia J Wrage  JXB:[id number] DOB: 1980-09-06 DOA: 01/09/2019 PCP: Cristy FolksFleming, Gregory, MD   Brief Narrative: Patient is a 38 year old female with history of gastric bypass surgery, obesity, chronic alcohol abuse who presents to the emergency department with complaints of bilateral foot pain.  She also reported heavy alcohol consumption.   Patient described bilateral foot pain as tingling and numbness from her thighs down to her feet  with no weakness.  Patient reported history of vitamin B12 deficiency.  Patient was noted to have hypertension, tachycardia, elevated liver enzymes on presentation.  D-dimer was elevated.  CT Angio of chest showed pulmonary embolism on the right lower lobe.  Started on heparin drip.Started on warfarin also.  Being monitored for alcohol withdrawal. Discharge planning after INR becomes therapeutic (2-3).  Assessment & Plan:   Active Problems:   Leukocytosis   Alcohol abuse   Elevated LFTs   Hepatic steatosis   Alcohol dependence with uncomplicated withdrawal (HCC)   Alcohol withdrawal (HCC)   Vitamin D deficiency   Folic acid deficiency   Non-sustained ventricular tachycardia (HCC)   Lactic acidosis   Pulmonary embolism (HCC)   Acute metabolic encephalopathy   Acute pulmonary embolism: CT angios showed right lower lobe pulmonary emboli.  No right ventricular strain.  Started on heparin drip.  Added warfarin .  Bilateral lower extremity duplex did not show any DVT.  Could not start her on DOAC because of her history of gastric bypass surgery for which DOACs are not recommended.  She will continue warfarin and heparin until INR reaches 2-3.  She will be discharged on Coumadin.  She needs to follow-up INR as an outpatient.  Sinus tachycardia: Multifactorial.  Might be secondary to PE, could be associated  with alcohol withdrawal.  Her alcohol level was normal on presentation.  Echocardiogram showed normal left and right ventricular function,  normal ejection fraction. We will discontinue IV fluids.  Hypertension: Blood pressure consistently high during this hospitalization.  Started on amlodipine .  Chronic alcohol abuse: Drinks daily.  Usually 3 or 4 glasses of wine.  Alcohol level was normal on presentation.  Continue to monitor CIWA protocol.  Continue thiamine, folic acid.  Patient was planning to go to alcohol rehabilitation .  Social worker consulted for providing alcohol rehabilitation services.  Folic acid deficiency/macrocytic anemia: Continue folic acid supplementation.  Hemoglobin  dropped to 8.5 most likely secondary to hemodilution.  History of vitamin B12 deficiency: Vitamin B 12 level normal . Methylmalonic acid and homocystine,pending  Hepatic steatosis: Most likely associated with chronic alcohol abuse.  Presented with mild elevated liver enzymes.  Continue to monitor  Bilateral lower extremity pain: Complains of numbness, tingling of bilateral lower extremities.  Most likely neuropathy which could be associated with alcoholism.  Check hemoglobin A1c level to rule out diabetic peripheral neuropathy.  Continue gabapentin.  Venous duplex was negative for DVT.  Bilateral legs are warm, nontender to touch.  Sensation normal.  Pulse was not easily palpable on bilateral lower extremities so I ordered ABI/arterial duplex to rule out any peripheral vascular disease.  Vitamin D deficiency: Continue supplementation  Lactic acidosis: Resolved  Encephalopathy: She was confused on presentation.  Currently alert and oriented.         DVT prophylaxis:heparin and coumadin  Code Status: Full Family Communication: Discussed with father on phone  disposition Plan: Home after INR becomes therapeutic.Waiting for pT evaluation.   Consultants: None  Procedures: None  Antimicrobials:  Anti-infectives (From admission, onward)  None      Subjective: Patient seen and examined the bedside this morning.  Mildly hypertensive  and tachycardic.  Continues to complain of bilateral lower extremity pain from her knees to toes.  Objective: Vitals:   01/11/19 2102 01/11/19 2257 01/12/19 0535 01/12/19 0614  BP: (!) 141/110 (!) 133/100 (!) 146/108 (!) 135/91  Pulse: (!) 120 (!) 102 (!) 121 (!) 101  Resp: 20  16   Temp: 98.4 F (36.9 C)  98.1 F (36.7 C)   TempSrc: Oral  Oral   SpO2: 99%  98%   Weight:      Height:        Intake/Output Summary (Last 24 hours) at 01/12/2019 1225 Last data filed at 01/12/2019 0802 Gross per 24 hour  Intake 2526.28 ml  Output -  Net 2526.28 ml   Filed Weights   01/09/19 1459  Weight: 87 kg    Examination:   General exam: Not in distress,obese HEENT:PERRL,Oral mucosa moist, Ear/Nose normal on gross exam Respiratory system: Bilateral equal air entry, normal vesicular breath sounds, no wheezes or crackles  Cardiovascular system: Sinus tachycardia. No JVD, murmurs, rubs, gallops or clicks. Gastrointestinal system: Abdomen is nondistended, soft and nontender. No organomegaly or masses felt. Normal bowel sounds heard. Central nervous system: Alert and oriented. No focal neurological deficits. Extremities: No edema, no clubbing ,no cyanosis  Skin: No rashes, lesions or ulcers,no icterus ,no pallor   Data Reviewed: I have personally reviewed following labs and imaging studies  CBC: Recent Labs  Lab 01/05/19 2001 01/09/19 1512 01/10/19 0204 01/11/19 0542  WBC 7.4 12.2* 8.1 5.8  NEUTROABS  --  10.1*  --  3.7  HGB 11.4* 11.5* 10.9* 8.5*  HCT 34.5* 36.2 33.8* 26.1*  MCV 100.3* 102.8* 106.0* 103.6*  PLT 409* 373 285 218   Basic Metabolic Panel: Recent Labs  Lab 01/05/19 2001 01/09/19 1514 01/09/19 1602 01/09/19 2000 01/11/19 0542  NA 136 142  --   --  140  K 3.7 4.0  --   --  3.6  CL 102 107  --   --  108  CO2 21* 21*  --   --  24  GLUCOSE 93 72  --   --  90  BUN 6 5*  --   --  6  CREATININE 0.73 0.64  --   --  0.56  CALCIUM 9.5 9.1  --   --  8.4*  MG  --   --   2.0  --   --   PHOS  --   --   --  4.1  --    GFR: Estimated Creatinine Clearance: 97.7 mL/min (by C-G formula based on SCr of 0.56 mg/dL). Liver Function Tests: Recent Labs  Lab 01/05/19 2001 01/09/19 1514  AST 79* 99*  ALT 77* 85*  ALKPHOS 115 126  BILITOT 1.4* 1.2  PROT 7.3 8.0  ALBUMIN 3.5 4.1   Recent Labs  Lab 01/05/19 2001 01/09/19 2000  LIPASE 83* 53*   Recent Labs  Lab 01/10/19 0204  AMMONIA 35   Coagulation Profile: Recent Labs  Lab 01/09/19 2000 01/12/19 0525  INR 1.0 0.9   Cardiac Enzymes: Recent Labs  Lab 01/09/19 2000 01/10/19 0204 01/10/19 0813  CKTOTAL 51  --   --   TROPONINI <0.03 <0.03 <0.03   BNP (last 3 results) No results for input(s): PROBNP in the last 8760 hours. HbA1C: No results for input(s): HGBA1C in the last 72 hours. CBG: Recent Labs  Lab 01/09/19 1521  GLUCAP 73   Lipid Profile: Recent Labs    01/11/19 0542  CHOL 129  HDL 56  LDLCALC 63  TRIG 48  CHOLHDL 2.3   Thyroid Function Tests: No results for input(s): TSH, T4TOTAL, FREET4, T3FREE, THYROIDAB in the last 72 hours. Anemia Panel: Recent Labs    01/09/19 2000  VITAMINB12 248  FOLATE 2.1*   Sepsis Labs: Recent Labs  Lab 01/09/19 2000 01/10/19 0001 01/10/19 0204  LATICACIDVEN 3.1* 2.3* 1.3    Recent Results (from the past 240 hour(s))  SARS Coronavirus 2 (CEPHEID - Performed in [facility] hospital lab), Hosp Order     Status: None   Collection Time: 01/09/19  6:27 PM  Result Value Ref Range Status   SARS Coronavirus 2 NEGATIVE NEGATIVE Final    Comment: (NOTE) If result is NEGATIVE SARS-CoV-2 target nucleic acids are NOT DETECTED. The SARS-CoV-2 RNA is generally detectable in upper and lower  respiratory specimens during the acute phase of infection. The lowest  concentration of SARS-CoV-2 viral copies this assay can detect is 250  copies / mL. A negative result does not preclude SARS-CoV-2 infection  and should not be used as the sole basis  for treatment or other  patient management decisions.  A negative result may occur with  improper specimen collection / handling, submission of specimen other  than nasopharyngeal swab, presence of viral mutation(s) within the  areas targeted by this assay, and inadequate number of viral copies  (<250 copies / mL). A negative result must be combined with clinical  observations, patient history, and epidemiological information. If result is POSITIVE SARS-CoV-2 target nucleic acids are DETECTED. The SARS-CoV-2 RNA is generally detectable in upper and lower  respiratory specimens dur ing the acute phase of infection.  Positive  results are indicative of active infection with SARS-CoV-2.  Clinical  correlation with patient history and other diagnostic information is  necessary to determine patient infection status.  Positive results do  not rule out bacterial infection or co-infection with other viruses. If result is PRESUMPTIVE POSTIVE SARS-CoV-2 nucleic acids MAY BE PRESENT.   A presumptive positive result was obtained on the submitted specimen  and confirmed on repeat testing.  While 2019 novel coronavirus  (SARS-CoV-2) nucleic acids may be present in the submitted sample  additional confirmatory testing may be necessary for epidemiological  and / or clinical management purposes  to differentiate between  SARS-CoV-2 and other Sarbecovirus currently known to infect humans.  If clinically indicated additional testing with an alternate test  methodology [phone]) is advised. The SARS-CoV-2 RNA is generally  detectable in upper and lower respiratory sp ecimens during the acute  phase of infection. The expected result is Negative. Fact Sheet for Patients:  BoilerBrush.com.cy Fact Sheet for Healthcare Providers: https://pope.com/ This test is not yet approved or cleared by the [place] FDA and has been authorized for detection and/or  diagnosis of SARS-CoV-2 by FDA under an Emergency Use Authorization (EUA).  This EUA will remain in effect (meaning this test can be used) for the duration of the COVID-19 declaration under Section 564(b)(1) of the Act, 21 U.S.C. section 360bbb-3(b)(1), unless the authorization is terminated or revoked sooner. Performed at [facility], [address] [place], [place] [address]          Radiology Studies: No results found.      Scheduled Meds: . folic acid  1 mg Oral Daily  . gabapentin  300 mg Oral TID  .  multivitamin with minerals  1 tablet Oral Daily  . thiamine  100 mg Oral Daily  . Vitamin D (Ergocalciferol)  50,000 Units Oral Q7 days  . warfarin  7.5 mg Oral ONCE-1800  . Warfarin - Pharmacist Dosing Inpatient   Does not apply q1800   Continuous Infusions: . sodium chloride 75 mL/hr at 01/12/19 0802  . heparin 1,400 Units/hr (01/12/19 1156)     LOS: 2 days    Time spent:35 mins. More than 50% of that time was spent in counseling and/or coordination of care.      Burnadette Pop, MD [facility] Pager [phone]  If 7PM-7AM, please contact night-coverage [email or web address] Password [facility] 01/12/2019, 12:25 PM

## 2019-01-12 NOTE — Progress Notes (Signed)
Patient's sister Gunnar Bulla called and wanted to be on patient's primary contact list. Patient verbally agreed. Her phone is [phone]

## 2019-01-12 NOTE — Progress Notes (Signed)
ABI has been completed.   Preliminary results in CV Proc.   Blanch Media 01/12/2019 2:00 PM

## 2019-01-13 DIAGNOSIS — G9341 Metabolic encephalopathy: Secondary | ICD-10-CM

## 2019-01-13 LAB — CBC WITH DIFFERENTIAL/PLATELET
Abs Immature Granulocytes: 0.06 10*3/uL (ref 0.00–0.07)
Basophils Absolute: 0 10*3/uL (ref 0.0–0.1)
Basophils Relative: 0 %
Eosinophils Absolute: 0.2 10*3/uL (ref 0.0–0.5)
Eosinophils Relative: 3 %
HCT: 29.4 % — ABNORMAL LOW (ref 36.0–46.0)
Hemoglobin: 9.5 g/dL — ABNORMAL LOW (ref 12.0–15.0)
Immature Granulocytes: 1 %
Lymphocytes Relative: 19 %
Lymphs Abs: 1.3 10*3/uL (ref 0.7–4.0)
MCH: 33.9 pg (ref 26.0–34.0)
MCHC: 32.3 g/dL (ref 30.0–36.0)
MCV: 105 fL — ABNORMAL HIGH (ref 80.0–100.0)
Monocytes Absolute: 0.4 10*3/uL (ref 0.1–1.0)
Monocytes Relative: 7 %
Neutro Abs: 4.7 10*3/uL (ref 1.7–7.7)
Neutrophils Relative %: 70 %
Platelets: 226 10*3/uL (ref 150–400)
RBC: 2.8 MIL/uL — ABNORMAL LOW (ref 3.87–5.11)
RDW: 18.1 % — ABNORMAL HIGH (ref 11.5–15.5)
WBC: 6.7 10*3/uL (ref 4.0–10.5)
nRBC: 0.4 % — ABNORMAL HIGH (ref 0.0–0.2)

## 2019-01-13 LAB — HEPARIN LEVEL (UNFRACTIONATED): Heparin Unfractionated: 0.29 IU/mL — ABNORMAL LOW (ref 0.30–0.70)

## 2019-01-13 LAB — BASIC METABOLIC PANEL
Anion gap: 7 (ref 5–15)
BUN: <5 mg/dL — ABNORMAL LOW (ref 6–20)
CO2: 25 mmol/L (ref 22–32)
Calcium: 9 mg/dL (ref 8.9–10.3)
Chloride: 108 mmol/L (ref 98–111)
Creatinine, Ser: 0.52 mg/dL (ref 0.44–1.00)
GFR calc Af Amer: >60 mL/min (ref >60)
GFR calc non Af Amer: >60 mL/min (ref >60)
Glucose, Bld: 100 mg/dL — ABNORMAL HIGH (ref 70–99)
Potassium: 3.6 mmol/L (ref 3.5–5.1)
Sodium: 140 mmol/L (ref 135–145)

## 2019-01-13 LAB — PROTIME-INR
INR: 1 (ref 0.8–1.2)
Prothrombin Time: 13 seconds (ref 11.4–15.2)

## 2019-01-13 LAB — HEMOGLOBIN A1C
Hgb A1c MFr Bld: 4.5 % — ABNORMAL LOW (ref 4.8–5.6)
Mean Plasma Glucose: 82.45 mg/dL

## 2019-01-13 LAB — METHYLMALONIC ACID, SERUM: Methylmalonic Acid, Quantitative: 138 nmol/L (ref 0–378)

## 2019-01-13 MED ORDER — HEPARIN (PORCINE) 25000 UT/250ML-% IV SOLN
1500.0000 [IU]/h | INTRAVENOUS | Status: AC
  Administered 2019-01-14: 1500 [IU]/h via INTRAVENOUS
  Filled 2019-01-13 (×3): qty 250

## 2019-01-13 MED ORDER — ZOLPIDEM TARTRATE 5 MG PO TABS
5.0000 mg | ORAL_TABLET | Freq: Every evening | ORAL | Status: DC | PRN
  Administered 2019-01-13 – 2019-01-14 (×2): 5 mg via ORAL
  Filled 2019-01-13 (×2): qty 1

## 2019-01-13 MED ORDER — METOPROLOL TARTRATE 12.5 MG HALF TABLET
12.5000 mg | ORAL_TABLET | Freq: Two times a day (BID) | ORAL | Status: DC
  Administered 2019-01-13 – 2019-01-15 (×5): 12.5 mg via ORAL
  Filled 2019-01-13 (×5): qty 1

## 2019-01-13 MED ORDER — WARFARIN SODIUM 5 MG PO TABS
10.0000 mg | ORAL_TABLET | Freq: Once | ORAL | Status: AC
  Administered 2019-01-13: 17:00:00 10 mg via ORAL
  Filled 2019-01-13: qty 2

## 2019-01-13 MED ORDER — ONDANSETRON HCL 4 MG/2ML IJ SOLN
4.0000 mg | Freq: Four times a day (QID) | INTRAMUSCULAR | Status: DC | PRN
  Administered 2019-01-13: 09:00:00 4 mg via INTRAVENOUS
  Filled 2019-01-13: qty 2

## 2019-01-13 NOTE — TOC Transition Note (Signed)
Transition of Care [facility]) - CM/SW Discharge Note   Patient Details  Name: Stacy [name] MRN: [id number] Date of Birth: 15-May-1981  Transition of Care [facility]) CM/SW Contact:  Coralyn Helling, LCSW Phone Number: 01/13/2019, 9:57 AM   Clinical Narrative:   LCSW consulted for SA. Patient was seen on 5/24 by [facility] dept. Patient was given resources and reported having treatment arranged in [place]. TOC dept is signing off at this time. Patient has resources and treatment arranged.       Barriers to Discharge: Continued Medical Work up   Patient Goals and CMS Choice        Discharge Placement                       Discharge Plan and Services                                     Social Determinants of Health (SDOH) Interventions     Readmission Risk Interventions No flowsheet data found.

## 2019-01-13 NOTE — Progress Notes (Signed)
PROGRESS NOTE    TATA REBAR  GYK:[id number] DOB: 1980/09/22 DOA: 01/09/2019 PCP: Cristy Folks, MD   Brief Narrative:  Patient is a 38 year old female with history of gastric bypass surgery, obesity, chronic alcohol abuse who presents to the emergency department with complaints of bilateral foot pain.  She also reported heavy alcohol consumption.   Patient described bilateral foot pain as tingling and numbness from her thighs down to her feet  with no weakness.  Patient reported history of vitamin B12 deficiency.  Patient was noted to have hypertension, tachycardia, elevated liver enzymes on presentation.  D-dimer was elevated.  CT Angio of chest showed pulmonary embolism on the right lower lobe.  Started on heparin drip.Started on warfarin also.  Being monitored for alcohol withdrawal. Discharge planning after INR becomes therapeutic (2-3).  Consultants:   None  Procedures:   None  Antimicrobials:   None   Subjective: Patient seen and examined.  She continues to complain of burning pain in bilateral lower extremities.  No other complaint.  Denies shortness of breath or chest pain.  Objective: Vitals:   01/12/19 1730 01/12/19 2237 01/13/19 0500 01/13/19 0502  BP: 140/90 (!) 144/97 (!) 141/101 (!) 129/97  Pulse: 98 (!) 118 (!) 116 (!) 117  Resp: 18 20 20 20   Temp: 98.2 F (36.8 C) 98.1 F (36.7 C) 98.4 F (36.9 C)   TempSrc: Oral Oral Oral   SpO2: 98% 96% 100% 99%  Weight:      Height:        Intake/Output Summary (Last 24 hours) at 01/13/2019 0944 Last data filed at 01/13/2019 0600 Gross per 24 hour  Intake 1544.83 ml  Output -  Net 1544.83 ml   Filed Weights   01/09/19 1459  Weight: 87 kg    Examination:  General exam: Appears calm and comfortable  Respiratory system: Clear to auscultation. Respiratory effort normal. Cardiovascular system: S1 & S2 heard, RRR. No JVD, murmurs, rubs, gallops or clicks. No pedal edema. Gastrointestinal system:  Abdomen is nondistended, soft and nontender. No organomegaly or masses felt. Normal bowel sounds heard. Central nervous system: Alert and oriented. No focal neurological deficits. Extremities: Symmetric 5 x 5 power. Skin: No rashes, lesions or ulcers Psychiatry: Judgement and insight appear normal. Mood & affect appropriate.    Data Reviewed: I have personally reviewed following labs and imaging studies  CBC: Recent Labs  Lab 01/09/19 1512 01/10/19 0204 01/11/19 0542 01/13/19 0624  WBC 12.2* 8.1 5.8 6.7  NEUTROABS 10.1*  --  3.7 4.7  HGB 11.5* 10.9* 8.5* 9.5*  HCT 36.2 33.8* 26.1* 29.4*  MCV 102.8* 106.0* 103.6* 105.0*  PLT 373 285 218 226   Basic Metabolic Panel: Recent Labs  Lab 01/09/19 1514 01/09/19 1602 01/09/19 2000 01/11/19 0542 01/13/19 0624  NA 142  --   --  140 140  K 4.0  --   --  3.6 3.6  CL 107  --   --  108 108  CO2 21*  --   --  24 25  GLUCOSE 72  --   --  90 100*  BUN 5*  --   --  6 <5*  CREATININE 0.64  --   --  0.56 0.52  CALCIUM 9.1  --   --  8.4* 9.0  MG  --  2.0  --   --   --   PHOS  --   --  4.1  --   --    GFR: Estimated Creatinine Clearance:  97.7 mL/min (by C-G formula based on SCr of 0.52 mg/dL). Liver Function Tests: Recent Labs  Lab 01/09/19 1514  AST 99*  ALT 85*  ALKPHOS 126  BILITOT 1.2  PROT 8.0  ALBUMIN 4.1   Recent Labs  Lab 01/09/19 2000  LIPASE 53*   Recent Labs  Lab 01/10/19 0204  AMMONIA 35   Coagulation Profile: Recent Labs  Lab 01/09/19 2000 01/12/19 0525 01/13/19 0624  INR 1.0 0.9 1.0   Cardiac Enzymes: Recent Labs  Lab 01/09/19 2000 01/10/19 0204 01/10/19 0813  CKTOTAL 51  --   --   TROPONINI <0.03 <0.03 <0.03   BNP (last 3 results) No results for input(s): PROBNP in the last 8760 hours. HbA1C: Recent Labs    01/13/19 0624  HGBA1C 4.5*   CBG: Recent Labs  Lab 01/09/19 1521  GLUCAP 73   Lipid Profile: Recent Labs    01/11/19 0542  CHOL 129  HDL 56  LDLCALC 63  TRIG 48   CHOLHDL 2.3   Thyroid Function Tests: No results for input(s): TSH, T4TOTAL, FREET4, T3FREE, THYROIDAB in the last 72 hours. Anemia Panel: No results for input(s): VITAMINB12, FOLATE, FERRITIN, TIBC, IRON, RETICCTPCT in the last 72 hours. Sepsis Labs: Recent Labs  Lab 01/09/19 2000 01/10/19 0001 01/10/19 0204  LATICACIDVEN 3.1* 2.3* 1.3    Recent Results (from the past 240 hour(s))  SARS Coronavirus 2 (CEPHEID - Performed in [facility] hospital lab), Hosp Order     Status: None   Collection Time: 01/09/19  6:27 PM  Result Value Ref Range Status   SARS Coronavirus 2 NEGATIVE NEGATIVE Final    Comment: (NOTE) If result is NEGATIVE SARS-CoV-2 target nucleic acids are NOT DETECTED. The SARS-CoV-2 RNA is generally detectable in upper and lower  respiratory specimens during the acute phase of infection. The lowest  concentration of SARS-CoV-2 viral copies this assay can detect is 250  copies / mL. A negative result does not preclude SARS-CoV-2 infection  and should not be used as the sole basis for treatment or other  patient management decisions.  A negative result may occur with  improper specimen collection / handling, submission of specimen other  than nasopharyngeal swab, presence of viral mutation(s) within the  areas targeted by this assay, and inadequate number of viral copies  (<250 copies / mL). A negative result must be combined with clinical  observations, patient history, and epidemiological information. If result is POSITIVE SARS-CoV-2 target nucleic acids are DETECTED. The SARS-CoV-2 RNA is generally detectable in upper and lower  respiratory specimens dur ing the acute phase of infection.  Positive  results are indicative of active infection with SARS-CoV-2.  Clinical  correlation with patient history and other diagnostic information is  necessary to determine patient infection status.  Positive results do  not rule out bacterial infection or co-infection with  other viruses. If result is PRESUMPTIVE POSTIVE SARS-CoV-2 nucleic acids MAY BE PRESENT.   A presumptive positive result was obtained on the submitted specimen  and confirmed on repeat testing.  While 2019 novel coronavirus  (SARS-CoV-2) nucleic acids may be present in the submitted sample  additional confirmatory testing may be necessary for epidemiological  and / or clinical management purposes  to differentiate between  SARS-CoV-2 and other Sarbecovirus currently known to infect humans.  If clinically indicated additional testing with an alternate test  methodology [phone]) is advised. The SARS-CoV-2 RNA is generally  detectable in upper and lower respiratory sp ecimens during the acute  phase of infection. The expected result is Negative. Fact Sheet for Patients:  BoilerBrush.com.cy Fact Sheet for Healthcare Providers: https://pope.com/ This test is not yet approved or cleared by the [place] FDA and has been authorized for detection and/or diagnosis of SARS-CoV-2 by FDA under an Emergency Use Authorization (EUA).  This EUA will remain in effect (meaning this test can be used) for the duration of the COVID-19 declaration under Section 564(b)(1) of the Act, 21 U.S.C. section 360bbb-3(b)(1), unless the authorization is terminated or revoked sooner. Performed at [facility], [address] [place], [place] [address]       Radiology Studies: Vas [place] Abi With/wo Tbi  Result Date: 01/12/2019 LOWER EXTREMITY DOPPLER STUDY Indications: Pain.  Performing Technologist: Blanch Media RVS  Examination Guidelines: A complete evaluation includes at minimum, Doppler waveform signals and systolic blood pressure reading at the level of bilateral brachial, anterior tibial, and posterior tibial arteries, when vessel segments are accessible. Bilateral testing is considered an integral part of a complete examination.  Photoelectric Plethysmograph (PPG) waveforms and toe systolic pressure readings are included as required and additional duplex testing as needed. Limited examinations for reoccurring indications may be performed as noted.  ABI Findings: +--------+------------------+-----+---------+--------+ Right   Rt Pressure (mmHg)IndexWaveform Comment  +--------+------------------+-----+---------+--------+ [id number]                    triphasic         +--------+------------------+-----+---------+--------+ PTA     180               1.19 triphasic         +--------+------------------+-----+---------+--------+ DP      164               1.09 triphasic         +--------+------------------+-----+---------+--------+ +--------+------------------+-----+---------+-------+ Left    Lt Pressure (mmHg)IndexWaveform Comment +--------+------------------+-----+---------+-------+ [id number]                    triphasic        +--------+------------------+-----+---------+-------+ PTA     180               1.19 triphasic        +--------+------------------+-----+---------+-------+ DP      170               1.13 triphasic        +--------+------------------+-----+---------+-------+ +-------+-----------+-----------+------------+------------+ ABI/TBIToday's ABIToday's TBIPrevious ABIPrevious TBI +-------+-----------+-----------+------------+------------+ Right  1.19                                           +-------+-----------+-----------+------------+------------+ Left   1.19                                           +-------+-----------+-----------+------------+------------+  Summary: Right: Resting right ankle-brachial index is within normal range. No evidence of significant right lower extremity arterial disease. Left: Resting left ankle-brachial index is within normal range. No evidence of significant left lower extremity arterial disease.  *See table(s) above for measurements  and observations.  Electronically signed by Lemar Livings MD on 01/12/2019 at 4:20:02 PM.   Final     Scheduled Meds: . amLODipine  10 mg Oral Daily  . folic acid  1 mg Oral Daily  . gabapentin  300 mg Oral TID  . metoprolol tartrate  12.5 mg Oral BID  . multivitamin with minerals  1 tablet Oral Daily  . thiamine  100 mg Oral Daily  . Vitamin D (Ergocalciferol)  50,000 Units Oral Q7 days  . warfarin  10 mg Oral ONCE-1800  . Warfarin - Pharmacist Dosing Inpatient   Does not apply q1800   Continuous Infusions: . heparin 1,500 Units/hr (01/13/19 0838)     LOS: 3 days   Assessment & Plan:   Active Problems:   Leukocytosis   Alcohol abuse   Elevated LFTs   Hepatic steatosis   Alcohol dependence with uncomplicated withdrawal (HCC)   Alcohol withdrawal (HCC)   Vitamin D deficiency   Folic acid deficiency   Non-sustained ventricular tachycardia (HCC)   Lactic acidosis   Pulmonary embolism (HCC)   Acute metabolic encephalopathy  Acute pulmonary embolism: Feeling well.  Continues to be on Coumadin with bridging heparin drip.  INR is still subtherapeutic.  Coumadin dosed per pharmacy.  Sinus tachycardia: Getting better.  Could very well be due to PE or anxiety.  Will start on Lopressor 12.5 mg twice daily.  Hypertension: Much better now.  Continue amlodipine.  Chronic alcohol abuse: No signs of withdrawal.  Continue multivitamins and CIWA protocol.  Has not required any Ativan.  Macrocytic anemia with folic acid deficiency: Currently stable.  Continue folic acid supplement.  History of B12 deficiency: Level normal here.  Bilateral lower extremity pain/neuropathy: Not diabetic.  Could be due to chronic alcoholism.  Continue gabapentin.  Vitamin D deficiency: Supplementation.  Encephalopathy: Was likely due to alcoholism.  Currently alert and oriented.  DVT prophylaxis: Heparin drip Code Status: Full code Family Communication: Discussed with the patient Disposition Plan:  Will be discharged home once INR becomes therapeutic.   Time spent: 30 min    Hughie Closs, MD [facility] Pager [phone]  If 7PM-7AM, please contact night-coverage [email or web address] Password [facility] 01/13/2019, 9:44 AM

## 2019-01-13 NOTE — Progress Notes (Signed)
Pt was placed back on tele by the NT; upon this RN coming into the room to give PRN pain medicine; pt stated she wanted to take the tele off for a little while. This RN explained to reason for needing to be able to monitor her heart rate and rhythm. Pt stated, "Since it was put on with the new stickers I think it may be turned up too high and is causing her face to twitch." I explained how the tele works and restated the need to keep monitoring pt. Pt removed tele and stated, "I will take a nap and if my face stops twitching I will place it back on then." MD aware. Pts vitals remain WNL. Will continue to monitor.

## 2019-01-13 NOTE — Progress Notes (Signed)
ANTICOAGULATION CONSULT NOTE  Pharmacy Consult for Heparin, warfarin - D3 overlap Indication: pulmonary embolus  Allergies  Allergen Reactions  . Ibuprofen     Gastric bypass surgery and history of gi bleed.    Patient Measurements: Height: 5\' 2"  (157.5 cm) Weight: 191 lb 12.8 oz (87 kg) IBW/kg (Calculated) : 50.1 Heparin Dosing Weight: 70 kg  Vital Signs: Temp: 98.4 F (36.9 C) (05/27 0500) Temp Source: Oral (05/27 0500) BP: 129/97 (05/27 0502) Pulse Rate: 117 (05/27 0502)  Labs: Recent Labs    01/10/19 0813  01/11/19 0542 01/12/19 0525 01/13/19 0624  HGB  --   --  8.5*  --  9.5*  HCT  --   --  26.1*  --  29.4*  PLT  --   --  218  --  226  LABPROT  --   --   --  12.5 13.0  INR  --   --   --  0.9 1.0  HEPARINUNFRC 0.31   < > 0.40 0.45 0.29*  CREATININE  --   --  0.56  --  0.52  TROPONINI <0.03  --   --   --   --    < > = values in this interval not displayed.    Estimated Creatinine Clearance: 97.7 mL/min (by C-G formula based on SCr of 0.52 mg/dL).   Medical History: Past Medical History:  Diagnosis Date  . Neuropathy 01/09/2019    Medications:  Infusions:  . heparin 1,400 Units/hr (01/12/19 1156)    Assessment: Patient with small segmental PE. Dopplers neg for DVT.  Baseline coags WNL.  Heparin per pharmacy for PE.   01/13/2019  Heparin level slightly SUBtherapeutic on current IV heparin rate of 1400 units/hr    No bleeding reported  Hgb improved  Plts stable  INR subtherapeutic as expected s/p 2 doses of warfarin 7.5mg   Goal of Therapy:  Heparin level 0.3-0.7 units/ml Monitor platelets by anticoagulation protocol: Yes   Plan:  1) Increase IV heparin drip from 1400 units/hr to 1500 units/hr 2) Daily CBC and HL while on heparin 3) Today will be overlap D3 of min 5 of warfarin/heparin then continue overlap until INR therapeutic x 2 days 4) Warfarin 10mg  today at 1800 5) Will educate prior to discharge 6) Note that DOAC's not recommended  s/p gastric bypass   Hessie Knows, PharmD, BCPS 01/13/2019 8:12 AM

## 2019-01-13 NOTE — Progress Notes (Signed)
PT Cancellation Note  Patient Details Name: Stacy [name] MRN: [id number] DOB: [birth year]   Cancelled Treatment:    Reason Eval/Treat Not Completed: Medical issues which prohibited therapy - Heparin subtherapeutic, pt with known PE. PT to check back when at therapeutic level.   Nicola Police, PT Acute Rehabilitation Services Pager [phone]  Office [phone]    Tyrone Apple D Despina Hidden 01/13/2019, 11:52 AM

## 2019-01-14 LAB — CBC
HCT: 31.1 % — ABNORMAL LOW (ref 36.0–46.0)
Hemoglobin: 9.7 g/dL — ABNORMAL LOW (ref 12.0–15.0)
MCH: 32.9 pg (ref 26.0–34.0)
MCHC: 31.2 g/dL (ref 30.0–36.0)
MCV: 105.4 fL — ABNORMAL HIGH (ref 80.0–100.0)
Platelets: 215 10*3/uL (ref 150–400)
RBC: 2.95 MIL/uL — ABNORMAL LOW (ref 3.87–5.11)
RDW: 18.6 % — ABNORMAL HIGH (ref 11.5–15.5)
WBC: 6.8 10*3/uL (ref 4.0–10.5)
nRBC: 0 % (ref 0.0–0.2)

## 2019-01-14 LAB — HEPARIN LEVEL (UNFRACTIONATED): Heparin Unfractionated: 0.46 IU/mL (ref 0.30–0.70)

## 2019-01-14 LAB — PROTIME-INR
INR: 1.2 (ref 0.8–1.2)
Prothrombin Time: 14.6 seconds (ref 11.4–15.2)

## 2019-01-14 MED ORDER — ENOXAPARIN (LOVENOX) PATIENT EDUCATION KIT
PACK | Freq: Once | Status: AC
  Administered 2019-01-14: 14:00:00
  Filled 2019-01-14: qty 1

## 2019-01-14 MED ORDER — WARFARIN SODIUM 5 MG PO TABS
10.0000 mg | ORAL_TABLET | Freq: Once | ORAL | Status: AC
  Administered 2019-01-14: 18:00:00 10 mg via ORAL
  Filled 2019-01-14: qty 2

## 2019-01-14 MED ORDER — ENOXAPARIN SODIUM 100 MG/ML ~~LOC~~ SOLN
1.0000 mg/kg | Freq: Two times a day (BID) | SUBCUTANEOUS | Status: DC
  Administered 2019-01-14 – 2019-01-15 (×2): 85 mg via SUBCUTANEOUS
  Filled 2019-01-14 (×2): qty 1

## 2019-01-14 NOTE — Discharge Instructions (Signed)

## 2019-01-14 NOTE — Evaluation (Addendum)
Physical Therapy Evaluation Patient Details Name: Stacy [name] MRN: [id number] DOB: April 04, 1981 Today's Date: 01/14/2019   History of Present Illness  38 year old female with history of gastric bypass surgery, obesity, chronic alcohol abuse who presents to the emergency department with complaints of bilateral foot pain; CT Angioof chest showed pulmonary embolism on the right lower lobe.  Clinical Impression  Patient evaluated by Physical Therapy with no further acute PT needs identified. All education has been completed and the patient has no further questions.  Reviewed RW safety with pt, feels she will need RW for home d/t continuing foot pain. Encouraged pt to perform ankle pumps and ankle circles as tolerated.  Pt cautioned regarding being up in room without assist as she is Heparinized d/t PE, INR still slightly subtherapeutic. See below for any follow-up Physical Therapy or equipment needs. PT is signing off. Thank you for this referral.     Follow Up Recommendations No PT follow up    Equipment Recommendations  Rolling walker with 5" wheels    Recommendations for Other Services       Precautions / Restrictions Precautions Precautions: None Restrictions Weight Bearing Restrictions: No      Mobility  Bed Mobility Overal bed mobility: Modified Independent                Transfers Overall transfer level: Modified independent                  Ambulation/Gait Ambulation/Gait assistance: Supervision           General Gait Details: pt reports being up in room amb to bathroom on her own; reviewed RW technique and safety with pt  Stairs            Wheelchair Mobility    Modified Rankin (Stroke Patients Only)       Balance Overall balance assessment: Needs assistance   Sitting balance-Leahy Scale: Normal       Standing balance-Leahy Scale: Fair Standing balance comment: reliant on UEs d/t pain with WBing                              Pertinent Vitals/Pain Pain Assessment: Faces Faces Pain Scale: Hurts even more Pain Location: bil feet Pain Descriptors / Indicators: Spasm;Sore Pain Intervention(s): Limited activity within patient's tolerance;Premedicated before session;Heat applied;Repositioned    Home Living Family/patient expects to be discharged to:: Private residence Living Arrangements: Spouse/significant other;Children             Home Equipment: None      Prior Function Level of Independence: Independent               Hand Dominance        Extremity/Trunk Assessment   Upper Extremity Assessment Upper Extremity Assessment: Overall WFL for tasks assessed    Lower Extremity Assessment Lower Extremity Assessment: Overall WFL for tasks assessed(limited ankle ROM d/t pain/joint stiffness)       Communication   Communication: No difficulties  Cognition Arousal/Alertness: Awake/alert Behavior During Therapy: WFL for tasks assessed/performed Overall Cognitive Status: Within Functional Limits for tasks assessed                                        General Comments      Exercises     Assessment/Plan    PT Assessment Patent does not need  any further PT services  PT Problem List         PT Treatment Interventions      PT Goals (Current goals can be found in the Care Plan section)  Acute Rehab PT Goals Patient Stated Goal: less pain and go home PT Goal Formulation: All assessment and education complete, DC therapy    Frequency     Barriers to discharge        Co-evaluation               AM-PAC PT "6 Clicks" Mobility  Outcome Measure Help needed turning from your back to your side while in a flat bed without using bedrails?: None Help needed moving from lying on your back to sitting on the side of a flat bed without using bedrails?: None Help needed moving to and from a bed to a chair (including a wheelchair)?: None Help needed  standing up from a chair using your arms (e.g., wheelchair or bedside chair)?: None Help needed to walk in hospital room?: None Help needed climbing 3-5 steps with a railing? : A Little 6 Click Score: 23    End of Session   Activity Tolerance: Patient tolerated treatment well Patient left: in bed;with call bell/phone within reach   PT Visit Diagnosis: Unsteadiness on feet (R26.81)    Time: 0383-3383 PT Time Calculation (min) (ACUTE ONLY): 12 min   Charges:   PT Evaluation $PT Eval Low Complexity: 1 Low          Drucilla Chalet, PT  Pager: [phone] Acute Rehab Dept [facility]): [phone]   01/14/2019   [facility] 01/14/2019, 3:51 PM

## 2019-01-14 NOTE — Progress Notes (Signed)
PROGRESS NOTE    JALASIA ESKRIDGE  XBJ:[id number] DOB: 1981-05-28 DOA: 01/09/2019 PCP: Cristy Folks, MD   Brief Narrative:  Patient is a 38 year old female with history of gastric bypass surgery, obesity, chronic alcohol abuse who presents to the emergency department with complaints of bilateral foot pain.  She also reported heavy alcohol consumption.   Patient described bilateral foot pain as tingling and numbness from her thighs down to her feet  with no weakness.  Patient reported history of vitamin B12 deficiency.  Patient was noted to have hypertension, tachycardia, elevated liver enzymes on presentation.  D-dimer was elevated.  CT Angio of chest showed pulmonary embolism on the right lower lobe.  Started on heparin drip.Started on warfarin also.  Being monitored for alcohol withdrawal. Discharge planning after INR becomes therapeutic (2-3).  Consultants:   None  Procedures:   None  Antimicrobials:   None   Subjective: Patient seen and examined.  Again, she complains of burning pain in bilateral lower extremities.  No other complaint. Objective: Vitals:   01/13/19 2112 01/13/19 2330 01/14/19 0611 01/14/19 0643  BP: (!) 146/105 132/88 (!) 150/124 122/86  Pulse: (!) 111  (!) 116 (!) 105  Resp: 19  18   Temp: 98.4 F (36.9 C)  98 F (36.7 C)   TempSrc: Oral  Oral   SpO2: 95%  99%   Weight:      Height:        Intake/Output Summary (Last 24 hours) at 01/14/2019 1245 Last data filed at 01/13/2019 1739 Gross per 24 hour  Intake 1516.87 ml  Output -  Net 1516.87 ml   Filed Weights   01/09/19 1459  Weight: 87 kg    Examination: General exam: Appears calm and comfortable  Respiratory system: Clear to auscultation. Respiratory effort normal. Cardiovascular system: S1 & S2 heard, RRR. No JVD, murmurs, rubs, gallops or clicks. No pedal edema. Gastrointestinal system: Abdomen is nondistended, soft and nontender. No organomegaly or masses felt. Normal bowel  sounds heard. Central nervous system: Alert and oriented. No focal neurological deficits. Extremities: Symmetric 5 x 5 power. Skin: No rashes, lesions or ulcers Psychiatry: Judgement and insight appear normal. Mood & affect appropriate.   Data Reviewed: I have personally reviewed following labs and imaging studies  CBC: Recent Labs  Lab 01/09/19 1512 01/10/19 0204 01/11/19 0542 01/13/19 0624 01/14/19 0629  WBC 12.2* 8.1 5.8 6.7 6.8  NEUTROABS 10.1*  --  3.7 4.7  --   HGB 11.5* 10.9* 8.5* 9.5* 9.7*  HCT 36.2 33.8* 26.1* 29.4* 31.1*  MCV 102.8* 106.0* 103.6* 105.0* 105.4*  PLT 373 285 218 226 215   Basic Metabolic Panel: Recent Labs  Lab 01/09/19 1514 01/09/19 1602 01/09/19 2000 01/11/19 0542 01/13/19 0624  NA 142  --   --  140 140  K 4.0  --   --  3.6 3.6  CL 107  --   --  108 108  CO2 21*  --   --  24 25  GLUCOSE 72  --   --  90 100*  BUN 5*  --   --  6 <5*  CREATININE 0.64  --   --  0.56 0.52  CALCIUM 9.1  --   --  8.4* 9.0  MG  --  2.0  --   --   --   PHOS  --   --  4.1  --   --    GFR: Estimated Creatinine Clearance: 97.7 mL/min (by C-G formula based  on SCr of 0.52 mg/dL). Liver Function Tests: Recent Labs  Lab 01/09/19 1514  AST 99*  ALT 85*  ALKPHOS 126  BILITOT 1.2  PROT 8.0  ALBUMIN 4.1   Recent Labs  Lab 01/09/19 2000  LIPASE 53*   Recent Labs  Lab 01/10/19 0204  AMMONIA 35   Coagulation Profile: Recent Labs  Lab 01/09/19 2000 01/12/19 0525 01/13/19 0624 01/14/19 0629  INR 1.0 0.9 1.0 1.2   Cardiac Enzymes: Recent Labs  Lab 01/09/19 2000 01/10/19 0204 01/10/19 0813  CKTOTAL 51  --   --   TROPONINI <0.03 <0.03 <0.03   BNP (last 3 results) No results for input(s): PROBNP in the last 8760 hours. HbA1C: Recent Labs    01/13/19 0624  HGBA1C 4.5*   CBG: Recent Labs  Lab 01/09/19 1521  GLUCAP 73   Lipid Profile: No results for input(s): CHOL, HDL, LDLCALC, TRIG, CHOLHDL, LDLDIRECT in the last 72 hours. Thyroid  Function Tests: No results for input(s): TSH, T4TOTAL, FREET4, T3FREE, THYROIDAB in the last 72 hours. Anemia Panel: No results for input(s): VITAMINB12, FOLATE, FERRITIN, TIBC, IRON, RETICCTPCT in the last 72 hours. Sepsis Labs: Recent Labs  Lab 01/09/19 2000 01/10/19 0001 01/10/19 0204  LATICACIDVEN 3.1* 2.3* 1.3    Recent Results (from the past 240 hour(s))  SARS Coronavirus 2 (CEPHEID - Performed in [facility] hospital lab), Hosp Order     Status: None   Collection Time: 01/09/19  6:27 PM  Result Value Ref Range Status   SARS Coronavirus 2 NEGATIVE NEGATIVE Final    Comment: (NOTE) If result is NEGATIVE SARS-CoV-2 target nucleic acids are NOT DETECTED. The SARS-CoV-2 RNA is generally detectable in upper and lower  respiratory specimens during the acute phase of infection. The lowest  concentration of SARS-CoV-2 viral copies this assay can detect is 250  copies / mL. A negative result does not preclude SARS-CoV-2 infection  and should not be used as the sole basis for treatment or other  patient management decisions.  A negative result may occur with  improper specimen collection / handling, submission of specimen other  than nasopharyngeal swab, presence of viral mutation(s) within the  areas targeted by this assay, and inadequate number of viral copies  (<250 copies / mL). A negative result must be combined with clinical  observations, patient history, and epidemiological information. If result is POSITIVE SARS-CoV-2 target nucleic acids are DETECTED. The SARS-CoV-2 RNA is generally detectable in upper and lower  respiratory specimens dur ing the acute phase of infection.  Positive  results are indicative of active infection with SARS-CoV-2.  Clinical  correlation with patient history and other diagnostic information is  necessary to determine patient infection status.  Positive results do  not rule out bacterial infection or co-infection with other viruses. If result  is PRESUMPTIVE POSTIVE SARS-CoV-2 nucleic acids MAY BE PRESENT.   A presumptive positive result was obtained on the submitted specimen  and confirmed on repeat testing.  While 2019 novel coronavirus  (SARS-CoV-2) nucleic acids may be present in the submitted sample  additional confirmatory testing may be necessary for epidemiological  and / or clinical management purposes  to differentiate between  SARS-CoV-2 and other Sarbecovirus currently known to infect humans.  If clinically indicated additional testing with an alternate test  methodology [phone]) is advised. The SARS-CoV-2 RNA is generally  detectable in upper and lower respiratory sp ecimens during the acute  phase of infection. The expected result is Negative. Fact Sheet for Patients:  BoilerBrush.com.cyhttps://www.fda.gov/media/136312/download Fact Sheet for Healthcare Providers: https://pope.com/https://www.fda.gov/media/136313/download This test is not yet approved or cleared by the [place] FDA and has been authorized for detection and/or diagnosis of SARS-CoV-2 by FDA under an Emergency Use Authorization (EUA).  This EUA will remain in effect (meaning this test can be used) for the duration of the COVID-19 declaration under Section 564(b)(1) of the Act, 21 U.S.C. section 360bbb-3(b)(1), unless the authorization is terminated or revoked sooner. Performed at [facility], [address] [place], [place] [address]       Radiology Studies: Vas Koreas Abi With/wo Tbi  Result Date: 01/12/2019 LOWER EXTREMITY DOPPLER STUDY Indications: Pain.  Performing Technologist: Blanch MediaMegan Riddle RVS  Examination Guidelines: A complete evaluation includes at minimum, Doppler waveform signals and systolic blood pressure reading at the level of bilateral brachial, anterior tibial, and posterior tibial arteries, when vessel segments are accessible. Bilateral testing is considered an integral part of a complete examination. Photoelectric Plethysmograph (PPG)  waveforms and toe systolic pressure readings are included as required and additional duplex testing as needed. Limited examinations for reoccurring indications may be performed as noted.  ABI Findings: +--------+------------------+-----+---------+--------+ Right   Rt Pressure (mmHg)IndexWaveform Comment  +--------+------------------+-----+---------+--------+ [id number]                    triphasic         +--------+------------------+-----+---------+--------+ PTA     180               1.19 triphasic         +--------+------------------+-----+---------+--------+ DP      164               1.09 triphasic         +--------+------------------+-----+---------+--------+ +--------+------------------+-----+---------+-------+ Left    Lt Pressure (mmHg)IndexWaveform Comment +--------+------------------+-----+---------+-------+ [id number]                    triphasic        +--------+------------------+-----+---------+-------+ PTA     180               1.19 triphasic        +--------+------------------+-----+---------+-------+ DP      170               1.13 triphasic        +--------+------------------+-----+---------+-------+ +-------+-----------+-----------+------------+------------+ ABI/TBIToday's ABIToday's TBIPrevious ABIPrevious TBI +-------+-----------+-----------+------------+------------+ Right  1.19                                           +-------+-----------+-----------+------------+------------+ Left   1.19                                           +-------+-----------+-----------+------------+------------+  Summary: Right: Resting right ankle-brachial index is within normal range. No evidence of significant right lower extremity arterial disease. Left: Resting left ankle-brachial index is within normal range. No evidence of significant left lower extremity arterial disease.  *See table(s) above for measurements and observations.  Electronically  signed by Lemar LivingsBrandon Cain MD on 01/12/2019 at 4:20:02 PM.   Final     Scheduled Meds: . amLODipine  10 mg Oral Daily  . enoxaparin (LOVENOX) injection  1 mg/kg Subcutaneous Q12H  . enoxaparin   Does not apply Once  . folic acid  1 mg  Oral Daily  . gabapentin  300 mg Oral TID  . metoprolol tartrate  12.5 mg Oral BID  . multivitamin with minerals  1 tablet Oral Daily  . thiamine  100 mg Oral Daily  . Vitamin D (Ergocalciferol)  50,000 Units Oral Q7 days  . warfarin  10 mg Oral ONCE-1800  . Warfarin - Pharmacist Dosing Inpatient   Does not apply q1800   Continuous Infusions: . heparin 1,500 Units/hr (01/14/19 0220)     LOS: 4 days   Assessment & Plan:   Active Problems:   Leukocytosis   Alcohol abuse   Elevated LFTs   Hepatic steatosis   Alcohol dependence with uncomplicated withdrawal (HCC)   Alcohol withdrawal (HCC)   Vitamin D deficiency   Folic acid deficiency   Non-sustained ventricular tachycardia (HCC)   Lactic acidosis   Pulmonary embolism (HCC)   Acute metabolic encephalopathy  Acute pulmonary embolism: Feeling well.  Continues to be on Coumadin with bridging heparin drip.  INR is still subtherapeutic despite of receiving heavy doses of Coumadin.  She is a stable so we discussed about switching her to Lovenox and educated her on injecting herself and perhaps planning on discharging tomorrow on Coumadin with bridging Lovenox.  We will stop her heparin at around 6 PM today and start her Lovenox weight-based around 8 PM today.  I did discuss this in detail with the nursing staff to educate her on that and be very specific with the timings..  Coumadin dosed per pharmacy.  Sinus tachycardia: Getting better.  Could very well be due to PE.  Continue low-dose Lopressor.  Hypertension: Much better now.  Continue amlodipine.  Chronic alcohol abuse: No signs of withdrawal.  Continue multivitamins and CIWA protocol.  Has not required any Ativan.  Macrocytic anemia with folic acid  deficiency: Currently stable.  Continue folic acid supplement.  History of B12 deficiency: Level normal here.  Bilateral lower extremity pain/neuropathy: Not diabetic.  Could be due to chronic alcoholism.  Continue gabapentin.  Vitamin D deficiency: Supplementation.  Encephalopathy: Was likely due to alcoholism.  Currently alert and oriented.  DVT prophylaxis: Heparin drip Code Status: Full code Family Communication: Discussed with the patient Disposition Plan: Will be discharged home once INR becomes therapeutic.   Time spent: 28 min    Hughie Closs, MD [facility] Pager [phone]  If 7PM-7AM, please contact night-coverage [email or web address] Password [facility] 01/14/2019, 12:45 PM

## 2019-01-14 NOTE — Progress Notes (Addendum)
ANTICOAGULATION CONSULT NOTE  Pharmacy Consult for Heparin, warfarin - D4 overlap Indication: pulmonary embolus  Allergies  Allergen Reactions  . Ibuprofen     Gastric bypass surgery and history of gi bleed.    Patient Measurements: Height: 5\' 2"  (157.5 cm) Weight: 191 lb 12.8 oz (87 kg) IBW/kg (Calculated) : 50.1 Heparin Dosing Weight: 70 kg  Vital Signs: Temp: 98 F (36.7 C) (05/28 0611) Temp Source: Oral (05/28 0611) BP: 122/86 (05/28 0643) Pulse Rate: 105 (05/28 0643)  Labs: Recent Labs    01/12/19 0525 01/13/19 0624 01/14/19 0629  HGB  --  9.5* 9.7*  HCT  --  29.4* 31.1*  PLT  --  226 215  LABPROT 12.5 13.0 14.6  INR 0.9 1.0 1.2  HEPARINUNFRC 0.45 0.29* 0.46  CREATININE  --  0.52  --     Estimated Creatinine Clearance: 97.7 mL/min (by C-G formula based on SCr of 0.52 mg/dL).   Medical History: Past Medical History:  Diagnosis Date  . Neuropathy 01/09/2019    Medications:  Infusions:  . heparin 1,500 Units/hr (01/14/19 0220)    Assessment: Patient with small segmental PE. Dopplers neg for DVT.  Baseline coags WNL.  Heparin per pharmacy for PE.   01/14/2019  Heparin level therapeutic on current IV heparin rate of 1500 units/hr    No bleeding reported  Hgb stable  Plts stable  INR subtherapeutic s/p 3 doses of warfarin but appears to start to be responding  Goal of Therapy:  Heparin level 0.3-0.7 units/ml Monitor platelets by anticoagulation protocol: Yes   Plan:  1) Continue IV heparin rate of 1500 units/hr 2) Daily CBC and HL while on heparin 3) Today will be overlap D4 of min 5 of warfarin/heparin then continue overlap until INR therapeutic x 2 days 4) Repeat warfarin 10mg  today at 1800 5) Education completed today 5/28, paperwork provided 6) Note that DOAC's not recommended s/p gastric bypass   Hessie Knows, PharmD, BCPS 01/14/2019 8:51 AM

## 2019-01-15 LAB — CBC
HCT: 32.2 % — ABNORMAL LOW (ref 36.0–46.0)
Hemoglobin: 9.8 g/dL — ABNORMAL LOW (ref 12.0–15.0)
MCH: 32.2 pg (ref 26.0–34.0)
MCHC: 30.4 g/dL (ref 30.0–36.0)
MCV: 105.9 fL — ABNORMAL HIGH (ref 80.0–100.0)
Platelets: 206 10*3/uL (ref 150–400)
RBC: 3.04 MIL/uL — ABNORMAL LOW (ref 3.87–5.11)
RDW: 18.5 % — ABNORMAL HIGH (ref 11.5–15.5)
WBC: 6.3 10*3/uL (ref 4.0–10.5)
nRBC: 0 % (ref 0.0–0.2)

## 2019-01-15 LAB — PROTIME-INR
INR: 1.2 (ref 0.8–1.2)
Prothrombin Time: 14.7 seconds (ref 11.4–15.2)

## 2019-01-15 MED ORDER — GABAPENTIN 300 MG PO CAPS: 300.0000 mg | ORAL_CAPSULE | Freq: Three times a day (TID) | ORAL | 0 refills | Status: DC

## 2019-01-15 MED ORDER — WARFARIN SODIUM 7.5 MG PO TABS: 15.0000 mg | ORAL_TABLET | Freq: Every day | ORAL | 0 refills | Status: DC

## 2019-01-15 MED ORDER — AMLODIPINE BESYLATE 10 MG PO TABS: 10.0000 mg | ORAL_TABLET | Freq: Every day | ORAL | 0 refills | Status: DC

## 2019-01-15 MED ORDER — METOPROLOL TARTRATE 25 MG PO TABS: 12.5000 mg | ORAL_TABLET | Freq: Two times a day (BID) | ORAL | 0 refills | Status: DC

## 2019-01-15 MED ORDER — ENOXAPARIN SODIUM 100 MG/ML ~~LOC~~ SOLN: 1.0000 mg/kg | Freq: Two times a day (BID) | SUBCUTANEOUS | 0 refills | Status: DC

## 2019-01-15 MED ORDER — WARFARIN SODIUM 5 MG PO TABS
15.0000 mg | ORAL_TABLET | Freq: Once | ORAL | Status: AC
  Administered 2019-01-15: 12:00:00 15 mg via ORAL
  Filled 2019-01-15: qty 3

## 2019-01-15 NOTE — Discharge Summary (Signed)
Physician Discharge Summary  Stacy [name] ZOX:[id number] DOB: 1981/01/14 DOA: 01/09/2019  PCP: Cristy Folks, MD  Admit date: 01/09/2019 Discharge date: 01/15/2019  Admitted From: Home Disposition: Home  Recommendations for Outpatient Follow-up:  1. Follow up with PCP in 3 to 4 days 2. Please obtain BMP/CBC and INR in 3 to 4 days when seen by PCP 3. Please follow up on the following pending results:  Home Health: None Equipment/Devices: None  Discharge Condition: Stable CODE STATUS: Full code Diet recommendation: Regular  Subjective: Seen and examined.  No complaints other than bilateral tingling feet pain due to peripheral neuropathy which is chronic for her.  Brief/Interim Summary: Patient is a 38 year old female with history of gastric bypass surgery, obesity, chronic alcohol abuse who presented to the emergency department with complaints of bilateral foot pain. She also reported heavy alcohol consumption. Patient described bilateral foot pain as tingling and numbness from her thighs down to her feet with no weakness. Patient reported history of vitamin B12 deficiency. Patient was noted to have hypertension, tachycardia, elevated liver enzymes on presentation. D-dimer was elevated. CT Angioof chest showed pulmonary embolism on the right lower lobe. Due to having history of gastric bypass, she was not a candidate for NOACS so she was started on heparin drip along with Coumadin however despite of 3 to 4 days of being on Coumadin, her INR remained subtherapeutic so she was switched from heparin drip to subcutaneous Lovenox twice daily starting yesterday and patient has injected herself twice so far in front of the nurses and feels comfortable doing that going home.  She has remained hemodynamically stable otherwise.  She did not have any alcohol withdrawal symptoms at all.  She had an episode of nonsustained V. tach and has remained in sinus tachycardia which is also chronic  for her according to the patient.  She also had elevated blood pressure/hypertension so she was started on amlodipine 10 mg and Lopressor 12.5 mg twice daily.  Her gabapentin was increased from 600 p.o. daily to 300 p.o. 3 times daily which is being prescribed to her.  Please note that her new medications are amlodipine, Lopressor, increasing dose of gabapentin, Coumadin and Lovenox.  Patient is stable to be discharged today.  I strongly advised her to see PCP on Monday and get her INR tested.  I explained her several times that she needs to continue Lovenox twice daily as prescribed to her until her INR becomes therapeutic and remained therapeutic for 24 hours.  She verbalizes understanding.  I also went over all the potential risk factors of anticoagulants including bleeding risk.  She verbalized understanding that as well.   Discharge Diagnoses:  Active Problems:   Leukocytosis   Alcohol abuse   Elevated LFTs   Hepatic steatosis   Alcohol dependence with uncomplicated withdrawal (HCC)   Alcohol withdrawal (HCC)   Vitamin D deficiency   Folic acid deficiency   Non-sustained ventricular tachycardia (HCC)   Lactic acidosis   Pulmonary embolism (HCC)   Acute metabolic encephalopathy    Discharge Instructions  Discharge Instructions    Discharge patient   Complete by:  As directed    Discharge disposition:  01-Home or Self Care   Discharge patient date:  01/15/2019     Allergies as of 01/15/2019      Reactions   Ibuprofen    Gastric bypass surgery and history of gi bleed.      Medication List    STOP taking these medications   gabapentin 600  MG tablet Commonly known as:  NEURONTIN Replaced by:  gabapentin 300 MG capsule     TAKE these medications   acetaminophen 500 MG tablet Commonly known as:  TYLENOL Take 1,000 mg by mouth every 6 (six) hours as needed for mild pain or moderate pain.   amLODipine 10 MG tablet Commonly known as:  NORVASC Take 1 tablet (10 mg total) by  mouth daily for 30 days.   enoxaparin 100 MG/ML injection Commonly known as:  LOVENOX Inject 0.85 mLs (85 mg total) into the skin every 12 (twelve) hours for 5 days.   ferrous sulfate 325 (65 FE) MG tablet Take 325 mg by mouth daily with breakfast.   gabapentin 300 MG capsule Commonly known as:  NEURONTIN Take 1 capsule (300 mg total) by mouth 3 (three) times daily for 30 days. Replaces:  gabapentin 600 MG tablet   Guaifenesin 1200 MG Tb12 Take 1 tablet (1,200 mg total) by mouth 2 (two) times daily.   HYDROcodone-acetaminophen 5-325 MG tablet Commonly known as:  NORCO/VICODIN Take 2 tablets by mouth every 4 (four) hours as needed.   methocarbamol 500 MG tablet Commonly known as:  ROBAXIN Take 1 tablet (500 mg total) by mouth 2 (two) times daily.   metoprolol tartrate 25 MG tablet Commonly known as:  LOPRESSOR Take 0.5 tablets (12.5 mg total) by mouth 2 (two) times daily for 30 days.   multivitamin with minerals Tabs tablet Take 1 tablet by mouth daily.   ondansetron 4 MG disintegrating tablet Commonly known as:  Zofran ODT Take 1 tablet (4 mg total) by mouth every 8 (eight) hours as needed for nausea or vomiting.   ondansetron 4 MG tablet Commonly known as:  ZOFRAN Take 1 tablet (4 mg total) by mouth every 6 (six) hours.   oxyCODONE-acetaminophen 5-325 MG tablet Commonly known as:  PERCOCET/ROXICET Take 1 tablet by mouth every 6 (six) hours as needed for severe pain.   promethazine-dextromethorphan 6.25-15 MG/5ML syrup Commonly known as:  PROMETHAZINE-DM Take 5 mLs by mouth 4 (four) times daily as needed for cough.   warfarin 7.5 MG tablet Commonly known as:  Coumadin Take 2 tablets (15 mg total) by mouth daily for 30 days.      Follow-up Information    Cristy FolksFleming, Gregory, MD. Schedule an appointment as soon as possible for a visit in 1 week(s).   Specialties:  Pediatrics, Cardiology Contact information: [address], [address] [place]  [id number] [phone]          Allergies  Allergen Reactions  . Ibuprofen     Gastric bypass surgery and history of gi bleed.    Consultations: None   Procedures/Studies: Dg Chest 2 View  Result Date: 01/09/2019 CLINICAL DATA:  Tachycardia EXAM: CHEST - 2 VIEW COMPARISON:  01/06/2011 FINDINGS: The heart size and mediastinal contours are within normal limits. Both lungs are clear. The visualized skeletal structures are unremarkable. IMPRESSION: No active cardiopulmonary disease. Electronically Signed   By: Alcide CleverMark  Lukens M.D.   On: 01/09/2019 19:10   Ct Angio Chest Pe W Or Wo Contrast  Result Date: 01/09/2019 CLINICAL DATA:  Angina.  Dyspnea. EXAM: CT ANGIOGRAPHY CHEST WITH CONTRAST TECHNIQUE: Multidetector CT imaging of the chest was performed using the standard protocol during bolus administration of intravenous contrast. Multiplanar CT image reconstructions and MIPs were obtained to evaluate the vascular anatomy. CONTRAST:  100mL OMNIPAQUE IOHEXOL 350 MG/ML SOLN COMPARISON:  No recent relevant prior available for comparison. Correlation made with chest x-ray from  the same day. FINDINGS: Cardiovascular: There is an acute segmental pulmonary embolus involving the right medial basilar segment of the right lower lobe. There are additional subsegmental pulmonary emboli involving the right lower lobe. There is no CT evidence of right heart strain. There is no pericardial effusion. No aortic dissection. The main pulmonary artery is dilated measuring 3.1 cm. Mediastinum/Nodes: No enlarged mediastinal, hilar, or axillary lymph nodes. Thyroid gland, trachea, and esophagus demonstrate no significant findings. Lungs/Pleura: Lungs are clear. No pleural effusion or pneumothorax. Upper Abdomen: There is severe hepatic steatosis. The patient appears to be status post prior gastric bypass. Musculoskeletal: No chest wall abnormality. No acute or significant osseous findings. Review of the MIP images confirms  the above findings. IMPRESSION: 1. Acute segmental and subsegmental pulmonary emboli primarily involving the right lower lobe as detailed above. The overall burden is low. There is no CT evidence of right heart strain. The main pulmonary artery is dilated which can be seen in patients with elevated PA pressures. 2. Severe hepatic steatosis. 3. The lungs are clear. These results were called by telephone at the time of interpretation on 01/09/2019 at 10:49 pm to RN Jean Rosenthal, who verbally acknowledged these results. Electronically Signed   By: Katherine Mantle M.D.   On: 01/09/2019 22:51   Dg Abdomen Acute W/chest  Result Date: 01/06/2019 CLINICAL DATA:  38 year old female with generalized body pain and nausea for 2-3 days. EXAM: DG ABDOMEN ACUTE W/ 1V CHEST COMPARISON:  Chest radiographs 11/28/2018. FINDINGS: Lung volumes and mediastinal contours remain normal. Visualized tracheal air column is within normal limits. Both lungs are clear. No pneumothorax or pneumoperitoneum. Postoperative changes to bowel in the left abdomen. Staple line about the stomach with adjacent surgical clips. Non obstructed bowel gas pattern. Abdominal and pelvic visceral contours are within normal limits. No acute osseous abnormality identified. Incidental pelvic phleboliths. IMPRESSION: 1. 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 Normal bowel gas pattern, no free air. 2. No cardiopulmonary abnormality. Electronically Signed   By: Odessa Fleming M.D.   On: 01/06/2019 00:34   Vas [place] Vanice Sarah With/wo Tbi  Result Date: 01/12/2019 LOWER EXTREMITY DOPPLER STUDY Indications: Pain.  Performing Technologist: Blanch Media RVS  Examination Guidelines: A complete evaluation includes at minimum, Doppler waveform signals and systolic blood pressure reading at the level of bilateral brachial, anterior tibial, and posterior tibial arteries, when vessel segments are accessible. Bilateral testing is considered an integral part of a complete  examination. Photoelectric Plethysmograph (PPG) waveforms and toe systolic pressure readings are included as required and additional duplex testing as needed. Limited examinations for reoccurring indications may be performed as noted.  ABI Findings: +--------+------------------+-----+---------+--------+ Right   Rt Pressure (mmHg)IndexWaveform Comment  +--------+------------------+-----+---------+--------+ [id number]                    triphasic         +--------+------------------+-----+---------+--------+ PTA     180               1.19 triphasic         +--------+------------------+-----+---------+--------+ DP      164               1.09 triphasic         +--------+------------------+-----+---------+--------+ +--------+------------------+-----+---------+-------+ Left    Lt Pressure (mmHg)IndexWaveform Comment +--------+------------------+-----+---------+-------+ [id number]                    triphasic        +--------+------------------+-----+---------+-------+ PTA  180               1.19 triphasic        +--------+------------------+-----+---------+-------+ DP      170               1.13 triphasic        +--------+------------------+-----+---------+-------+ +-------+-----------+-----------+------------+------------+ ABI/TBIToday's ABIToday's TBIPrevious ABIPrevious TBI +-------+-----------+-----------+------------+------------+ Right  1.19                                           +-------+-----------+-----------+------------+------------+ Left   1.19                                           +-------+-----------+-----------+------------+------------+  Summary: Right: Resting right ankle-brachial index is within normal range. No evidence of significant right lower extremity arterial disease. Left: Resting left ankle-brachial index is within normal range. No evidence of significant left lower extremity arterial disease.  *See table(s) above for  measurements and observations.  Electronically signed by Lemar Livings MD on 01/12/2019 at 4:20:02 PM.   Final    Vas [place] Lower Extremity Venous (dvt)  Result Date: 01/11/2019  Lower Venous Study Indications: Edema.  Performing Technologist: Chanda Busing RVT  Examination Guidelines: A complete evaluation includes B-mode imaging, spectral Doppler, color Doppler, and power Doppler as needed of all accessible portions of each vessel. Bilateral testing is considered an integral part of a complete examination. Limited examinations for reoccurring indications may be performed as noted.  +---------+---------------+---------+-----------+----------+-------+ RIGHT    CompressibilityPhasicitySpontaneityPropertiesSummary +---------+---------------+---------+-----------+----------+-------+ CFV      Full           Yes      Yes                          +---------+---------------+---------+-----------+----------+-------+ SFJ      Full                                                 +---------+---------------+---------+-----------+----------+-------+ FV Prox  Full                                                 +---------+---------------+---------+-----------+----------+-------+ FV Mid   Full                                                 +---------+---------------+---------+-----------+----------+-------+ FV DistalFull                                                 +---------+---------------+---------+-----------+----------+-------+ PFV      Full                                                 +---------+---------------+---------+-----------+----------+-------+  POP      Full           Yes      Yes                          +---------+---------------+---------+-----------+----------+-------+ PTV      Full                                                 +---------+---------------+---------+-----------+----------+-------+ PERO     Full                                                  +---------+---------------+---------+-----------+----------+-------+   +---------+---------------+---------+-----------+----------+-------+ LEFT     CompressibilityPhasicitySpontaneityPropertiesSummary +---------+---------------+---------+-----------+----------+-------+ CFV      Full           Yes      Yes                          +---------+---------------+---------+-----------+----------+-------+ SFJ      Full                                                 +---------+---------------+---------+-----------+----------+-------+ FV Prox  Full                                                 +---------+---------------+---------+-----------+----------+-------+ FV Mid   Full                                                 +---------+---------------+---------+-----------+----------+-------+ FV DistalFull                                                 +---------+---------------+---------+-----------+----------+-------+ PFV      Full                                                 +---------+---------------+---------+-----------+----------+-------+ POP      Full           Yes      Yes                          +---------+---------------+---------+-----------+----------+-------+ PTV      Full                                                 +---------+---------------+---------+-----------+----------+-------+ PERO  Full                                                 +---------+---------------+---------+-----------+----------+-------+     Summary: Right: There is no evidence of deep vein thrombosis in the lower extremity. No cystic structure found in the popliteal fossa. Left: There is no evidence of deep vein thrombosis in the lower extremity. No cystic structure found in the popliteal fossa.  *See table(s) above for measurements and observations. Electronically signed by Waverly Ferrari MD on 01/11/2019 at 6:44:33 AM.    Final     US Abdomen Limited Ruq  Result Date: 12/22/2018 CLINICAL DATA:  Right upper quadrant pain EXAM: ULTRASOUND ABDOMEN LIMITED RIGHT UPPER QUADRANT COMPARISON:  None. FINDINGS: Gallbladder: No gallstones or wall thickening visualized. No sonographic Murphy sign noted by sonographer. Common bile duct: Diameter: 3.9 mm Liver: No focal lesion identified. Increased hepatic parenchymal echogenicity. Portal vein is patent on color Doppler imaging with normal direction of blood flow towards the liver. IMPRESSION: 1. No cholelithiasis or sonographic evidence of acute cholecystitis. 2. Increased hepatic echogenicity as can be seen with hepatic steatosis. Electronically Signed   By: Elige Ko   On: 12/22/2018 13:53     Discharge Exam: Vitals:   01/15/19 0407 01/15/19 0602  BP: (!) 132/104 (!) 138/101  Pulse: (!) 115 (!) 102  Resp: 20   Temp: 98.5 F (36.9 C)   SpO2: 99%    Vitals:   01/14/19 1719 01/14/19 1953 01/15/19 0407 01/15/19 0602  BP: 122/76 (!) 135/95 (!) 132/104 (!) 138/101  Pulse: (!) 106 97 (!) 115 (!) 102  Resp:  18 20   Temp:  98.4 F (36.9 C) 98.5 F (36.9 C)   TempSrc:  Oral Oral   SpO2:  96% 99%   Weight:      Height:        General: Pt is alert, awake, not in acute distress Cardiovascular: RRR, S1/S2 +, no rubs, no gallops Respiratory: CTA bilaterally, no wheezing, no rhonchi Abdominal: Soft, NT, ND, bowel sounds + Extremities: no edema, no cyanosis    The results of significant diagnostics from this hospitalization (including imaging, microbiology, ancillary and laboratory) are listed below for reference.     Microbiology: Recent Results (from the past 240 hour(s))  SARS Coronavirus 2 (CEPHEID - Performed in [facility] hospital lab), Hosp Order     Status: None   Collection Time: 01/09/19  6:27 PM  Result Value Ref Range Status   SARS Coronavirus 2 NEGATIVE NEGATIVE Final    Comment: (NOTE) If result is NEGATIVE SARS-CoV-2 target nucleic acids are NOT  DETECTED. The SARS-CoV-2 RNA is generally detectable in upper and lower  respiratory specimens during the acute phase of infection. The lowest  concentration of SARS-CoV-2 viral copies this assay can detect is 250  copies / mL. A negative result does not preclude SARS-CoV-2 infection  and should not be used as the sole basis for treatment or other  patient management decisions.  A negative result may occur with  improper specimen collection / handling, submission of specimen other  than nasopharyngeal swab, presence of viral mutation(s) within the  areas targeted by this assay, and inadequate number of viral copies  (<250 copies / mL). A negative result must be combined with clinical  observations, patient history, and epidemiological information. If result is  POSITIVE SARS-CoV-2 target nucleic acids are DETECTED. The SARS-CoV-2 RNA is generally detectable in upper and lower  respiratory specimens dur ing the acute phase of infection.  Positive  results are indicative of active infection with SARS-CoV-2.  Clinical  correlation with patient history and other diagnostic information is  necessary to determine patient infection status.  Positive results do  not rule out bacterial infection or co-infection with other viruses. If result is PRESUMPTIVE POSTIVE SARS-CoV-2 nucleic acids MAY BE PRESENT.   A presumptive positive result was obtained on the submitted specimen  and confirmed on repeat testing.  While 2019 novel coronavirus  (SARS-CoV-2) nucleic acids may be present in the submitted sample  additional confirmatory testing may be necessary for epidemiological  and / or clinical management purposes  to differentiate between  SARS-CoV-2 and other Sarbecovirus currently known to infect humans.  If clinically indicated additional testing with an alternate test  methodology [phone]) is advised. The SARS-CoV-2 RNA is generally  detectable in upper and lower respiratory sp ecimens during  the acute  phase of infection. The expected result is Negative. Fact Sheet for Patients:  BoilerBrush.com.cy Fact Sheet for Healthcare Providers: https://pope.com/ This test is not yet approved or cleared by the [place] FDA and has been authorized for detection and/or diagnosis of SARS-CoV-2 by FDA under an Emergency Use Authorization (EUA).  This EUA will remain in effect (meaning this test can be used) for the duration of the COVID-19 declaration under Section 564(b)(1) of the Act, 21 U.S.C. section 360bbb-3(b)(1), unless the authorization is terminated or revoked sooner. Performed at [facility], [address], [place], [place] [address]      Labs: BNP (last 3 results) No results for input(s): BNP in the last 8760 hours. Basic Metabolic Panel: Recent Labs  Lab 01/09/19 1514 01/09/19 1602 01/09/19 2000 01/11/19 0542 01/13/19 0624  NA 142  --   --  140 140  K 4.0  --   --  3.6 3.6  CL 107  --   --  108 108  CO2 21*  --   --  24 25  GLUCOSE 72  --   --  90 100*  BUN 5*  --   --  6 <5*  CREATININE 0.64  --   --  0.56 0.52  CALCIUM 9.1  --   --  8.4* 9.0  MG  --  2.0  --   --   --   PHOS  --   --  4.1  --   --    Liver Function Tests: Recent Labs  Lab 01/09/19 1514  AST 99*  ALT 85*  ALKPHOS 126  BILITOT 1.2  PROT 8.0  ALBUMIN 4.1   Recent Labs  Lab 01/09/19 2000  LIPASE 53*   Recent Labs  Lab 01/10/19 0204  AMMONIA 35   CBC: Recent Labs  Lab 01/09/19 1512 01/10/19 0204 01/11/19 0542 01/13/19 0624 01/14/19 0629 01/15/19 0614  WBC 12.2* 8.1 5.8 6.7 6.8 6.3  NEUTROABS 10.1*  --  3.7 4.7  --   --   HGB 11.5* 10.9* 8.5* 9.5* 9.7* 9.8*  HCT 36.2 33.8* 26.1* 29.4* 31.1* 32.2*  MCV 102.8* 106.0* 103.6* 105.0* 105.4* 105.9*  PLT 373 285 218 226 215 206   Cardiac Enzymes: Recent Labs  Lab 01/09/19 2000 01/10/19 0204 01/10/19 0813  CKTOTAL 51  --   --   TROPONINI <0.03  <0.03 <0.03   BNP: Invalid input(s): POCBNP CBG: Recent Labs  Lab 01/09/19 1521  GLUCAP 73   D-Dimer No results for input(s): DDIMER in the last 72 hours. Hgb A1c Recent Labs    01/13/19 0624  HGBA1C 4.5*   Lipid Profile No results for input(s): CHOL, HDL, LDLCALC, TRIG, CHOLHDL, LDLDIRECT in the last 72 hours. Thyroid function studies No results for input(s): TSH, T4TOTAL, T3FREE, THYROIDAB in the last 72 hours.  Invalid input(s): FREET3 Anemia work up No results for input(s): VITAMINB12, FOLATE, FERRITIN, TIBC, IRON, RETICCTPCT in the last 72 hours. Urinalysis    Component Value Date/Time   COLORURINE YELLOW 01/09/2019 1735   APPEARANCEUR HAZY (A) 01/09/2019 1735   LABSPEC 1.019 01/09/2019 1735   PHURINE 5.0 01/09/2019 1735   GLUCOSEU NEGATIVE 01/09/2019 1735   HGBUR NEGATIVE 01/09/2019 1735   BILIRUBINUR NEGATIVE 01/09/2019 1735   KETONESUR NEGATIVE 01/09/2019 1735   PROTEINUR NEGATIVE 01/09/2019 1735   UROBILINOGEN 0.2 09/20/2008 1437   NITRITE NEGATIVE 01/09/2019 1735   LEUKOCYTESUR NEGATIVE 01/09/2019 1735   Sepsis Labs Invalid input(s): PROCALCITONIN,  WBC,  LACTICIDVEN Microbiology Recent Results (from the past 240 hour(s))  SARS Coronavirus 2 (CEPHEID - Performed in [facility] hospital lab), Hosp Order     Status: None   Collection Time: 01/09/19  6:27 PM  Result Value Ref Range Status   SARS Coronavirus 2 NEGATIVE NEGATIVE Final    Comment: (NOTE) If result is NEGATIVE SARS-CoV-2 target nucleic acids are NOT DETECTED. The SARS-CoV-2 RNA is generally detectable in upper and lower  respiratory specimens during the acute phase of infection. The lowest  concentration of SARS-CoV-2 viral copies this assay can detect is 250  copies / mL. A negative result does not preclude SARS-CoV-2 infection  and should not be used as the sole basis for treatment or other  patient management decisions.  A negative result may occur with  improper specimen collection /  handling, submission of specimen other  than nasopharyngeal swab, presence of viral mutation(s) within the  areas targeted by this assay, and inadequate number of viral copies  (<250 copies / mL). A negative result must be combined with clinical  observations, patient history, and epidemiological information. If result is POSITIVE SARS-CoV-2 target nucleic acids are DETECTED. The SARS-CoV-2 RNA is generally detectable in upper and lower  respiratory specimens dur ing the acute phase of infection.  Positive  results are indicative of active infection with SARS-CoV-2.  Clinical  correlation with patient history and other diagnostic information is  necessary to determine patient infection status.  Positive results do  not rule out bacterial infection or co-infection with other viruses. If result is PRESUMPTIVE POSTIVE SARS-CoV-2 nucleic acids MAY BE PRESENT.   A presumptive positive result was obtained on the submitted specimen  and confirmed on repeat testing.  While 2019 novel coronavirus  (SARS-CoV-2) nucleic acids may be present in the submitted sample  additional confirmatory testing may be necessary for epidemiological  and / or clinical management purposes  to differentiate between  SARS-CoV-2 and other Sarbecovirus currently known to infect humans.  If clinically indicated additional testing with an alternate test  methodology [phone]) is advised. The SARS-CoV-2 RNA is generally  detectable in upper and lower respiratory sp ecimens during the acute  phase of infection. The expected result is Negative. Fact Sheet for Patients:  BoilerBrush.com.cy Fact Sheet for Healthcare Providers: https://pope.com/ This test is not yet approved or cleared by the [place] FDA and has been authorized for detection and/or diagnosis of SARS-CoV-2 by FDA under an Emergency Use  Authorization (EUA).  This EUA will remain in effect (meaning this  test can be used) for the duration of the COVID-19 declaration under Section 564(b)(1) of the Act, 21 U.S.C. section 360bbb-3(b)(1), unless the authorization is terminated or revoked sooner. Performed at [facility], [address], [place], [place] [address]      Time coordinating discharge: 40 minutes  SIGNED:   Hughie Closs, MD  [facility] 01/15/2019, 10:10 AM Pager [phone]  If 7PM-7AM, please contact night-coverage [email or web address] Password [email or web address]

## 2019-01-15 NOTE — Progress Notes (Signed)
ANTICOAGULATION CONSULT NOTE  Pharmacy Consult for Heparin/Lovenox, warfarin - D4 overlap Indication: pulmonary embolus  Allergies  Allergen Reactions  . Ibuprofen     Gastric bypass surgery and history of gi bleed.    Patient Measurements: Height: 5\' 2"  (157.5 cm) Weight: 191 lb 12.8 oz (87 kg) IBW/kg (Calculated) : 50.1 Heparin Dosing Weight: 70 kg  Vital Signs: Temp: 98.5 F (36.9 C) (05/29 0407) Temp Source: Oral (05/29 0407) BP: 138/101 (05/29 0602) Pulse Rate: 102 (05/29 0602)  Labs: Recent Labs    01/13/19 0624 01/14/19 0629 01/15/19 0614  HGB 9.5* 9.7* 9.8*  HCT 29.4* 31.1* 32.2*  PLT 226 215 206  LABPROT 13.0 14.6 14.7  INR 1.0 1.2 1.2  HEPARINUNFRC 0.29* 0.46  --   CREATININE 0.52  --   --     Estimated Creatinine Clearance: 97.7 mL/min (by C-G formula based on SCr of 0.52 mg/dL).   Medical History: Past Medical History:  Diagnosis Date  . Neuropathy 01/09/2019    Medications:  Infusions:    Assessment: Patient with small segmental PE. Dopplers neg for DVT.  Baseline coags WNL.  Heparin per pharmacy for PE.   01/15/2019  IV Heparin changed to full dose Lovenox 5/28 PM - 85mg  SQ q12  No bleeding reported  Hgb stable  Plts stable  INR subtherapeutic s/p 4 doses of warfarin and not really responding despite increased doses of 10mg  last 2 days  Goal of Therapy:  Heparin level 0.3-0.7 units/ml Monitor platelets by anticoagulation protocol: Yes   Plan:  1) Continue current Lovenox dosing 2) Daily CBC while on Lovenox 3) Today will be overlap D5 of min 5 of warfarin/heparin and now will continue overlap until INR therapeutic x 2 days 4) Boost warfarin to 15mg  today at 1200 NOON 5) warfarin education completed today 5/28, paperwork provided 6) Note that DOAC's not recommended s/p gastric bypass   Hessie Knows, PharmD, BCPS 01/15/2019 9:28 AM

## 2019-01-15 NOTE — Progress Notes (Signed)
Pt continues to pull at tele leads. Pt refusing to allow RN to replace. HR has been NSR- ST in low 100s w/ ambulation. Will continue to monitor.

## 2019-01-15 NOTE — Progress Notes (Signed)
Patient has received discharge instructions with no immediate questions or concerns. The patient will be taken down stairs via wheelchair when leaving after discharged.

## 2019-02-05 ENCOUNTER — Other Ambulatory Visit: Payer: Self-pay | Admitting: Internal Medicine

## 2019-02-05 ENCOUNTER — Other Ambulatory Visit: Payer: Medicaid Other

## 2019-02-05 DIAGNOSIS — Z20822 Contact with and (suspected) exposure to covid-19: Secondary | ICD-10-CM

## 2019-02-07 LAB — NOVEL CORONAVIRUS, NAA: SARS-CoV-2, NAA: NOT DETECTED

## 2019-06-02 ENCOUNTER — Inpatient Hospital Stay (HOSPITAL_COMMUNITY)
Admission: EM | Admit: 2019-06-02 | Discharge: 2019-06-03 | DRG: 813 | Disposition: A | Discharge status: Home / Self Care | Payer: Medicaid Other | Attending: Internal Medicine | Admitting: Internal Medicine

## 2019-06-02 ENCOUNTER — Other Ambulatory Visit: Payer: Self-pay

## 2019-06-02 ENCOUNTER — Encounter (HOSPITAL_COMMUNITY): Payer: Self-pay | Admitting: Emergency Medicine

## 2019-06-02 DIAGNOSIS — Z793 Long term (current) use of hormonal contraceptives: Secondary | ICD-10-CM

## 2019-06-02 DIAGNOSIS — E538 Deficiency of other specified B group vitamins: Secondary | ICD-10-CM | POA: Diagnosis present

## 2019-06-02 DIAGNOSIS — F17210 Nicotine dependence, cigarettes, uncomplicated: Secondary | ICD-10-CM | POA: Diagnosis present

## 2019-06-02 DIAGNOSIS — R791 Abnormal coagulation profile: Secondary | ICD-10-CM | POA: Diagnosis not present

## 2019-06-02 DIAGNOSIS — R000 Tachycardia, unspecified: Secondary | ICD-10-CM | POA: Diagnosis present

## 2019-06-02 DIAGNOSIS — Z888 Allergy status to other drugs, medicaments and biological substances status: Secondary | ICD-10-CM | POA: Diagnosis not present

## 2019-06-02 DIAGNOSIS — Z86711 Personal history of pulmonary embolism: Secondary | ICD-10-CM | POA: Diagnosis not present

## 2019-06-02 DIAGNOSIS — Z20828 Contact with and (suspected) exposure to other viral communicable diseases: Secondary | ICD-10-CM | POA: Diagnosis present

## 2019-06-02 DIAGNOSIS — Z886 Allergy status to analgesic agent status: Secondary | ICD-10-CM

## 2019-06-02 DIAGNOSIS — Z9884 Bariatric surgery status: Secondary | ICD-10-CM

## 2019-06-02 DIAGNOSIS — I10 Essential (primary) hypertension: Secondary | ICD-10-CM | POA: Diagnosis present

## 2019-06-02 DIAGNOSIS — F1010 Alcohol abuse, uncomplicated: Secondary | ICD-10-CM

## 2019-06-02 DIAGNOSIS — Z23 Encounter for immunization: Secondary | ICD-10-CM

## 2019-06-02 DIAGNOSIS — E669 Obesity, unspecified: Secondary | ICD-10-CM | POA: Diagnosis present

## 2019-06-02 DIAGNOSIS — Z6834 Body mass index (BMI) 34.0-34.9, adult: Secondary | ICD-10-CM

## 2019-06-02 DIAGNOSIS — Z8249 Family history of ischemic heart disease and other diseases of the circulatory system: Secondary | ICD-10-CM | POA: Diagnosis not present

## 2019-06-02 DIAGNOSIS — K760 Fatty (change of) liver, not elsewhere classified: Secondary | ICD-10-CM | POA: Diagnosis present

## 2019-06-02 DIAGNOSIS — Z7901 Long term (current) use of anticoagulants: Secondary | ICD-10-CM | POA: Diagnosis not present

## 2019-06-02 DIAGNOSIS — D6832 Hemorrhagic disorder due to extrinsic circulating anticoagulants: Secondary | ICD-10-CM | POA: Diagnosis present

## 2019-06-02 DIAGNOSIS — G629 Polyneuropathy, unspecified: Secondary | ICD-10-CM | POA: Diagnosis present

## 2019-06-02 DIAGNOSIS — I2699 Other pulmonary embolism without acute cor pulmonale: Secondary | ICD-10-CM | POA: Diagnosis not present

## 2019-06-02 DIAGNOSIS — F1020 Alcohol dependence, uncomplicated: Secondary | ICD-10-CM | POA: Diagnosis present

## 2019-06-02 DIAGNOSIS — Z79899 Other long term (current) drug therapy: Secondary | ICD-10-CM

## 2019-06-02 DIAGNOSIS — Z9089 Acquired absence of other organs: Secondary | ICD-10-CM | POA: Diagnosis not present

## 2019-06-02 DIAGNOSIS — T45515A Adverse effect of anticoagulants, initial encounter: Secondary | ICD-10-CM | POA: Diagnosis present

## 2019-06-02 DIAGNOSIS — T148XXA Other injury of unspecified body region, initial encounter: Secondary | ICD-10-CM

## 2019-06-02 HISTORY — DX: Other pulmonary embolism without acute cor pulmonale: I26.99

## 2019-06-02 LAB — URINALYSIS, ROUTINE W REFLEX MICROSCOPIC
Bacteria, UA: NONE SEEN
Bilirubin Urine: NEGATIVE
Glucose, UA: NEGATIVE mg/dL
Ketones, ur: 5 mg/dL — AB
Leukocytes,Ua: NEGATIVE
Nitrite: NEGATIVE
Protein, ur: NEGATIVE mg/dL
Specific Gravity, Urine: 1.024 (ref 1.005–1.030)
pH: 5 (ref 5.0–8.0)

## 2019-06-02 LAB — COMPREHENSIVE METABOLIC PANEL
ALT: 17 U/L (ref 0–44)
AST: 22 U/L (ref 15–41)
Albumin: 3.9 g/dL (ref 3.5–5.0)
Alkaline Phosphatase: 68 U/L (ref 38–126)
Anion gap: 10 (ref 5–15)
BUN: 10 mg/dL (ref 6–20)
CO2: 23 mmol/L (ref 22–32)
Calcium: 8.8 mg/dL — ABNORMAL LOW (ref 8.9–10.3)
Chloride: 103 mmol/L (ref 98–111)
Creatinine, Ser: 0.56 mg/dL (ref 0.44–1.00)
GFR calc Af Amer: >60 mL/min (ref >60)
GFR calc non Af Amer: >60 mL/min (ref >60)
Glucose, Bld: 94 mg/dL (ref 70–99)
Potassium: 4.2 mmol/L (ref 3.5–5.1)
Sodium: 136 mmol/L (ref 135–145)
Total Bilirubin: 1.4 mg/dL — ABNORMAL HIGH (ref 0.3–1.2)
Total Protein: 7.6 g/dL (ref 6.5–8.1)

## 2019-06-02 LAB — CBC WITH DIFFERENTIAL/PLATELET
Abs Immature Granulocytes: 0.03 10*3/uL (ref 0.00–0.07)
Basophils Absolute: 0 10*3/uL (ref 0.0–0.1)
Basophils Relative: 0 %
Eosinophils Absolute: 0.1 10*3/uL (ref 0.0–0.5)
Eosinophils Relative: 1 %
HCT: 34.9 % — ABNORMAL LOW (ref 36.0–46.0)
Hemoglobin: 10.6 g/dL — ABNORMAL LOW (ref 12.0–15.0)
Immature Granulocytes: 0 %
Lymphocytes Relative: 16 %
Lymphs Abs: 1.4 10*3/uL (ref 0.7–4.0)
MCH: 28.3 pg (ref 26.0–34.0)
MCHC: 30.4 g/dL (ref 30.0–36.0)
MCV: 93.1 fL (ref 80.0–100.0)
Monocytes Absolute: 0.6 10*3/uL (ref 0.1–1.0)
Monocytes Relative: 7 %
Neutro Abs: 6.4 10*3/uL (ref 1.7–7.7)
Neutrophils Relative %: 76 %
Platelets: 228 10*3/uL (ref 150–400)
RBC: 3.75 MIL/uL — ABNORMAL LOW (ref 3.87–5.11)
RDW: 15.4 % (ref 11.5–15.5)
WBC: 8.5 10*3/uL (ref 4.0–10.5)
nRBC: 0 % (ref 0.0–0.2)

## 2019-06-02 LAB — VITAMIN B12: Vitamin B-12: 162 pg/mL — ABNORMAL LOW (ref 180–914)

## 2019-06-02 LAB — FOLATE: Folate: 6 ng/mL (ref >5.9)

## 2019-06-02 LAB — RAPID URINE DRUG SCREEN, HOSP PERFORMED
Amphetamines: NOT DETECTED
Barbiturates: NOT DETECTED
Benzodiazepines: NOT DETECTED
Cocaine: NOT DETECTED
Opiates: POSITIVE — AB
Tetrahydrocannabinol: NOT DETECTED

## 2019-06-02 LAB — D-DIMER, QUANTITATIVE: D-Dimer, Quant: 0.57 ug/mL-FEU — ABNORMAL HIGH (ref 0.00–0.50)

## 2019-06-02 LAB — ETHANOL: Alcohol, Ethyl (B): <10 mg/dL (ref <10)

## 2019-06-02 LAB — PROTIME-INR
INR: >10 (ref 0.8–1.2)
Prothrombin Time: >90 seconds — ABNORMAL HIGH (ref 11.4–15.2)

## 2019-06-02 LAB — TYPE AND SCREEN
ABO/RH(D): O POS
Antibody Screen: NEGATIVE

## 2019-06-02 LAB — HCG, SERUM, QUALITATIVE: Preg, Serum: NEGATIVE

## 2019-06-02 LAB — POC OCCULT BLOOD, ED: Fecal Occult Bld: NEGATIVE

## 2019-06-02 MED ORDER — HYDROCODONE-ACETAMINOPHEN 5-325 MG PO TABS
1.0000 | ORAL_TABLET | Freq: Four times a day (QID) | ORAL | Status: DC | PRN
  Administered 2019-06-03 (×2): 2 via ORAL
  Filled 2019-06-02 (×2): qty 2

## 2019-06-02 MED ORDER — INFLUENZA VAC SPLIT QUAD 0.5 ML IM SUSY
0.5000 mL | PREFILLED_SYRINGE | INTRAMUSCULAR | Status: AC
  Administered 2019-06-03: 0.5 mL via INTRAMUSCULAR
  Filled 2019-06-02: qty 0.5

## 2019-06-02 MED ORDER — LORAZEPAM 1 MG PO TABS
1.0000 mg | ORAL_TABLET | ORAL | Status: DC | PRN
  Administered 2019-06-02: 20:00:00 1 mg via ORAL
  Filled 2019-06-02: qty 1

## 2019-06-02 MED ORDER — PHYTONADIONE 5 MG PO TABS
5.0000 mg | ORAL_TABLET | Freq: Once | ORAL | Status: AC
  Administered 2019-06-02: 5 mg via ORAL
  Filled 2019-06-02: qty 1

## 2019-06-02 MED ORDER — PNEUMOCOCCAL VAC POLYVALENT 25 MCG/0.5ML IJ INJ
0.5000 mL | INJECTION | INTRAMUSCULAR | Status: AC
  Administered 2019-06-03: 0.5 mL via INTRAMUSCULAR
  Filled 2019-06-02: qty 0.5

## 2019-06-02 MED ORDER — VITAMIN B-12 1000 MCG PO TABS
1000.0000 ug | ORAL_TABLET | Freq: Every day | ORAL | Status: DC
  Administered 2019-06-03: 11:00:00 1000 ug via ORAL
  Filled 2019-06-02 (×4): qty 1

## 2019-06-02 MED ORDER — THIAMINE HCL 100 MG/ML IJ SOLN
Freq: Once | INTRAVENOUS | Status: AC
  Administered 2019-06-02: 12:00:00 via INTRAVENOUS
  Filled 2019-06-02: qty 1000

## 2019-06-02 MED ORDER — MORPHINE SULFATE (PF) 2 MG/ML IV SOLN
2.0000 mg | Freq: Once | INTRAVENOUS | Status: AC
  Administered 2019-06-02: 2 mg via INTRAVENOUS
  Filled 2019-06-02: qty 1

## 2019-06-02 MED ORDER — HYDROCODONE-ACETAMINOPHEN 5-325 MG PO TABS
2.0000 | ORAL_TABLET | Freq: Once | ORAL | Status: AC
  Administered 2019-06-02: 2 via ORAL
  Filled 2019-06-02: qty 2

## 2019-06-02 MED ORDER — LORAZEPAM 2 MG/ML IJ SOLN: 1.0000 mg | INTRAMUSCULAR | Status: DC | PRN

## 2019-06-02 MED ORDER — THIAMINE HCL 100 MG/ML IJ SOLN
100.0000 mg | Freq: Every day | INTRAMUSCULAR | Status: DC
  Filled 2019-06-02: qty 2

## 2019-06-02 MED ORDER — FOLIC ACID 1 MG PO TABS
1.0000 mg | ORAL_TABLET | Freq: Every day | ORAL | Status: DC
  Administered 2019-06-02 – 2019-06-03 (×2): 1 mg via ORAL
  Filled 2019-06-02 (×2): qty 1

## 2019-06-02 MED ORDER — METOPROLOL TARTRATE 25 MG PO TABS
12.5000 mg | ORAL_TABLET | Freq: Two times a day (BID) | ORAL | Status: DC
  Administered 2019-06-02 – 2019-06-03 (×2): 12.5 mg via ORAL
  Filled 2019-06-02 (×3): qty 1

## 2019-06-02 MED ORDER — VITAMIN B-1 100 MG PO TABS
100.0000 mg | ORAL_TABLET | Freq: Every day | ORAL | Status: DC
  Administered 2019-06-02 – 2019-06-03 (×2): 100 mg via ORAL
  Filled 2019-06-02 (×2): qty 1

## 2019-06-02 MED ORDER — SODIUM CHLORIDE 0.9 % IV BOLUS
1000.0000 mL | Freq: Once | INTRAVENOUS | Status: AC
  Administered 2019-06-02: 16:00:00 1000 mL via INTRAVENOUS

## 2019-06-02 MED ORDER — ADULT MULTIVITAMIN W/MINERALS CH
1.0000 | ORAL_TABLET | Freq: Every day | ORAL | Status: DC
  Administered 2019-06-02 – 2019-06-03 (×2): 1 via ORAL
  Filled 2019-06-02 (×2): qty 1

## 2019-06-02 MED ORDER — GABAPENTIN 300 MG PO CAPS
300.0000 mg | ORAL_CAPSULE | Freq: Three times a day (TID) | ORAL | Status: DC
  Administered 2019-06-02 – 2019-06-03 (×2): 300 mg via ORAL
  Filled 2019-06-02 (×2): qty 1

## 2019-06-02 MED ORDER — SODIUM CHLORIDE 0.9% IV SOLUTION
Freq: Once | INTRAVENOUS | Status: AC
  Administered 2019-06-02: 17:00:00 via INTRAVENOUS

## 2019-06-02 NOTE — ED Notes (Signed)
No reaction noted 15 mins post transfusion start.

## 2019-06-02 NOTE — ED Notes (Signed)
Date and time results received: 06/02/19 2:08 PM  (use smartphrase ".now" to insert current time)  Test: INR Critical Value: >10  Name of Provider Notified: brandon morelli  Orders Received? Or Actions Taken?: n/a

## 2019-06-02 NOTE — ED Provider Notes (Signed)
[facility] EMERGENCY DEPARTMENT Provider Note   CSN: [id number] Arrival date & time: 06/02/19  1047     History   Chief Complaint Chief Complaint  Patient presents with  . Bleeding/Bruising    HPI Stacy [name] is a 38 y.o. female with history of pulmonary embolism on Coumadin, alcohol abuse with withdrawal, gastric bypass presents today for bruising.  Patient reports over the last 1-2 weeks she has had multiple large bruises appear all over her body.  She denies any injury and reports these are spontaneous.  She describes a mild throbbing pain to her larger and newer bruises constant without aggravating or alleviating factors and nonradiating.  Of note patient reports that she has not had her INR checked in greater than 1 month.  Denies fever/chills, headache/vision changes, chest pain/shortness of breath, abdominal pain, nausea/vomiting, diarrhea, dysuria/hematuria, fall/injury or any additional concerns today.     HPI  Past Medical History:  Diagnosis Date  . Neuropathy 01/09/2019  . Pulmonary embolism [facility])     Patient Active Problem List   Diagnosis Date Noted  . Supratherapeutic INR 06/02/2019  . Leukocytosis 01/09/2019  . Alcohol abuse 01/09/2019  . Elevated LFTs 01/09/2019  . Hepatic steatosis 01/09/2019  . Alcohol dependence with uncomplicated withdrawal ([place]) 01/09/2019  . Alcohol withdrawal ([place]) 01/09/2019  . Vitamin D deficiency 01/09/2019  . Folic acid deficiency 61/95/0932  . Non-sustained ventricular tachycardia ([place]) 01/09/2019  . Lactic acidosis 01/09/2019  . Pulmonary embolism ([place]) 01/09/2019  . Acute metabolic encephalopathy 67/07/4579  . Closed fracture of distal end of left humerus 04/29/18 05/06/2018    Past Surgical History:  Procedure Laterality Date  . GASTRIC BYPASS    . TONSILLECTOMY       OB History   No obstetric history on file.      Home Medications    Prior to Admission medications   Medication Sig Start Date End  Date Taking? Authorizing Provider  acetaminophen (TYLENOL) 500 MG tablet Take 1,000 mg by mouth every 6 (six) hours as needed for mild pain or moderate pain.    Yes [provider]  amLODipine (NORVASC) 10 MG tablet Take 1 tablet (10 mg total) by mouth daily for 30 days. 01/15/19 06/02/19 Yes Pahwani, Einar Grad, MD  gabapentin (NEURONTIN) 300 MG capsule Take 1 capsule (300 mg total) by mouth 3 (three) times daily for 30 days. 01/15/19 06/02/19 Yes Pahwani, Einar Grad, MD  metoprolol tartrate (LOPRESSOR) 25 MG tablet Take 0.5 tablets (12.5 mg total) by mouth 2 (two) times daily for 30 days. 01/15/19 06/02/19 Yes Pahwani, Einar Grad, MD  Multiple Vitamin (MULTIVITAMIN WITH MINERALS) TABS tablet Take 1 tablet by mouth daily.   Yes [provider]  warfarin (COUMADIN) 7.5 MG tablet Take 2 tablets (15 mg total) by mouth daily for 30 days. 01/15/19 06/02/19 Yes Pahwani, Einar Grad, MD  enoxaparin (LOVENOX) 100 MG/ML injection Inject 0.85 mLs (85 mg total) into the skin every 12 (twelve) hours for 5 days. 01/15/19 01/20/19  Darliss Cheney, MD  Guaifenesin 1200 MG TB12 Take 1 tablet (1,200 mg total) by mouth 2 (two) times daily. Patient not taking: Reported on 01/06/2019 11/28/18   Dalia Heading, PA-C  HYDROcodone-acetaminophen (NORCO/VICODIN) 5-325 MG tablet Take 2 tablets by mouth every 4 (four) hours as needed. Patient not taking: Reported on 11/10/2018 10/23/18   Fredia Sorrow, MD  methocarbamol (ROBAXIN) 500 MG tablet Take 1 tablet (500 mg total) by mouth 2 (two) times daily. Patient not taking: Reported on 01/06/2019 11/10/18   Okey Regal,  PA-C  ondansetron (ZOFRAN ODT) 4 MG disintegrating tablet Take 1 tablet (4 mg total) by mouth every 8 (eight) hours as needed for nausea or vomiting. Patient not taking: Reported on 01/09/2019 01/06/19   Horton, Mayer Masker, MD  ondansetron (ZOFRAN) 4 MG tablet Take 1 tablet (4 mg total) by mouth every 6 (six) hours. Patient not taking: Reported on 01/06/2019 12/22/18   Eyvonne Mechanic, PA-C  oxyCODONE-acetaminophen (PERCOCET/ROXICET) 5-325 MG tablet Take 1 tablet by mouth every 6 (six) hours as needed for severe pain. Patient not taking: Reported on 01/06/2019 12/22/18   Eyvonne Mechanic, PA-C  promethazine-dextromethorphan (PROMETHAZINE-DM) 6.25-15 MG/5ML syrup Take 5 mLs by mouth 4 (four) times daily as needed for cough. Patient not taking: Reported on 01/06/2019 11/28/18   Charlestine Night, PA-C    Family History Family History  Problem Relation Age of Onset  . Chronic infections Mother   . Healthy Father   . High blood pressure Maternal Grandfather   . Diabetes Maternal Grandfather   . Diabetes Paternal Grandmother   . Cancer Paternal Grandmother     Social History Social History   Tobacco Use  . Smoking status: Current Some Day Smoker    Packs/day: 0.50    Types: Cigarettes  . Smokeless tobacco: Never Used  . Tobacco comment: 4 cigarettes daily when she does smoke  Substance Use Topics  . Alcohol use: Yes    Comment: occ  . Drug use: No     Allergies   Lovenox  [enoxaparin sodium] and Ibuprofen   Review of Systems Review of Systems Ten systems are reviewed and are negative for acute change except as noted in the HPI   Physical Exam Updated Vital Signs BP 121/86 (BP Location: Right Arm)   Pulse (!) 134   Temp 98.3 F (36.8 C) (Oral)   Resp 18   Ht  (1.575 m)   Wt 84.8 kg   SpO2 100%   BMI 34.20 kg/m   Physical Exam Constitutional:      General: She is not in acute distress.    Appearance: Normal appearance. She is well-developed. She is obese. She is not ill-appearing or diaphoretic.  HENT:     Head: Normocephalic and atraumatic.     Right Ear: External ear normal.     Left Ear: External ear normal.     Nose: Nose normal.  Eyes:     General: Vision grossly intact. Gaze aligned appropriately.     Pupils: Pupils are equal, round, and reactive to light.  Neck:     Musculoskeletal: Normal range of motion.     Trachea:  Trachea and phonation normal. No tracheal deviation.  Cardiovascular:     Rate and Rhythm: Regular rhythm. Tachycardia present.     Pulses: Normal pulses.     Heart sounds: Normal heart sounds.  Pulmonary:     Effort: Pulmonary effort is normal. No respiratory distress.     Breath sounds: Normal breath sounds.  Abdominal:     General: There is no distension.     Palpations: Abdomen is soft.     Tenderness: There is no abdominal tenderness. There is no guarding or rebound.  Genitourinary:    Comments: Rectal examination chaperoned by Jimmey Ralph nurse tech.  Normal rectal tone.  No gross blood.  No external hemorrhoids, no palpable internal hemorrhoids or fissures.  Stool light brown. Musculoskeletal: Normal range of motion.  Skin:    General: Skin is warm and dry.     Findings:  Bruising (Multiple large up to 8-10 cm diameter of various ages) present.  Neurological:     Mental Status: She is alert.     GCS: GCS eye subscore is 4. GCS verbal subscore is 5. GCS motor subscore is 6.     Comments: Speech is clear and goal oriented, follows commands Major Cranial nerves without deficit, no facial droop Moves extremities without ataxia, coordination intact  Psychiatric:        Behavior: Behavior normal.      ED Treatments / Results  Labs (all labs ordered are listed, but only abnormal results are displayed) Labs Reviewed  CBC WITH DIFFERENTIAL/PLATELET - Abnormal; Notable for the following components:      Result Value   RBC 3.75 (*)    Hemoglobin 10.6 (*)    HCT 34.9 (*)    All other components within normal limits  COMPREHENSIVE METABOLIC PANEL - Abnormal; Notable for the following components:   Calcium 8.8 (*)    Total Bilirubin 1.4 (*)    All other components within normal limits  PROTIME-INR - Abnormal; Notable for the following components:   Prothrombin Time >90.0 (*)    INR >10.0 (*)    All other components within normal limits  VITAMIN B12 - Abnormal; Notable for the  following components:   Vitamin B-12 162 (*)    All other components within normal limits  URINALYSIS, ROUTINE W REFLEX MICROSCOPIC - Abnormal; Notable for the following components:   APPearance HAZY (*)    Hgb urine dipstick SMALL (*)    Ketones, ur 5 (*)    All other components within normal limits  RAPID URINE DRUG SCREEN, HOSP PERFORMED - Abnormal; Notable for the following components:   Opiates POSITIVE (*)    All other components within normal limits  SARS CORONAVIRUS 2 (TAT 6-24 HRS)  ETHANOL  FOLATE  HCG, SERUM, QUALITATIVE  POC OCCULT BLOOD, ED  I-STAT BETA HCG BLOOD, ED ([facility], [facility], [facility] ONLY)  TYPE AND SCREEN  PREPARE FRESH FROZEN PLASMA    EKG EKG Interpretation  Date/Time:  Wednesday June 02 2019 13:51:49 EDT Ventricular Rate:  108 PR Interval:    QRS Duration: 77 QT Interval:  336 QTC Calculation: 451 R Axis:   74 Text Interpretation:  Sinus tachycardia Since last tracing rate faster Confirmed by Eber HongMiller, Brian ([id number]) on 06/02/2019 1:55:21 PM   Radiology No results found.  Procedures Procedures (including critical care time)  Medications Ordered in ED Medications  0.9 %  sodium chloride infusion (Manually program via Guardrails IV Fluids) (has no administration in time range)  LORazepam (ATIVAN) tablet 1-4 mg (has no administration in time range)    Or  LORazepam (ATIVAN) injection 1-4 mg (has no administration in time range)  thiamine (VITAMIN B-1) tablet 100 mg (has no administration in time range)    Or  thiamine (B-1) injection 100 mg (has no administration in time range)  folic acid (FOLVITE) tablet 1 mg (has no administration in time range)  multivitamin with minerals tablet 1 tablet (has no administration in time range)  sodium chloride 0.9 % 1,000 mL with thiamine 100 mg, folic acid 1 mg, multivitamins adult 10 mL infusion ( Intravenous New Bag/Given 06/02/19 1226)  morphine 2 MG/ML injection 2 mg (2 mg Intravenous Given 06/02/19 1247)   phytonadione (VITAMIN K) tablet 5 mg (5 mg Oral Given 06/02/19 1513)  sodium chloride 0.9 % bolus 1,000 mL (1,000 mLs Intravenous New Bag/Given 06/02/19 1534)     Initial Impression /  Assessment and Plan / ED Course  I have reviewed the triage vital signs and the nursing notes.  Pertinent labs & imaging results that were available during my care of the patient were reviewed by me and considered in my medical decision making (see chart for details).  Clinical Course as of Jun 01 1605  Wed Jun 02, 2019  1411 Eosinophils Absolute: 0.1 [BM]    Clinical Course User Index [BM] Eber Hong, MD   Type and screen O+, antibody negative B12 162 Folate 6.0 Ethanol negative CBC nonacute, hemoglobin 10.6 CMP nonacute Prothrombin time greater than 90.0 INR greater than 10.0 EKG: Sinus tachycardia Since last tracing rate faster Confirmed by Eber Hong ([id number]) on 06/02/2019 1:55:21 PM  Hemoccult negative - Pharmacy consulted for vitamin K, advise 10 mg p.o. - Patient reassessed resting comfortably no acute distress.  Remains tachycardic 100 bpm.  Denies any chest pain or shortness of breath. - Patient ambulated to bathroom without assistance on return noted to be tachycardic up to 135 bpm.  She has been given banana bag so far, additional fluid bolus ordered.  We will seek admission at this time for follow-up on INR, tachycardia and monitoring.  Patient is resting calmly in room and does not appear to be in alcohol withdrawal at this time. - Discussed case with hospitalist service will be seeing patient for admission. - Patient has been admitted to hospital service for further evaluation management.  Patient was seen and evaluated by Dr. Hyacinth Meeker during this visit.  Note: Portions of this report may have been transcribed using voice recognition software. Every effort was made to ensure accuracy; however, inadvertent computerized transcription errors may still be present. Final Clinical  Impressions(s) / ED Diagnoses   Final diagnoses:  Elevated INR  Bruising  Tachycardia    ED Discharge Orders    None       Elizabeth Palau 06/02/19 1606    Eber Hong, MD 06/03/19 [phone]

## 2019-06-02 NOTE — H&P (Signed)
TRH H&P   Patient Demographics:    Stacy [name], is a 38 y.o. female  MRN: [id number]   DOB - [birth year]  Admit Date - 06/02/2019  Outpatient Primary MD for the patient is System, Provider Not In  Referring MD/NP/PA: PA Morelli  Patient coming from: Home  Chief Complaint  Patient presents with   Bleeding/Bruising      HPI:    Stacy [name]  is a 38 y.o. female, with past medical history of pulmonary embolism, on warfarin, diagnosed May/2020, alcohol abuse, gastric bypass, she presents to ED today secondary to easy bruising, patient report over the last 1 to 2 weeks is having multiple large bruise appearing all over in her body, denies any trauma or injury on the sides, any fall, head trauma, patient reports she has been taking warfarin, but she has not followed with her PCP who is in [place] for last month, so she did not monitor her INR level, denies fever, chills, chest pain or shortness of breath. - in ED INR was noted to be>10, hemoglobin stable, Hemoccult is negative, she received p.o. vitamin K, and I was called to admit.    Review of systems:    In addition to the HPI above,  No Fever-chills, No Headache, No changes with Vision or hearing, No problems swallowing food or Liquids, No Chest pain, Cough or Shortness of Breath, No Abdominal pain, No Nausea or Vommitting, Bowel movements are regular, No Blood in stool or Urine, No dysuria, No new skin rashes, but she reports easy bruising No new joints pains-aches,  No new weakness, tingling, numbness in any extremity, No recent weight gain or loss, No polyuria, polydypsia or polyphagia, No significant Mental Stressors.  A full 10 point Review of Systems was done, except as stated above, all other Review of Systems were negative.   With Past History of the following :    Past Medical History:    Diagnosis Date   Neuropathy 01/09/2019   Pulmonary embolism (HCC)       Past Surgical History:  Procedure Laterality Date   GASTRIC BYPASS     TONSILLECTOMY        Social History:     Social History   Tobacco Use   Smoking status: Current Some Day Smoker    Packs/day: 0.50    Types: Cigarettes   Smokeless tobacco: Never Used   Tobacco comment: 4 cigarettes daily when she does smoke  Substance Use Topics   Alcohol use: Yes    Comment: occ       Family History :     Family History  Problem Relation Age of Onset   Chronic infections Mother    Healthy Father    High blood pressure Maternal Grandfather    Diabetes Maternal Grandfather    Diabetes Paternal Grandmother    Cancer Paternal Grandmother  Home Medications:   Prior to Admission medications   Medication Sig Start Date End Date Taking? Authorizing Provider  acetaminophen (TYLENOL) 500 MG tablet Take 1,000 mg by mouth every 6 (six) hours as needed for mild pain or moderate pain.    Yes [provider]  amLODipine (NORVASC) 10 MG tablet Take 1 tablet (10 mg total) by mouth daily for 30 days. 01/15/19 06/02/19 Yes Pahwani, Daleen Bo, MD  gabapentin (NEURONTIN) 300 MG capsule Take 1 capsule (300 mg total) by mouth 3 (three) times daily for 30 days. 01/15/19 06/02/19 Yes Pahwani, Daleen Bo, MD  metoprolol tartrate (LOPRESSOR) 25 MG tablet Take 0.5 tablets (12.5 mg total) by mouth 2 (two) times daily for 30 days. 01/15/19 06/02/19 Yes Pahwani, Daleen Bo, MD  Multiple Vitamin (MULTIVITAMIN WITH MINERALS) TABS tablet Take 1 tablet by mouth daily.   Yes [provider]  warfarin (COUMADIN) 7.5 MG tablet Take 2 tablets (15 mg total) by mouth daily for 30 days. 01/15/19 06/02/19 Yes Pahwani, Daleen Bo, MD  enoxaparin (LOVENOX) 100 MG/ML injection Inject 0.85 mLs (85 mg total) into the skin every 12 (twelve) hours for 5 days. 01/15/19 01/20/19  Hughie Closs, MD  Guaifenesin 1200 MG TB12 Take 1 tablet (1,200 mg  total) by mouth 2 (two) times daily. Patient not taking: Reported on 01/06/2019 11/28/18   Charlestine Night, PA-C  HYDROcodone-acetaminophen (NORCO/VICODIN) 5-325 MG tablet Take 2 tablets by mouth every 4 (four) hours as needed. Patient not taking: Reported on 11/10/2018 10/23/18   Vanetta Mulders, MD  methocarbamol (ROBAXIN) 500 MG tablet Take 1 tablet (500 mg total) by mouth 2 (two) times daily. Patient not taking: Reported on 01/06/2019 11/10/18   Hedges, Tinnie Gens, PA-C  ondansetron (ZOFRAN ODT) 4 MG disintegrating tablet Take 1 tablet (4 mg total) by mouth every 8 (eight) hours as needed for nausea or vomiting. Patient not taking: Reported on 01/09/2019 01/06/19   Horton, Mayer Masker, MD  ondansetron (ZOFRAN) 4 MG tablet Take 1 tablet (4 mg total) by mouth every 6 (six) hours. Patient not taking: Reported on 01/06/2019 12/22/18   Eyvonne Mechanic, PA-C  oxyCODONE-acetaminophen (PERCOCET/ROXICET) 5-325 MG tablet Take 1 tablet by mouth every 6 (six) hours as needed for severe pain. Patient not taking: Reported on 01/06/2019 12/22/18   Eyvonne Mechanic, PA-C  promethazine-dextromethorphan (PROMETHAZINE-DM) 6.25-15 MG/5ML syrup Take 5 mLs by mouth 4 (four) times daily as needed for cough. Patient not taking: Reported on 01/06/2019 11/28/18   Charlestine Night, PA-C     Allergies:     Allergies  Allergen Reactions   Lovenox  [Enoxaparin Sodium] Hives   Ibuprofen     Gastric bypass surgery and history of gi bleed.     Physical Exam:   Vitals  Blood pressure 121/86, pulse (!) 134, temperature 98.3 F (36.8 C), temperature source Oral, resp. rate 18, height  (1.575 m), weight 84.8 kg, SpO2 100 %.   1. General well developed female, laying in bed in no apparent distress  2. Normal affect and insight, Not Suicidal or Homicidal, Awake Alert, Oriented X 3.  3. No F.N deficits, ALL C.Nerves Intact, Strength 5/5 all 4 extremities, Sensation intact all 4 extremities, Plantars down going.  4.  Ears and Eyes appear Normal, Conjunctivae clear, PERRLA. Moist Oral Mucosa.  5. Supple Neck, No JVD, No cervical lymphadenopathy appriciated, No Carotid Bruits.  6. Symmetrical Chest wall movement, Good air movement bilaterally, CTAB.  7.  Tachycardic, No Gallops, Rubs or Murmurs, No Parasternal Heave.  8. Positive Bowel Sounds, Abdomen  Soft, No tenderness, No organomegaly appriciated,No rebound -guarding or rigidity.  9.  No Cyanosis, Normal Skin Turgor, No Skin Rash, multiple skin bruising in multiple areas  10. Good muscle tone,  joints appear normal , no effusions, Normal ROM.  11. No Palpable Lymph Nodes in Neck or Axillae     Data Review:    CBC Recent Labs  Lab 06/02/19 1226  WBC 8.5  HGB 10.6*  HCT 34.9*  PLT 228  MCV 93.1  MCH 28.3  MCHC 30.4  RDW 15.4  LYMPHSABS 1.4  MONOABS 0.6  EOSABS 0.1  BASOSABS 0.0   ------------------------------------------------------------------------------------------------------------------  Chemistries  Recent Labs  Lab 06/02/19 1226  NA 136  K 4.2  CL 103  CO2 23  GLUCOSE 94  BUN 10  CREATININE 0.56  CALCIUM 8.8*  AST 22  ALT 17  ALKPHOS 68  BILITOT 1.4*   ------------------------------------------------------------------------------------------------------------------ estimated creatinine clearance is 96.3 mL/min (by C-G formula based on SCr of 0.56 mg/dL). ------------------------------------------------------------------------------------------------------------------ No results for input(s): TSH, T4TOTAL, T3FREE, THYROIDAB in the last 72 hours.  Invalid input(s): FREET3  Coagulation profile Recent Labs  Lab 06/02/19 1226  INR >10.0*   ------------------------------------------------------------------------------------------------------------------- No results for input(s): DDIMER in the last 72  hours. -------------------------------------------------------------------------------------------------------------------  Cardiac Enzymes No results for input(s): CKMB, TROPONINI, MYOGLOBIN in the last 168 hours.  Invalid input(s): CK ------------------------------------------------------------------------------------------------------------------ No results found for: BNP   ---------------------------------------------------------------------------------------------------------------  Urinalysis    Component Value Date/Time   COLORURINE YELLOW 01/09/2019 1735   APPEARANCEUR HAZY (A) 01/09/2019 1735   LABSPEC 1.019 01/09/2019 1735   PHURINE 5.0 01/09/2019 1735   GLUCOSEU NEGATIVE 01/09/2019 1735   [place] 01/09/2019 1735   [place] 01/09/2019 1735   [place] 01/09/2019 1735   PROTEINUR NEGATIVE 01/09/2019 1735   UROBILINOGEN 0.2 09/20/2008 1437   NITRITE NEGATIVE 01/09/2019 1735   LEUKOCYTESUR NEGATIVE 01/09/2019 1735    ----------------------------------------------------------------------------------------------------------------   Imaging Results:    No results found.  My personal review of EKG: Rhythm NSR, Rate  135 /min, QTc 456 , no Acute ST changes   Assessment & Plan:    Active Problems:   Alcohol abuse   Pulmonary embolism (HCC)   Supratherapeutic INR  Coagulopathy/supratherapeutic INR -Patient INR > 10, she comes with significant multiple bruising, she has been taking her warfarin without any monitoring for few weeks given her PCP is in [place]. -We will go ahead with reversal agents, will start with 5 of p.o. vitamin K(will avoid subcu or IM to avoid inducing hematoma), will give 4 units of FFP's, then will let it to drift less than 2 after that, then would resume her back on a lower dose warfarin. -We will obtain d-dimer now, and d-dimer in 4 weeks as appropriate to stop her anticoagulation, I have discussed with  hematology/oncology Dr. Delton Coombes, she should follow-up as an outpatient with him.   - she is a poor candidate for DOAC given her history of gastric bypass.  So she should be resumed on a lower dose warfarin as her INR is less than 2.   History of pulmonary embolism -Patient risk factors include smoking and interception(she is having contraception implant), discussed with her removing the implant and stop smoking which would lower her risk for VTE, and then likely she can stop anticoagulation after finishing 4 weeks of treatment.  Alcohol abuse -We will add on CIWA protocol.  Vitamin B 12 deficiency -Reports she is on oral supplement, despite that remains low, I have instructed her  IM would be a better option for her, but for now I would avoid any IM injection given supratherapeutic INR, continue with p.o. supplement during hospital stay  Hypertension -Blood pressure is acceptable, resume metoprolol, hold Norvasc  Tachycardia -Appears to be chronic sinus tachycardia, present even on previous admission, continue with metoprolol.  DVT Prophylaxis   AM Labs Ordered, also please review Full Orders  Family Communication: Admission, patients condition and plan of care including tests being ordered have been discussed with the patient who indicate understanding and agree with the plan and Code Status.  Code Status Full  Likely DC to Home  Condition GUARDED    Consults called: None  Admission status: Inpatient, given significantly elevated INR, with anticipated prolonged.  40 to trend down safely, and to come with alternative option for anticoagulation.  Time spent in minutes : 60 minutes   Huey Bienenstockawood Magdalina Whitehead M.D on 06/02/2019 at 3:55 PM  Between 7am to 7pm - Pager - [phone]. After 7pm go to [email or web address] - password [email or web address]  [facility] - Office  [phone]

## 2019-06-02 NOTE — ED Triage Notes (Signed)
Patient complaining of bruising and "knots" to different areas of body x 2 weeks. States she is on warfarin.

## 2019-06-02 NOTE — ED Provider Notes (Signed)
38 y/o female -comes in with complaint of bruising and knots to different areas of her body.  States over the last couple of weeks she has had multiple bruises arise despite the absence of trauma.  The patient was diagnosed with pulmonary embolism in May 2020 and has been on warfarin since that time.  She is feeling generally weak, she is not short of breath, on my exam she has a soft nontender abdomen, clear lung sounds, she has tachycardia to 127 bpm, she appears pale and the mucous membranes and the conjunctive as well as the nailbeds.  She has multiple bruises across her body in different stages of healing.  She denies any blood in the stools.  We will need work-up for bleeding, type and screen, may need transfusion, check platelets, CBC with differential.  The patient has a history of sinus tachycardia as well as substance abuse and alcohol abuse as well as alcohol withdrawal.  She does not appear to be tremulous or diaphoretic.  Medical screening examination/treatment/procedure(s) were conducted as a shared visit with non-physician practitioner(s) and myself.  I personally evaluated the patient during the encounter.  Clinical Impression:   Final diagnoses:  Elevated INR  Bruising  Tachycardia         Noemi Chapel, MD 06/03/19 [phone]

## 2019-06-02 NOTE — Progress Notes (Addendum)
ANTICOAGULATION REVERSAL CONSULT NOTE - Initial Consult  Pharmacy Consult for:  phytonadione dosing Indication:   INR >10  Allergies  Allergen Reactions  . Lovenox  [Enoxaparin Sodium] Hives  . Ibuprofen     Gastric bypass surgery and history of gi bleed.    Patient Measurements: Height: 5\' 2"  (157.5 cm) Weight: 187 lb (84.8 kg) IBW/kg (Calculated) : 50.1 Heparin Dosing Weight: HEPARIN DW (KG): 69.3  Vital Signs: Temp: 98.3 F (36.8 C) (10/14 1112) Temp Source: Oral (10/14 1112) BP: 125/84 (10/14 1112) Pulse Rate: 126 (10/14 1112)  Labs: Recent Labs    06/02/19 1226  HGB 10.6*  HCT 34.9*  PLT 228  LABPROT >90.0*  INR >10.0*  CREATININE 0.56    Estimated Creatinine Clearance: 96.3 mL/min (by C-G formula based on SCr of 0.56 mg/dL).   Medical History: Past Medical History:  Diagnosis Date  . Neuropathy 01/09/2019  . Pulmonary embolism (HCC)     Medications:  Patient was taking warfarin 15mg  daily (7.5mg  x2 tabs) Last dose was taken on 06-01-19 at 0900  Assessment: Pharmacy consulted to dose phytonadione for this patient  taking warfarin for history of PE in May of 2020.  No active bleeding is noted at this time, however, patient has bruising and knots all over her body. She has a history of ethanol abuse and hepatic steatosis,  which may make warfarin dosing a challenge.  Her  dose of warfarin needs to be decreased  when it is re-started.  Goal of Therapy:  INR 2-3 Monitor platelets by anticoagulation protocol: Yes   Plan:  Give phytonadione 5mg  po x1 dose now Hold warfarin until INR is <= 2.0   Despina Pole, Pharm. D. Clinical Pharmacist 06/02/2019 2:57 PM

## 2019-06-02 NOTE — ED Notes (Signed)
EKG given to Nuala Alpha, [place].

## 2019-06-03 LAB — RETICULOCYTES
Immature Retic Fract: 23.2 % — ABNORMAL HIGH (ref 2.3–15.9)
RBC.: 2.79 MIL/uL — ABNORMAL LOW (ref 3.87–5.11)
Retic Count, Absolute: 55 10*3/uL (ref 19.0–186.0)
Retic Ct Pct: 2 % (ref 0.4–3.1)

## 2019-06-03 LAB — HIV ANTIBODY (ROUTINE TESTING W REFLEX): HIV Screen 4th Generation wRfx: NONREACTIVE

## 2019-06-03 LAB — CBC
HCT: 23.1 % — ABNORMAL LOW (ref 36.0–46.0)
Hemoglobin: 7.3 g/dL — ABNORMAL LOW (ref 12.0–15.0)
MCH: 29 pg (ref 26.0–34.0)
MCHC: 31.6 g/dL (ref 30.0–36.0)
MCV: 91.7 fL (ref 80.0–100.0)
Platelets: 201 10*3/uL (ref 150–400)
RBC: 2.52 MIL/uL — ABNORMAL LOW (ref 3.87–5.11)
RDW: 14.9 % (ref 11.5–15.5)
WBC: 5.7 10*3/uL (ref 4.0–10.5)
nRBC: 0 % (ref 0.0–0.2)

## 2019-06-03 LAB — COMPREHENSIVE METABOLIC PANEL
ALT: 16 U/L (ref 0–44)
AST: 31 U/L (ref 15–41)
Albumin: 3.2 g/dL — ABNORMAL LOW (ref 3.5–5.0)
Alkaline Phosphatase: 60 U/L (ref 38–126)
Anion gap: 9 (ref 5–15)
BUN: 11 mg/dL (ref 6–20)
CO2: 25 mmol/L (ref 22–32)
Calcium: 8.3 mg/dL — ABNORMAL LOW (ref 8.9–10.3)
Chloride: 102 mmol/L (ref 98–111)
Creatinine, Ser: 0.49 mg/dL (ref 0.44–1.00)
GFR calc Af Amer: >60 mL/min (ref >60)
GFR calc non Af Amer: >60 mL/min (ref >60)
Glucose, Bld: 98 mg/dL (ref 70–99)
Potassium: 3.7 mmol/L (ref 3.5–5.1)
Sodium: 136 mmol/L (ref 135–145)
Total Bilirubin: 1.5 mg/dL — ABNORMAL HIGH (ref 0.3–1.2)
Total Protein: 6.2 g/dL — ABNORMAL LOW (ref 6.5–8.1)

## 2019-06-03 LAB — IRON AND TIBC
Iron: 152 ug/dL (ref 28–170)
Saturation Ratios: 46 % — ABNORMAL HIGH (ref 10.4–31.8)
TIBC: 328 ug/dL (ref 250–450)
UIBC: 176 ug/dL

## 2019-06-03 LAB — VITAMIN B12: Vitamin B-12: 240 pg/mL (ref 180–914)

## 2019-06-03 LAB — SARS CORONAVIRUS 2 (TAT 6-24 HRS): SARS Coronavirus 2: NEGATIVE

## 2019-06-03 LAB — PROTIME-INR
INR: 1.9 — ABNORMAL HIGH (ref 0.8–1.2)
Prothrombin Time: 21.1 seconds — ABNORMAL HIGH (ref 11.4–15.2)

## 2019-06-03 LAB — FOLATE: Folate: 4.8 ng/mL — ABNORMAL LOW (ref >5.9)

## 2019-06-03 LAB — HEMOGLOBIN AND HEMATOCRIT, BLOOD
HCT: 25.5 % — ABNORMAL LOW (ref 36.0–46.0)
Hemoglobin: 8 g/dL — ABNORMAL LOW (ref 12.0–15.0)

## 2019-06-03 LAB — FERRITIN: Ferritin: 83 ng/mL (ref 11–307)

## 2019-06-03 MED ORDER — FOLIC ACID 1 MG PO TABS: 1.0000 mg | ORAL_TABLET | Freq: Every day | ORAL | 0 refills | Status: AC

## 2019-06-03 MED ORDER — THIAMINE HCL 100 MG PO TABS: 100.0000 mg | ORAL_TABLET | Freq: Every day | ORAL | 0 refills | Status: AC

## 2019-06-03 MED ORDER — WARFARIN SODIUM 1 MG PO TABS: 1.0000 mg | ORAL_TABLET | Freq: Every day | ORAL | 2 refills | Status: DC

## 2019-06-03 MED ORDER — CYANOCOBALAMIN 1000 MCG PO TABS: 1000.0000 ug | ORAL_TABLET | Freq: Every day | ORAL | 0 refills | Status: AC

## 2019-06-03 NOTE — Progress Notes (Signed)
Patient B/p 92/58, patient was given her scheduled metoprolol earlier. Will notify Dr.

## 2019-06-03 NOTE — Discharge Summary (Signed)
Physician Discharge Summary  Stacy [name] ZOX:[id number] DOB: [birth year] DOA: 06/02/2019  PCP: System, Provider Not In  Admit date: 06/02/2019  Discharge date: 06/03/2019  Admitted From:Home  Disposition:  Home  Recommendations for Outpatient Follow-up:  1. Follow up with new PCP Dr. Judee Clara on Monday, 06/07/19 at 11am as scheduled 2. Follow-up with Dr. Ellin Saba in 1 to 2 weeks as will be scheduled 3. Follow-up INR levels at [facility] office with cardiology on 10/19; Dr. Diona Browner to help manage with formal follow-up appointment on 07/02/2019 4. Continue now on Coumadin 1 mg daily until further INR recheck 5. Continue other prior home medications as before.  Home Health: None  Equipment/Devices: None  Discharge Condition: Stable  CODE STATUS: Full  Diet recommendation: Heart Healthy  Brief/Interim Summary: Per HPI: Stacy [name]  is a 38 y.o. female, with past medical history of pulmonary embolism, on warfarin, diagnosed May/2020, alcohol abuse, gastric bypass, she presents to ED today secondary to easy bruising, patient report over the last 1 to 2 weeks is having multiple large bruise appearing all over in her body, denies any trauma or injury on the sides, any fall, head trauma, patient reports she has been taking warfarin, but she has not followed with her PCP who is in [place] for last month, so she did not monitor her INR level, denies fever, chills, chest pain or shortness of breath. - in ED INR was noted to be>10, hemoglobin stable, Hemoccult is negative, she received p.o. vitamin K, and I was called to admit.  Patient was admitted with severe coagulopathy secondary to supratherapeutic INR greater than 10.  She was given oral vitamin K as well as 4 units of FFP with INR that is now come down to 1.9.  Her hemoglobin was noted to be low at 7.3, but stool occult noted to be negative and repeat hemoglobin test noted to be stable at hemoglobin 8 with hematocrit 25.  She has no  overt bleeding identified and has stable vital signs and appears stable for discharge.  Discussed case with Dr. Ellin Saba of hematology who recommends that patient remain on half of her usual dose of Coumadin at 1 mg daily and will follow up with her in the outpatient setting.  She will also need to establish care with new PCP as well as have INR recheck on Monday of the following week as noted above.  This is all been established prior to discharge and she is otherwise safe for discharge with no acute concerns or needs at this time.  Anemia panel also performed with no B12 or iron deficiency otherwise noted.  Discharge Diagnoses:  Active Problems:   Alcohol abuse   Pulmonary embolism (HCC)   Supratherapeutic INR  Principal discharge diagnosis: Coagulopathy secondary to supratherapeutic INR with noncompliance on INR testing.  Discharge Instructions  Discharge Instructions    Diet - low sodium heart healthy   Complete by: As directed    Increase activity slowly   Complete by: As directed      Allergies as of 06/03/2019      Reactions   Lovenox  [enoxaparin Sodium] Hives   Ibuprofen    Gastric bypass surgery and history of gi bleed.      Medication List    STOP taking these medications   enoxaparin 100 MG/ML injection Commonly known as: LOVENOX   HYDROcodone-acetaminophen 5-325 MG tablet Commonly known as: NORCO/VICODIN   oxyCODONE-acetaminophen 5-325 MG tablet Commonly known as: PERCOCET/ROXICET     TAKE these medications  acetaminophen 500 MG tablet Commonly known as: TYLENOL Take 1,000 mg by mouth every 6 (six) hours as needed for mild pain or moderate pain.   amLODipine 10 MG tablet Commonly known as: NORVASC Take 1 tablet (10 mg total) by mouth daily for 30 days.   cyanocobalamin 1000 MCG tablet Take 1 tablet (1,000 mcg total) by mouth daily. Start taking on: June 04, 2019   folic acid 1 MG tablet Commonly known as: FOLVITE Take 1 tablet (1 mg total) by  mouth daily. Start taking on: June 04, 2019   gabapentin 300 MG capsule Commonly known as: NEURONTIN Take 1 capsule (300 mg total) by mouth 3 (three) times daily for 30 days.   Guaifenesin 1200 MG Tb12 Take 1 tablet (1,200 mg total) by mouth 2 (two) times daily.   methocarbamol 500 MG tablet Commonly known as: ROBAXIN Take 1 tablet (500 mg total) by mouth 2 (two) times daily.   metoprolol tartrate 25 MG tablet Commonly known as: LOPRESSOR Take 0.5 tablets (12.5 mg total) by mouth 2 (two) times daily for 30 days.   multivitamin with minerals Tabs tablet Take 1 tablet by mouth daily.   ondansetron 4 MG disintegrating tablet Commonly known as: Zofran ODT Take 1 tablet (4 mg total) by mouth every 8 (eight) hours as needed for nausea or vomiting.   ondansetron 4 MG tablet Commonly known as: ZOFRAN Take 1 tablet (4 mg total) by mouth every 6 (six) hours.   promethazine-dextromethorphan 6.25-15 MG/5ML syrup Commonly known as: PROMETHAZINE-DM Take 5 mLs by mouth 4 (four) times daily as needed for cough.   thiamine 100 MG tablet Take 1 tablet (100 mg total) by mouth daily. Start taking on: June 04, 2019   warfarin 1 MG tablet Commonly known as: Coumadin Take 1 tablet (1 mg total) by mouth daily. What changed:   medication strength  how much to take      Follow-up Information    Corum, Minerva FesterLisa L, MD. Go on 06/07/2019.   Specialty: Family Medicine Why: Please arrive by 11:00AM to establish care/hospital follow up appointment and to have your INR checked. Contact information: [address] [place] [address] [phone]        Doreatha MassedKatragadda, Sreedhar, MD Follow up in 1 week(s).   Specialty: Hematology Contact information: [address] [place] [address] [phone]          Allergies  Allergen Reactions  . Lovenox  [Enoxaparin Sodium] Hives  . Ibuprofen     Gastric bypass surgery and history of gi bleed.    Consultations:  Hematology on  phone   Procedures/Studies:  No results found.   Discharge Exam: Vitals:   06/03/19 0330 06/03/19 1339  BP: 111/72 115/75  Pulse: 93 89  Resp: 20 18  Temp: 98.2 F (36.8 C) 98.3 F (36.8 C)  SpO2: 100% 100%   Vitals:   06/03/19 0127 06/03/19 0134 06/03/19 0330 06/03/19 1339  BP: 92/63 92/63 111/72 115/75  Pulse: (!) 105 (!) 105 93 89  Resp: 20 20 20 18   Temp: 98.8 F (37.1 C) 98.8 F (37.1 C) 98.2 F (36.8 C) 98.3 F (36.8 C)  TempSrc: Oral  Oral Oral  SpO2: 100%  100% 100%  Weight:      Height:        General: Pt is alert, awake, not in acute distress Cardiovascular: RRR, S1/S2 +, no rubs, no gallops Respiratory: CTA bilaterally, no wheezing, no rhonchi Abdominal: Soft, NT, ND, bowel sounds +  Extremities: no edema, no cyanosis    The results of significant diagnostics from this hospitalization (including imaging, microbiology, ancillary and laboratory) are listed below for reference.     Microbiology: Recent Results (from the past 240 hour(s))  SARS CORONAVIRUS 2 (TAT 6-24 HRS) Nasopharyngeal Nasopharyngeal Swab     Status: None   Collection Time: 06/02/19  3:24 PM   Specimen: Nasopharyngeal Swab  Result Value Ref Range Status   SARS Coronavirus 2 NEGATIVE NEGATIVE Final    Comment: (NOTE) SARS-CoV-2 target nucleic acids are NOT DETECTED. The SARS-CoV-2 RNA is generally detectable in upper and lower respiratory specimens during the acute phase of infection. Negative results do not preclude SARS-CoV-2 infection, do not rule out co-infections with other pathogens, and should not be used as the sole basis for treatment or other patient management decisions. Negative results must be combined with clinical observations, patient history, and epidemiological information. The expected result is Negative. Fact Sheet for Patients: SugarRoll.be Fact Sheet for Healthcare Providers: https://www.woods-mathews.com/ This test  is not yet approved or cleared by the [place] FDA and  has been authorized for detection and/or diagnosis of SARS-CoV-2 by FDA under an Emergency Use Authorization (EUA). This EUA will remain  in effect (meaning this test can be used) for the duration of the COVID-19 declaration under Section 56 4(b)(1) of the Act, 21 U.S.C. section 360bbb-3(b)(1), unless the authorization is terminated or revoked sooner. Performed at [facility], [place] [address] [place], [place] [address]      Labs: BNP (last 3 results) No results for input(s): BNP in the last 8760 hours. Basic Metabolic Panel: Recent Labs  Lab 06/02/19 1226 06/03/19 0423  NA 136 136  K 4.2 3.7  CL 103 102  CO2 23 25  GLUCOSE 94 98  BUN 10 11  CREATININE 0.56 0.49  CALCIUM 8.8* 8.3*   Liver Function Tests: Recent Labs  Lab 06/02/19 1226 06/03/19 0423  AST 22 31  ALT 17 16  ALKPHOS 68 60  BILITOT 1.4* 1.5*  PROT 7.6 6.2*  ALBUMIN 3.9 3.2*   No results for input(s): LIPASE, AMYLASE in the last 168 hours. No results for input(s): AMMONIA in the last 168 hours. CBC: Recent Labs  Lab 06/02/19 1226 06/03/19 0423 06/03/19 0849  WBC 8.5 5.7  --   NEUTROABS 6.4  --   --   HGB 10.6* 7.3* 8.0*  HCT 34.9* 23.1* 25.5*  MCV 93.1 91.7  --   PLT 228 201  --    Cardiac Enzymes: No results for input(s): CKTOTAL, CKMB, CKMBINDEX, TROPONINI in the last 168 hours. BNP: Invalid input(s): POCBNP CBG: No results for input(s): GLUCAP in the last 168 hours. D-Dimer Recent Labs    06/02/19 1226  DDIMER 0.57*   Hgb A1c No results for input(s): HGBA1C in the last 72 hours. Lipid Profile No results for input(s): CHOL, HDL, LDLCALC, TRIG, CHOLHDL, LDLDIRECT in the last 72 hours. Thyroid function studies No results for input(s): TSH, T4TOTAL, T3FREE, THYROIDAB in the last 72 hours.  Invalid input(s): FREET3 Anemia work up Recent Labs    06/02/19 1226 06/03/19 1126 06/03/19 1128  VITAMINB12 162* 240  --    FOLATE 6.0 4.8*  --   FERRITIN  --  83  --   TIBC  --  328  --   IRON  --  152  --   RETICCTPCT  --   --  2.0   Urinalysis    Component Value Date/Time  COLORURINE YELLOW 06/02/2019 1515   APPEARANCEUR HAZY (A) 06/02/2019 1515   LABSPEC 1.024 06/02/2019 1515   PHURINE 5.0 06/02/2019 1515   GLUCOSEU NEGATIVE 06/02/2019 1515   HGBUR SMALL (A) 06/02/2019 1515   BILIRUBINUR NEGATIVE 06/02/2019 1515   KETONESUR 5 (A) 06/02/2019 1515   PROTEINUR NEGATIVE 06/02/2019 1515   UROBILINOGEN 0.2 09/20/2008 1437   NITRITE NEGATIVE 06/02/2019 1515   LEUKOCYTESUR NEGATIVE 06/02/2019 1515   Sepsis Labs Invalid input(s): PROCALCITONIN,  WBC,  LACTICIDVEN Microbiology Recent Results (from the past 240 hour(s))  SARS CORONAVIRUS 2 (TAT 6-24 HRS) Nasopharyngeal Nasopharyngeal Swab     Status: None   Collection Time: 06/02/19  3:24 PM   Specimen: Nasopharyngeal Swab  Result Value Ref Range Status   SARS Coronavirus 2 NEGATIVE NEGATIVE Final    Comment: (NOTE) SARS-CoV-2 target nucleic acids are NOT DETECTED. The SARS-CoV-2 RNA is generally detectable in upper and lower respiratory specimens during the acute phase of infection. Negative results do not preclude SARS-CoV-2 infection, do not rule out co-infections with other pathogens, and should not be used as the sole basis for treatment or other patient management decisions. Negative results must be combined with clinical observations, patient history, and epidemiological information. The expected result is Negative. Fact Sheet for Patients: HairSlick.no Fact Sheet for Healthcare Providers: quierodirigir.com This test is not yet approved or cleared by the [place] FDA and  has been authorized for detection and/or diagnosis of SARS-CoV-2 by FDA under an Emergency Use Authorization (EUA). This EUA will remain  in effect (meaning this test can be used) for the duration of  the COVID-19 declaration under Section 56 4(b)(1) of the Act, 21 U.S.C. section 360bbb-3(b)(1), unless the authorization is terminated or revoked sooner. Performed at [facility], [address], [place], [place] [address]      Time coordinating discharge: 35 minutes  SIGNED:   Erick Blinks, DO [facility] 06/03/2019, 1:58 PM  If 7PM-7AM, please contact night-coverage [email or web address] Password [email or web address]

## 2019-06-03 NOTE — Progress Notes (Signed)
Pt discharged via wheelchair to POV with belongings in her possession. °

## 2019-06-03 NOTE — Progress Notes (Signed)
MD made aware of B/P 92/58. MD was also made aware patient is currently receiving platelets. Patient is stable.

## 2019-06-03 NOTE — Clinical Social Work Note (Addendum)
MD asked LCSW to help patient establish with a local PCP for hospital follow up and INR check early next week. Spoke with pt by phone and arranged for Monday 10/19 appointment with Dr. Holly Bodily here in [place]. Appointment info added to pt's AVS. There are no other TOC needs for dc.  Received return call from Dr. Charlestine Massed office stating that she can follow pt for PCP but that she doesn't manage PT/INR.   Spoke with pt and at her request, referred pt to [place] care for the PT/INR and cardiologist. Updated Dr. Manuella Ghazi.

## 2019-06-03 NOTE — Progress Notes (Signed)
Lab informed this nurse that the patient Hemoglobin went from 10 to 7. Patient was given 4 bags of plasma. MD made aware.

## 2019-06-03 NOTE — Progress Notes (Addendum)
MD notified of Hemoglobin, orders to repeat a CBC later in the day. CBC scheduled today for 1300

## 2019-06-04 LAB — PREPARE FRESH FROZEN PLASMA
Unit division: 0
Unit division: 0
Unit division: 0
Unit division: 0

## 2019-06-04 LAB — BPAM FFP
Blood Product Expiration Date: 202010192359
Blood Product Expiration Date: 202010192359
Blood Product Expiration Date: 202010192359
Blood Product Expiration Date: 202010192359
ISSUE DATE / TIME: 202010141642
ISSUE DATE / TIME: 202010141941
ISSUE DATE / TIME: 202010142229
ISSUE DATE / TIME: 202010150108
Unit Type and Rh: 5100
Unit Type and Rh: 5100
Unit Type and Rh: 600
Unit Type and Rh: 6200

## 2019-06-10 ENCOUNTER — Encounter (HOSPITAL_COMMUNITY): Payer: Self-pay | Admitting: *Deleted

## 2019-06-10 ENCOUNTER — Other Ambulatory Visit: Payer: Self-pay

## 2019-06-11 ENCOUNTER — Ambulatory Visit (HOSPITAL_COMMUNITY): Payer: Medicaid Other | Admitting: Hematology

## 2019-07-02 ENCOUNTER — Ambulatory Visit: Payer: Medicaid Other | Admitting: Cardiology

## 2019-09-15 ENCOUNTER — Other Ambulatory Visit: Payer: Self-pay

## 2019-09-15 ENCOUNTER — Emergency Department (HOSPITAL_COMMUNITY): Payer: Medicaid Other

## 2019-09-15 ENCOUNTER — Encounter (HOSPITAL_COMMUNITY): Payer: Self-pay | Admitting: Emergency Medicine

## 2019-09-15 ENCOUNTER — Emergency Department (HOSPITAL_COMMUNITY)
Admission: EM | Admit: 2019-09-15 | Discharge: 2019-09-15 | Disposition: A | Discharge status: Home / Self Care | Payer: Medicaid Other | Attending: Emergency Medicine | Admitting: Emergency Medicine

## 2019-09-15 DIAGNOSIS — R05 Cough: Secondary | ICD-10-CM | POA: Insufficient documentation

## 2019-09-15 DIAGNOSIS — F17210 Nicotine dependence, cigarettes, uncomplicated: Secondary | ICD-10-CM | POA: Insufficient documentation

## 2019-09-15 DIAGNOSIS — Z20822 Contact with and (suspected) exposure to covid-19: Secondary | ICD-10-CM | POA: Insufficient documentation

## 2019-09-15 LAB — SARS CORONAVIRUS 2 (TAT 6-24 HRS): SARS Coronavirus 2: NEGATIVE

## 2019-09-15 LAB — POC SARS CORONAVIRUS 2 AG -  ED: SARS Coronavirus 2 Ag: NEGATIVE

## 2019-09-15 MED ORDER — ACETAMINOPHEN 325 MG PO TABS
650.0000 mg | ORAL_TABLET | Freq: Once | ORAL | Status: AC
  Administered 2019-09-15: 650 mg via ORAL
  Filled 2019-09-15: qty 2

## 2019-09-15 MED ORDER — ONDANSETRON 4 MG PO TBDP
4.0000 mg | ORAL_TABLET | Freq: Once | ORAL | Status: AC
  Administered 2019-09-15: 4 mg via ORAL
  Filled 2019-09-15: qty 1

## 2019-09-15 MED ORDER — LIDOCAINE VISCOUS HCL 2 % MT SOLN: 15.0000 mL | Freq: Four times a day (QID) | OROMUCOSAL | 0 refills | Status: DC | PRN

## 2019-09-15 MED ORDER — DEXAMETHASONE 4 MG PO TABS
8.0000 mg | ORAL_TABLET | Freq: Once | ORAL | Status: AC
  Administered 2019-09-15: 15:00:00 8 mg via ORAL
  Filled 2019-09-15: qty 2

## 2019-09-15 MED ORDER — GABAPENTIN 300 MG PO CAPS: 300.0000 mg | ORAL_CAPSULE | Freq: Three times a day (TID) | ORAL | 0 refills | Status: DC

## 2019-09-15 NOTE — ED Provider Notes (Signed)
[facility] EMERGENCY DEPARTMENT Provider Note   CSN: [id number] Arrival date & time: 09/15/19  1116     History Chief Complaint  Patient presents with  . Cough    Stacy [name] is a 39 y.o. female with past medical history significant for neuropathy, PE presenting to emergency department today with multiple chief complaints including sore throat, body aches, rhinorrhea, numbness in bilateral legs, productive cough and headache.  This all started today when she woke up. She denies associated neck pain. Patient was diagnosed with PE in May 2020 and was taking Coumadin.  She states her doctor in [place] took her off Coumadin approximately 3 months ago.  She is now living in [place] and being to establish a primary care doctor in the area. She admits to positive Covid contact recently.  She went to bed feeling fine yesterday however when she woke up this morning felt poorly.  She states her throat feels scratchy when swallowing.  She has a productive cough with brown phlegm.  Also states she has nausea without emesis after coughing fits. She has a headache that is progressively worsened since onset.   She does say it feels like headache she has had in the past.  She has not taken any medications for symptoms prior to arrival. She denies any fever, chills, chest pain, shortness of breath, abdominal pain, lower extremity edema, weakness, tingling.  History provided by patient with additional history obtained from chart review.     Past Medical History:  Diagnosis Date  . Neuropathy 01/09/2019  . Pulmonary embolism [facility])     Patient Active Problem List   Diagnosis Date Noted  . Supratherapeutic INR 06/02/2019  . Leukocytosis 01/09/2019  . Alcohol abuse 01/09/2019  . Elevated LFTs 01/09/2019  . Hepatic steatosis 01/09/2019  . Alcohol dependence with uncomplicated withdrawal (HCC) 01/09/2019  . Alcohol withdrawal (HCC) 01/09/2019  . Vitamin D deficiency 01/09/2019  . Folic acid  deficiency 01/09/2019  . Non-sustained ventricular tachycardia (HCC) 01/09/2019  . Lactic acidosis 01/09/2019  . Pulmonary embolism (HCC) 01/09/2019  . Acute metabolic encephalopathy 01/09/2019  . Closed fracture of distal end of left humerus 04/29/18 05/06/2018    Past Surgical History:  Procedure Laterality Date  . GASTRIC BYPASS    . TONSILLECTOMY       OB History    Gravida      Para      Term      Preterm      AB      Living  3     SAB      TAB      Ectopic      Multiple      Live Births              Family History  Problem Relation Age of Onset  . Chronic infections Mother   . Diabetes Mother   . Healthy Father   . High blood pressure Maternal Grandfather   . Diabetes Maternal Grandfather   . Diabetes Paternal Grandmother   . Cancer Paternal Grandmother     Social History   Tobacco Use  . Smoking status: Current Some Day Smoker    Packs/day: 0.50    Types: Cigarettes  . Smokeless tobacco: Never Used  . Tobacco comment: 4 cigarettes daily when she does smoke  Substance Use Topics  . Alcohol use: Yes    Comment: occ  . Drug use: No    Home Medications Prior to Admission medications  Medication Sig Start Date End Date Taking? Authorizing Provider  acetaminophen (TYLENOL) 500 MG tablet Take 1,000 mg by mouth every 6 (six) hours as needed for mild pain or moderate pain.     [provider]  amLODipine (NORVASC) 10 MG tablet Take 1 tablet (10 mg total) by mouth daily for 30 days. 01/15/19 06/10/19  Hughie Closs, MD  gabapentin (NEURONTIN) 300 MG capsule Take 1 capsule (300 mg total) by mouth 3 (three) times daily. 09/15/19 10/15/19  Wanetta Funderburke E, PA-C  lidocaine (XYLOCAINE) 2 % solution Use as directed 15 mLs in the mouth or throat every 6 (six) hours as needed for mouth pain. 09/15/19   Jin Capote E, PA-C  metoprolol tartrate (LOPRESSOR) 25 MG tablet Take 0.5 tablets (12.5 mg total) by mouth 2 (two) times daily for 30  days. 01/15/19 06/10/19  Hughie Closs, MD  Multiple Vitamin (MULTIVITAMIN WITH MINERALS) TABS tablet Take 1 tablet by mouth daily.    [provider]  warfarin (COUMADIN) 1 MG tablet Take 1 tablet (1 mg total) by mouth daily. 06/03/19 07/03/19  Maurilio Lovely D, DO    Allergies    Lovenox  [enoxaparin sodium] and Ibuprofen  Review of Systems   Review of Systems  All other systems are reviewed and are negative for acute change except as noted in the HPI.   Physical Exam Updated Vital Signs BP (!) 123/99 (BP Location: Left Arm)   Pulse (!) 122   Temp 99.3 F (37.4 C) (Oral)   Resp 16   Ht 5\' 2"  (1.575 m)   Wt 85.3 kg   SpO2 97%   BMI 34.39 kg/m   Physical Exam Vitals and nursing note reviewed.  Constitutional:      General: She is not in acute distress.    Appearance: She is not ill-appearing.  HENT:     Head: Normocephalic and atraumatic.     Comments: No sinus or temporal tenderness.     Right Ear: Tympanic membrane and external ear normal.     Left Ear: Tympanic membrane and external ear normal.     Nose: Nose normal.     Mouth/Throat:     Mouth: Mucous membranes are moist.     Pharynx: Oropharynx is clear.     Comments: Minor erythema to oropharynx, no edema, no exudate, no tonsillar swelling, voice normal, neck supple without lymphadenopathy Eyes:     General: No scleral icterus.       Right eye: No discharge.        Left eye: No discharge.     Extraocular Movements: Extraocular movements intact.     Conjunctiva/sclera: Conjunctivae normal.     Pupils: Pupils are equal, round, and reactive to light.  Neck:     Vascular: No JVD.     Comments: Full ROM intact without spinous process TTP. No bony stepoffs or deformities, no paraspinous muscle TTP or muscle spasms. No rigidity or meningeal signs. No bruising, erythema, or swelling.  Cardiovascular:     Rate and Rhythm: Regular rhythm. Tachycardia present.     Pulses: Normal pulses.          Radial pulses  are 2+ on the right side and 2+ on the left side.       Dorsalis pedis pulses are 2+ on the right side and 2+ on the left side.     Heart sounds: Normal heart sounds.     Comments: Heart rate ranging from 100-115 during exam. Pulmonary:  Comments: Lungs clear to auscultation in all fields. Symmetric chest rise. No wheezing, rales, or rhonchi. Abdominal:     Palpations: Abdomen is soft.     Comments: Abdomen is soft, non-distended, and non-tender in all quadrants. No rigidity, no guarding. No peritoneal signs.  Musculoskeletal:        General: Normal range of motion.     Cervical back: Normal range of motion.     Right lower leg: No edema.     Left lower leg: No edema.     Comments: Homans sign absent bilaterally, no lower extremity edema, no palpable cords, compartments are soft  Skin:    General: Skin is warm and dry.     Capillary Refill: Capillary refill takes less than 2 seconds.     Findings: No rash.  Neurological:     Mental Status: She is oriented to person, place, and time.     GCS: GCS eye subscore is 4. GCS verbal subscore is 5. GCS motor subscore is 6.     Comments: Fluent speech, no facial droop.  Sensation grossly intact to light touch in the lower extremities bilaterally. No saddle anesthesias. Strength 5/5 with flexion and extension at the bilateral hips, knees, and ankles. No noted gait deficit.    Psychiatric:        Behavior: Behavior normal.     ED Results / Procedures / Treatments   Labs (all labs ordered are listed, but only abnormal results are displayed) Labs Reviewed  SARS CORONAVIRUS 2 (TAT 6-24 HRS)  POC SARS CORONAVIRUS 2 AG -  ED    EKG None  Radiology DG Chest Portable 1 View  Result Date: 09/15/2019 CLINICAL DATA:  Cough and headache EXAM: PORTABLE CHEST 1 VIEW COMPARISON:  Jan 09, 2019 FINDINGS: Lungs are clear. Heart size and pulmonary vascularity are normal. No adenopathy. No bone lesions. IMPRESSION: No abnormality noted.  Electronically Signed   By: Bretta Bang III M.D.   On: 09/15/2019 13:15    Procedures Procedures (including critical care time)  Medications Ordered in ED Medications  acetaminophen (TYLENOL) tablet 650 mg (650 mg Oral Given 09/15/19 1305)  ondansetron (ZOFRAN-ODT) disintegrating tablet 4 mg (4 mg Oral Given 09/15/19 1304)  dexamethasone (DECADRON) tablet 8 mg (8 mg Oral Given 09/15/19 1455)    ED Course  I have reviewed the triage vital signs and the nursing notes.  Pertinent labs & imaging results that were available during my care of the patient were reviewed by me and considered in my medical decision making (see chart for details). Vitals:   09/15/19 1149 09/15/19 1150 09/15/19 1340  BP: (!) 123/99  120/82  Pulse: (!) 122  100  Resp: 16  17  Temp: 99.3 F (37.4 C)  99.1 F (37.3 C)  TempSrc: Oral  Oral  SpO2: 97%  100%  Weight:  85.3 kg   Height:  5\' 2"  (1.575 m)       MDM Rules/Calculators/A&P                       Patient seen and examined. Patient presents awake, alert, hemodynamically stable, non toxic, temperature 99.3. No hypoxia. She was noted to be tachycardic in triage to 122.  During my exam patient looks to not feel well however does not appear to be toxic.  Her lungs are clear to auscultation all fields and she has normal work of breathing.  She is tachycardic during exam ranging from 115-120. No unilateral leg  swelling, negative Homans' sign bilaterally.  Patient given Tylenol and Zofran. I viewed pt's chest xray and it does not suggest acute infectious processes.  Rapid Covid antigen test is negative. After receiving the Tylenol and Zofran her vital signs have improved, rate is now 100 and temperature trending down at 99.1 Feel that tachycardia was related to fever. Patient ambulated in the emergency department without respiratory distress, tachycardia, or hypoxia. SpO2 during ambulation >94% on room air.  Discussed results with patient as this could be a  false negative.  We will proceed with PCR Covid test.  Engaged in shared decision making in regards to possibility of a PE.  She has a history of 1 and is not currently on anticoagulation however here is not hypoxic, denies chest pain or shortness of breath.  No unilateral leg swelling and has negative Homans' sign.  Feel that PE is less likely.  Her symptoms are more consistent with Covid or viral illness.  She does not wish to proceed with further work-up for PE instead will have confirmatory Covid test.  Discussed symptomatic care with patient.  She knows to return to the emergency department if her symptoms worsen. Will discharge home with symptomatic care.  She is also questing refill for her gabapentin.  She is currently in the process of getting established with a PCP now that she lives in [place] full-time.  Resource list given.  The patient appears reasonably screened and/or stabilized for discharge and I doubt any other medical condition or other [facility] requiring further screening, evaluation, or treatment in the ED at this time prior to discharge. The patient is safe for discharge with strict return precautions discussed. Recommend pcp follow up. Findings and plan of care discussed with supervising physician Dr. Eulis Foster who is in agreement with this plan.   Stacy [name] was evaluated in Emergency Department on 09/15/2019 for the symptoms described in the history of present illness. She was evaluated in the context of the global COVID-19 pandemic, which necessitated consideration that the patient might be at risk for infection with the SARS-CoV-2 virus that causes COVID-19. Institutional protocols and algorithms that pertain to the evaluation of patients at risk for COVID-19 are in a state of rapid change based on information released by regulatory bodies including the CDC and federal and state organizations. These policies and algorithms were followed during the patient's care in the  ED.  Portions of this note were generated with [employer]. Dictation errors may occur despite best attempts at proofreading.  Final Clinical Impression(s) / ED Diagnoses Final diagnoses:  Exposure to COVID-19 virus    Rx / DC Orders ED Discharge Orders         Ordered    gabapentin (NEURONTIN) 300 MG capsule  3 times daily     09/15/19 1420    lidocaine (XYLOCAINE) 2 % solution  Every 6 hours PRN     09/15/19 1436           Joyanne Eddinger, Harley Hallmark, PA-C 09/15/19 1459    Daleen Bo, MD 09/16/19 1408

## 2019-09-15 NOTE — Discharge Instructions (Addendum)
You were seen in the ED for body aches, cough.  I suspect you have a virus. We tested your for COVID-19 (coronavirus) infection.  It is also possible you could have other viral upper respiratory infection from another virus.    Test results come back in 48 hours, sometimes sooner.  Someone will call you if ypu are positive for COVID. If the result is negative you can see it on your MyChart.  Treatment of your illness and symptoms will include self-isolation, monitoring of symptoms and supportive care with over-the-counter medicines.    Return to the ED if there is increased work of breathing, shortness of breath, inability to tolerate fluids, weakness, chest pain.  If your test results are POSITIVE, the following isolation requirements need to be met to return to work and resume essential activities: At least 10 days since symptom onset  72 hours of absence of fever without antifever medicine (ibuprofen, acetaminophen). A fever is temperature of 100.71F or greater. Improvement of respiratory symptoms  If your test is NEGATIVE, you may return to work and essential activities as long as your symptoms have improved and you do not have a fever for a total of 3 days.  Call your job and notify them that your test result was negative to see if they will allow you to return to work.   Stay well-hydrated. Rest. You can use over the counter medications to help with symptoms: 600 mg ibuprofen (motrin, aleve, advil) or acetaminophen (tylenol) every 6 hours, around the clock to help with associated fevers, sore throat, headaches, generalized body aches and malaise.  Oxymetazoline (afrin) intranasal spray once daily for no more than 3 days to help with congestion, after 3 days you can switch to another over-the-counter nasal steroid spray such as fluticasone (flonase) Allergy medication (loratadine, cetirizine, etc) and phenylephrine (sudafed) help with nasal congestion, runny nose and postnasal drip.     Dextromethorphan (Delsym) to suppress dry cough. Frequent coughing is likely causing your chest wall pain Wash your hands often to prevent spread.    -It is very important you establish care with a primary care provider.  Please call the number listed in your discharge paperwork to help you establish care with a local doctor.

## 2019-09-15 NOTE — ED Triage Notes (Signed)
Patient reports she woke up today with sore throat, body aches, runny nose, numbness in both her legs, cough, and headache.

## 2020-07-03 IMAGING — DX DG KNEE COMPLETE 4+V*R*
4 series · 4 of 4 positions shown · non-contrast
Comparison: None.

CLINICAL DATA: Sharp right knee pain

EXAM:
RIGHT KNEE - COMPLETE 4+ VIEW

[knee ap (1 of 3)]
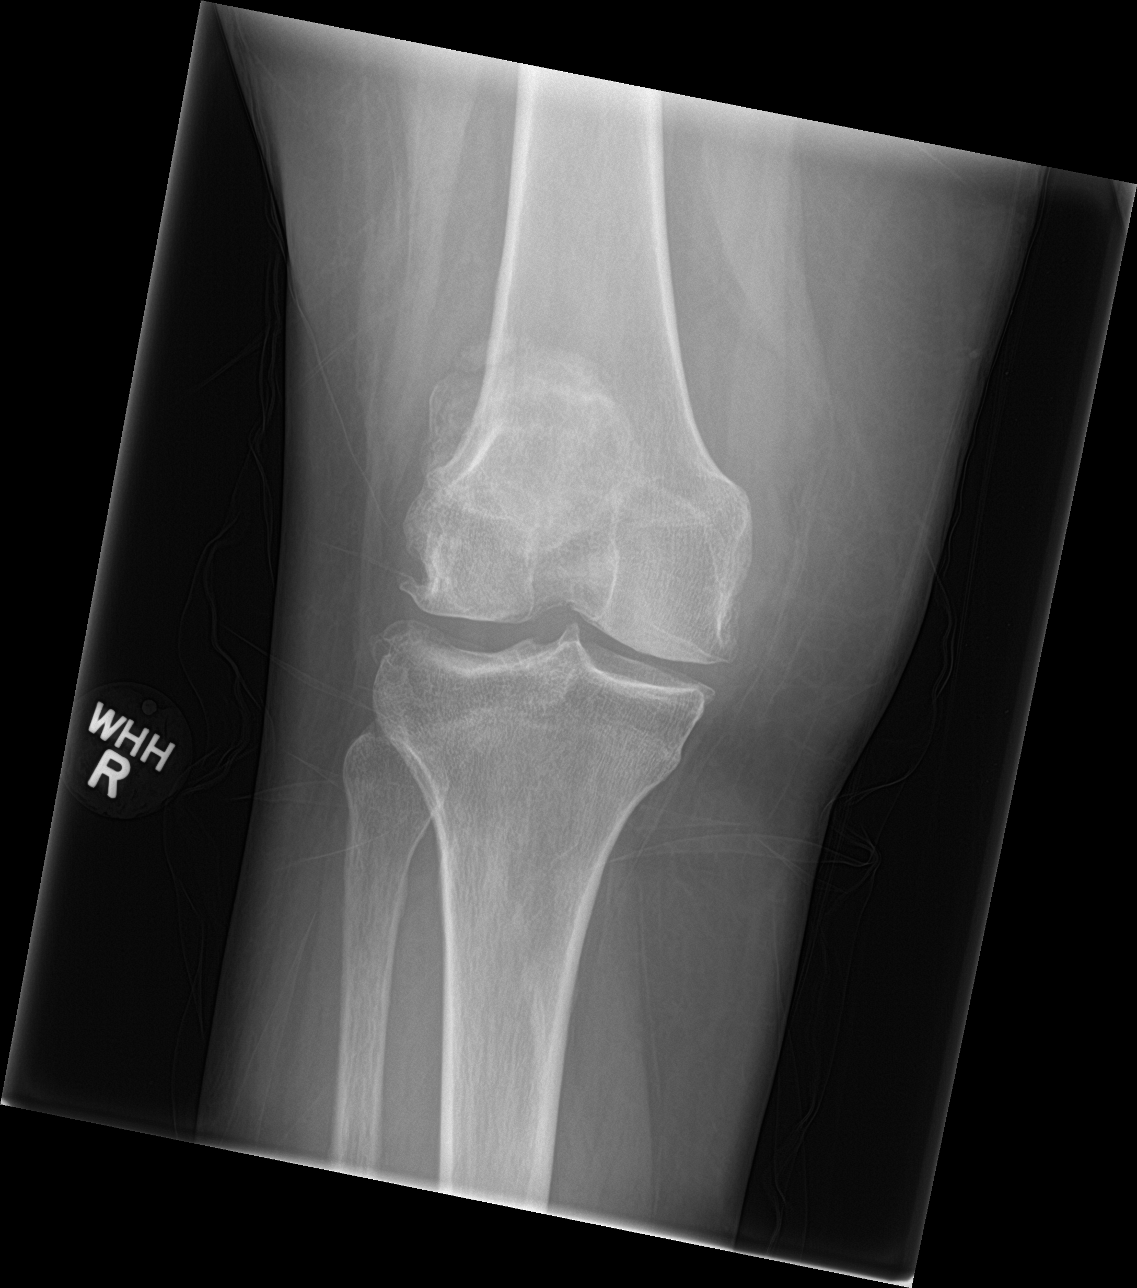

[knee lat]
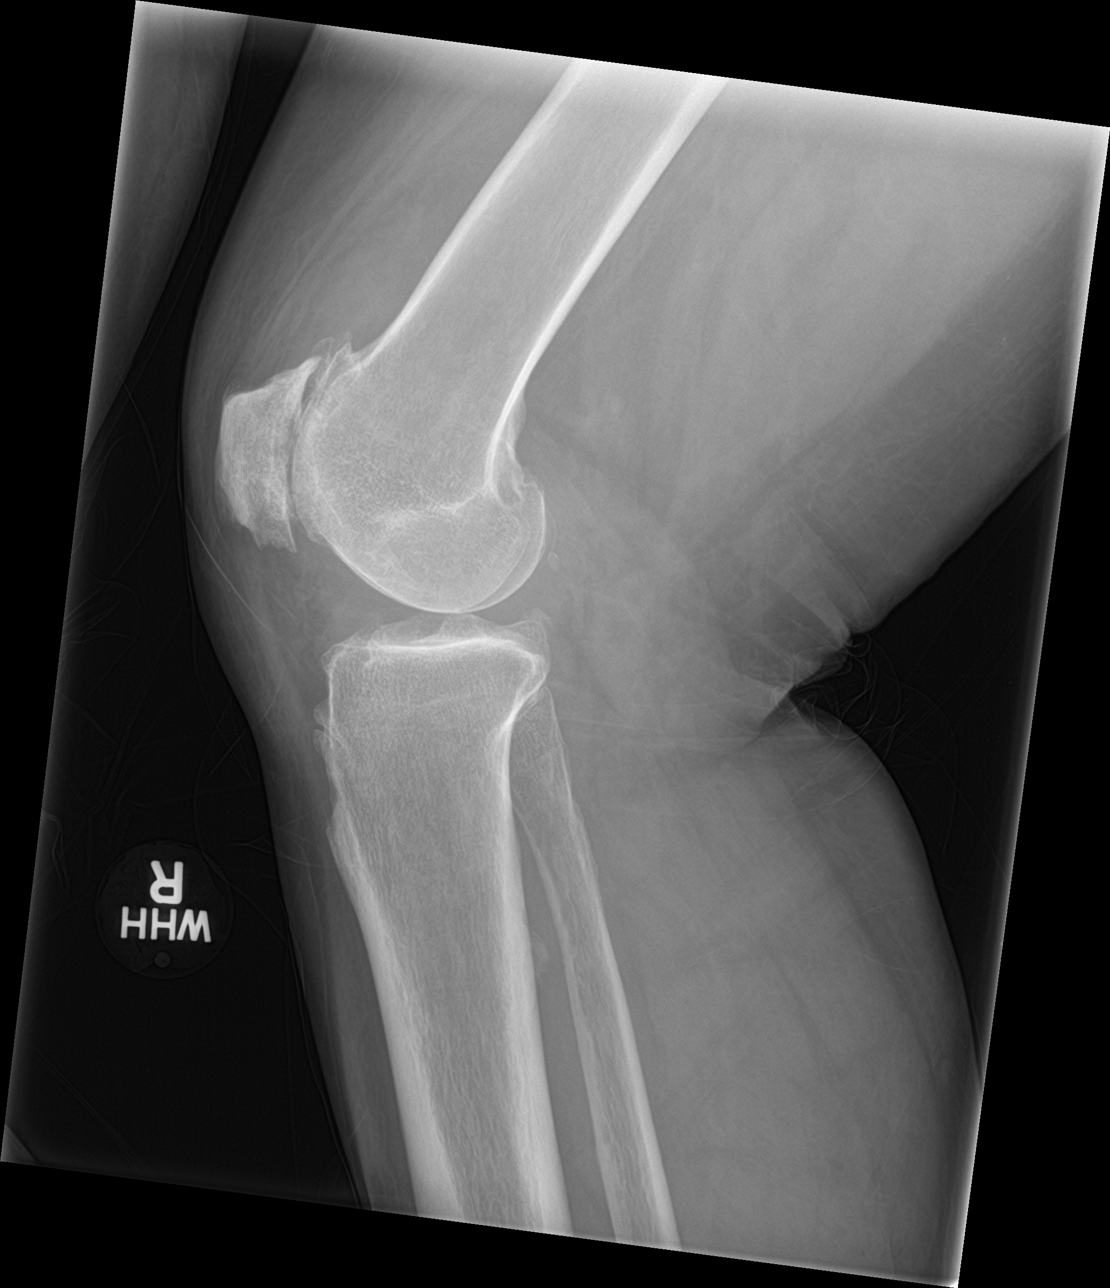

[knee ap (2 of 3)]
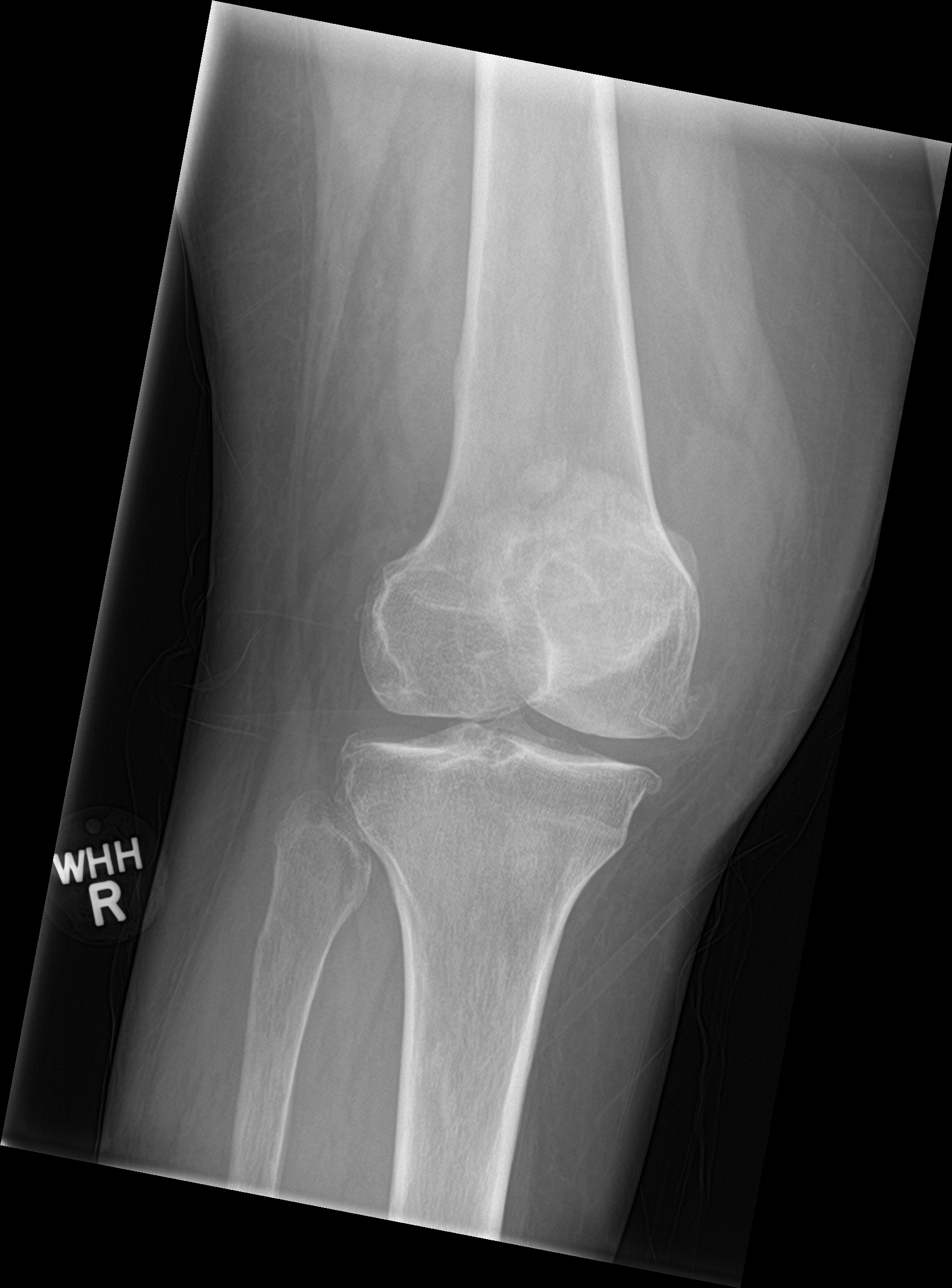

[knee ap (3 of 3)]
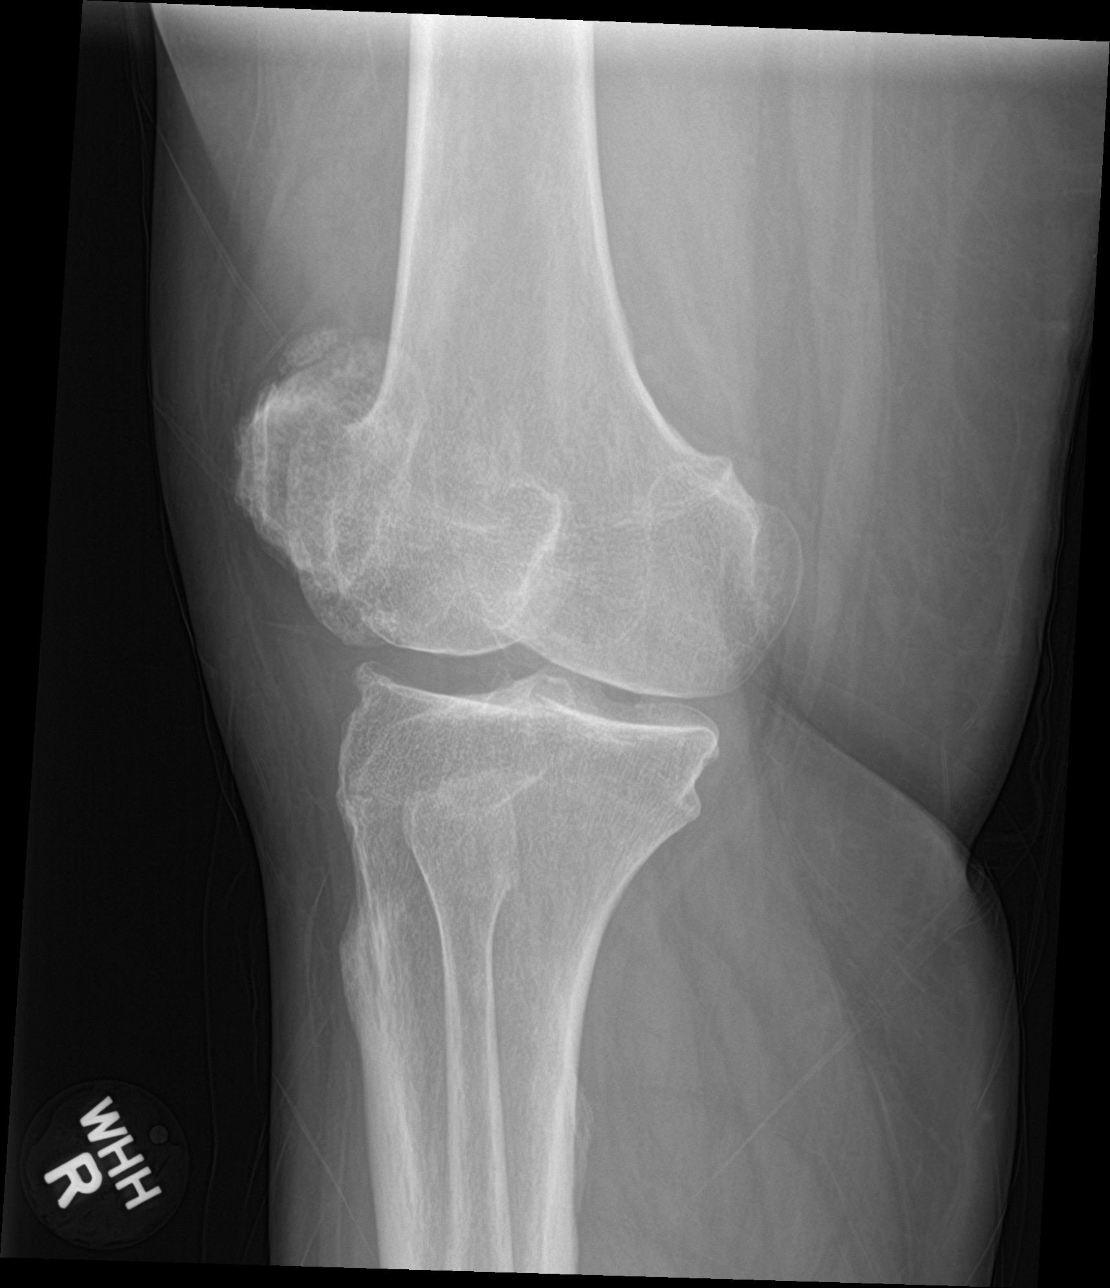

[4 of 4 positions shown; findings below may reference images not displayed]

FINDINGS: There are large patellofemoral and lateral femorotibial osteophytes.
The femorotibial joint space is preserved. No knee effusion. No
acute fracture or dislocation.
IMPRESSION: Moderate right knee osteoarthrosis without acute abnormality.

## 2020-08-24 ENCOUNTER — Other Ambulatory Visit: Payer: Medicaid Other

## 2020-08-24 DIAGNOSIS — Z20822 Contact with and (suspected) exposure to covid-19: Secondary | ICD-10-CM

## 2020-08-26 LAB — NOVEL CORONAVIRUS, NAA: SARS-CoV-2, NAA: DETECTED — AB

## 2020-08-26 LAB — SARS-COV-2, NAA 2 DAY TAT

## 2020-08-31 ENCOUNTER — Emergency Department (HOSPITAL_COMMUNITY): Payer: Medicaid Other

## 2020-08-31 ENCOUNTER — Encounter (HOSPITAL_COMMUNITY): Payer: Self-pay

## 2020-08-31 ENCOUNTER — Emergency Department (HOSPITAL_COMMUNITY)
Admission: EM | Admit: 2020-08-31 | Discharge: 2020-08-31 | Disposition: A | Discharge status: Home / Self Care | Payer: Medicaid Other | Attending: Emergency Medicine | Admitting: Emergency Medicine

## 2020-08-31 ENCOUNTER — Other Ambulatory Visit: Payer: Self-pay

## 2020-08-31 DIAGNOSIS — U071 COVID-19: Secondary | ICD-10-CM | POA: Diagnosis not present

## 2020-08-31 DIAGNOSIS — F17210 Nicotine dependence, cigarettes, uncomplicated: Secondary | ICD-10-CM | POA: Insufficient documentation

## 2020-08-31 DIAGNOSIS — R519 Headache, unspecified: Secondary | ICD-10-CM

## 2020-08-31 DIAGNOSIS — R112 Nausea with vomiting, unspecified: Secondary | ICD-10-CM | POA: Insufficient documentation

## 2020-08-31 LAB — CBC WITH DIFFERENTIAL/PLATELET
Abs Immature Granulocytes: 0.02 10*3/uL (ref 0.00–0.07)
Basophils Absolute: 0 10*3/uL (ref 0.0–0.1)
Basophils Relative: 0 %
Eosinophils Absolute: 0.1 10*3/uL (ref 0.0–0.5)
Eosinophils Relative: 1 %
HCT: 43.3 % (ref 36.0–46.0)
Hemoglobin: 14.1 g/dL (ref 12.0–15.0)
Immature Granulocytes: 0 %
Lymphocytes Relative: 29 %
Lymphs Abs: 1.5 10*3/uL (ref 0.7–4.0)
MCH: 32 pg (ref 26.0–34.0)
MCHC: 32.6 g/dL (ref 30.0–36.0)
MCV: 98.4 fL (ref 80.0–100.0)
Monocytes Absolute: 0.4 10*3/uL (ref 0.1–1.0)
Monocytes Relative: 8 %
Neutro Abs: 3.2 10*3/uL (ref 1.7–7.7)
Neutrophils Relative %: 62 %
Platelets: 252 10*3/uL (ref 150–400)
RBC: 4.4 MIL/uL (ref 3.87–5.11)
RDW: 15.1 % (ref 11.5–15.5)
WBC: 5.2 10*3/uL (ref 4.0–10.5)
nRBC: 0 % (ref 0.0–0.2)

## 2020-08-31 LAB — HCG, QUANTITATIVE, PREGNANCY: hCG, Beta Chain, Quant, S: <1 m[IU]/mL (ref <5)

## 2020-08-31 LAB — URINALYSIS, ROUTINE W REFLEX MICROSCOPIC
Bacteria, UA: NONE SEEN
Bilirubin Urine: NEGATIVE
Glucose, UA: NEGATIVE mg/dL
Ketones, ur: NEGATIVE mg/dL
Leukocytes,Ua: NEGATIVE
Nitrite: NEGATIVE
Protein, ur: NEGATIVE mg/dL
Specific Gravity, Urine: 1.005 (ref 1.005–1.030)
pH: 5 (ref 5.0–8.0)

## 2020-08-31 LAB — COMPREHENSIVE METABOLIC PANEL
ALT: 54 U/L — ABNORMAL HIGH (ref 0–44)
AST: 43 U/L — ABNORMAL HIGH (ref 15–41)
Albumin: 3.7 g/dL (ref 3.5–5.0)
Alkaline Phosphatase: 89 U/L (ref 38–126)
Anion gap: 14 (ref 5–15)
BUN: <5 mg/dL — ABNORMAL LOW (ref 6–20)
CO2: 21 mmol/L — ABNORMAL LOW (ref 22–32)
Calcium: 8.9 mg/dL (ref 8.9–10.3)
Chloride: 104 mmol/L (ref 98–111)
Creatinine, Ser: 0.66 mg/dL (ref 0.44–1.00)
GFR, Estimated: >60 mL/min (ref >60)
Glucose, Bld: 71 mg/dL (ref 70–99)
Potassium: 3.9 mmol/L (ref 3.5–5.1)
Sodium: 139 mmol/L (ref 135–145)
Total Bilirubin: 1.1 mg/dL (ref 0.3–1.2)
Total Protein: 7.5 g/dL (ref 6.5–8.1)

## 2020-08-31 LAB — LIPASE, BLOOD: Lipase: 24 U/L (ref 11–51)

## 2020-08-31 MED ORDER — ONDANSETRON 4 MG PO TBDP
4.0000 mg | ORAL_TABLET | Freq: Once | ORAL | Status: AC
  Administered 2020-08-31: 4 mg via ORAL
  Filled 2020-08-31: qty 1

## 2020-08-31 MED ORDER — PROCHLORPERAZINE EDISYLATE 10 MG/2ML IJ SOLN
10.0000 mg | Freq: Once | INTRAMUSCULAR | Status: AC
  Administered 2020-08-31: 10 mg via INTRAVENOUS
  Filled 2020-08-31: qty 2

## 2020-08-31 MED ORDER — ONDANSETRON 4 MG PO TBDP: 4.0000 mg | ORAL_TABLET | Freq: Three times a day (TID) | ORAL | 0 refills | Status: DC | PRN

## 2020-08-31 MED ORDER — KETOROLAC TROMETHAMINE 30 MG/ML IJ SOLN
15.0000 mg | Freq: Once | INTRAMUSCULAR | Status: AC
  Administered 2020-08-31: 15 mg via INTRAVENOUS
  Filled 2020-08-31: qty 1

## 2020-08-31 MED ORDER — SODIUM CHLORIDE 0.9 % IV BOLUS
1000.0000 mL | Freq: Once | INTRAVENOUS | Status: AC
  Administered 2020-08-31: 1000 mL via INTRAVENOUS

## 2020-08-31 MED ORDER — ACETAMINOPHEN 500 MG PO TABS
1000.0000 mg | ORAL_TABLET | Freq: Once | ORAL | Status: AC
  Administered 2020-08-31: 1000 mg via ORAL
  Filled 2020-08-31: qty 2

## 2020-08-31 MED ORDER — DEXAMETHASONE SODIUM PHOSPHATE 10 MG/ML IJ SOLN
10.0000 mg | Freq: Once | INTRAMUSCULAR | Status: AC
  Administered 2020-08-31: 10 mg via INTRAVENOUS
  Filled 2020-08-31: qty 1

## 2020-08-31 MED ORDER — DIPHENHYDRAMINE HCL 50 MG/ML IJ SOLN
50.0000 mg | Freq: Once | INTRAMUSCULAR | Status: AC
  Administered 2020-08-31: 50 mg via INTRAVENOUS
  Filled 2020-08-31: qty 1

## 2020-08-31 NOTE — ED Notes (Signed)
Pt transport to CT by CT staff via stretcher at this time.  This RN attempted IV placement x2 without success.

## 2020-08-31 NOTE — ED Notes (Signed)
Lab at bedside

## 2020-08-31 NOTE — ED Notes (Signed)
PA at bedside at this time.  

## 2020-08-31 NOTE — ED Provider Notes (Signed)
[facility] EMERGENCY DEPARTMENT Provider Note   CSN: [id number] Arrival date & time: 08/31/20  1342     History Chief Complaint  Patient presents with  . Emesis    Tal Neer is a 40 y.o. female with a history significant for distant pulmonary embolus, alcohol abuse, chronic peripheral neuropathy presenting for evaluation of generalized body aches headache and multiple episodes of vomiting yesterday, today just endorses nausea without emesis.  She has had subjective fever, denies chest pain, shortness of breath but has had a dry cough.  Also denies abdominal pain and has had no diarrhea.  Headache has been constant since yesterday, associated with photophobia.  She does endorse a history of migraine headaches when teenager, which she grew out of in adulthood.  She denies neck pain or stiffness.  She feels dehydrated, states she has had reduced urine output today.  She reports visiting a family member just before Christmas who felt unwell, and had tested negative for COVID-19, but then the following week retested and was positive.  She states she is was screened as well early last week when she was asymptomatic and her COVID test was negative.  She has not been COVID vaccinated.  She is concerned she may have this infection.  She has tried Tylenol without relief of her headache. HPI     Past Medical History:  Diagnosis Date  . Neuropathy 01/09/2019  . Pulmonary embolism [facility])     Patient Active Problem List   Diagnosis Date Noted  . Supratherapeutic INR 06/02/2019  . Leukocytosis 01/09/2019  . Alcohol abuse 01/09/2019  . Elevated LFTs 01/09/2019  . Hepatic steatosis 01/09/2019  . Alcohol dependence with uncomplicated withdrawal (HCC) 01/09/2019  . Alcohol withdrawal (HCC) 01/09/2019  . Vitamin D deficiency 01/09/2019  . Folic acid deficiency 01/09/2019  . Non-sustained ventricular tachycardia (HCC) 01/09/2019  . Lactic acidosis 01/09/2019  . Pulmonary embolism (HCC)  01/09/2019  . Acute metabolic encephalopathy 01/09/2019  . Closed fracture of distal end of left humerus 04/29/18 05/06/2018    Past Surgical History:  Procedure Laterality Date  . GASTRIC BYPASS    . TONSILLECTOMY       OB History    Gravida      Para      Term      Preterm      AB      Living  3     SAB      IAB      Ectopic      Multiple      Live Births              Family History  Problem Relation Age of Onset  . Chronic infections Mother   . Diabetes Mother   . Healthy Father   . High blood pressure Maternal Grandfather   . Diabetes Maternal Grandfather   . Diabetes Paternal Grandmother   . Cancer Paternal Grandmother     Social History   Tobacco Use  . Smoking status: Current Some Day Smoker    Packs/day: 0.50    Types: Cigarettes  . Smokeless tobacco: Never Used  . Tobacco comment: 4 cigarettes daily when she does smoke  Vaping Use  . Vaping Use: Never used  Substance Use Topics  . Alcohol use: Yes    Comment: occ  . Drug use: No    Home Medications Prior to Admission medications   Medication Sig Start Date End Date Taking? Authorizing Provider  ondansetron (ZOFRAN ODT) 4 MG  disintegrating tablet Take 1 tablet (4 mg total) by mouth every 8 (eight) hours as needed for nausea or vomiting. 08/31/20  Yes Parvin Stetzer, Raynelle Fanning, PA-C  acetaminophen (TYLENOL) 500 MG tablet Take 1,000 mg by mouth every 6 (six) hours as needed for mild pain or moderate pain.     [provider]  amLODipine (NORVASC) 10 MG tablet Take 1 tablet (10 mg total) by mouth daily for 30 days. 01/15/19 06/10/19  Hughie Closs, MD  gabapentin (NEURONTIN) 300 MG capsule Take 1 capsule (300 mg total) by mouth 3 (three) times daily. 09/15/19 10/15/19  Namon Cirri E, PA-C  lidocaine (XYLOCAINE) 2 % solution Use as directed 15 mLs in the mouth or throat every 6 (six) hours as needed for mouth pain. 09/15/19   Walisiewicz, Yvonna Alanis E, PA-C  metoprolol tartrate (LOPRESSOR) 25  MG tablet Take 0.5 tablets (12.5 mg total) by mouth 2 (two) times daily for 30 days. 01/15/19 06/10/19  Hughie Closs, MD  Multiple Vitamin (MULTIVITAMIN WITH MINERALS) TABS tablet Take 1 tablet by mouth daily.    [provider]  warfarin (COUMADIN) 1 MG tablet Take 1 tablet (1 mg total) by mouth daily. 06/03/19 07/03/19  Sherryll Burger, Pratik D, DO    Allergies    Lovenox  [enoxaparin sodium] and Ibuprofen  Review of Systems   Review of Systems  Constitutional: Positive for fatigue and fever.  HENT: Negative for congestion and sore throat.   Eyes: Positive for photophobia.  Respiratory: Positive for cough. Negative for chest tightness and shortness of breath.   Cardiovascular: Negative for chest pain.  Gastrointestinal: Positive for nausea and vomiting. Negative for abdominal pain.  Genitourinary: Negative.   Musculoskeletal: Positive for myalgias. Negative for arthralgias, joint swelling, neck pain and neck stiffness.  Skin: Negative.  Negative for rash and wound.  Neurological: Positive for headaches. Negative for dizziness, weakness, light-headedness and numbness.  Psychiatric/Behavioral: Negative.   All other systems reviewed and are negative.   Physical Exam Updated Vital Signs BP (!) 139/95   Pulse 98   Temp 99 F (37.2 C) (Oral)   Resp 19   Ht 5\' 2"  (1.575 m)   Wt 90.3 kg   SpO2 96%   BMI 36.40 kg/m   Physical Exam Vitals and nursing note reviewed.  Constitutional:      Appearance: She is well-developed and well-nourished.     Comments: Resting in darkened room.  HENT:     Head: Normocephalic and atraumatic.     Right Ear: Tympanic membrane normal.     Left Ear: Tympanic membrane normal.     Mouth/Throat:     Mouth: Oropharynx is clear and moist. Mucous membranes are moist.     Pharynx: Oropharynx is clear.  Eyes:     Extraocular Movements: Extraocular movements intact and EOM normal.     Conjunctiva/sclera: Conjunctivae normal.     Pupils: Pupils are  equal, round, and reactive to light.  Cardiovascular:     Rate and Rhythm: Normal rate.     Heart sounds: Normal heart sounds.  Pulmonary:     Effort: Pulmonary effort is normal.  Abdominal:     Palpations: Abdomen is soft.     Tenderness: There is no abdominal tenderness. There is no guarding or rebound.  Musculoskeletal:        General: Normal range of motion.     Cervical back: Normal range of motion and neck supple. No rigidity.  Lymphadenopathy:     Cervical: No cervical adenopathy.  Skin:  General: Skin is warm and dry.     Findings: No rash.  Neurological:     General: No focal deficit present.     Mental Status: She is alert and oriented to person, place, and time.     GCS: GCS eye subscore is 4. GCS verbal subscore is 5. GCS motor subscore is 6.     Sensory: No sensory deficit.     Gait: Gait normal.     Deep Tendon Reflexes: Strength normal.     Comments: Normal heel-shin, normal rapid alternating movements. Cranial nerves III-XII intact.  No pronator drift.  Psychiatric:        Mood and Affect: Mood and affect normal.        Speech: Speech normal.        Behavior: Behavior normal.        Thought Content: Thought content normal.        Cognition and Memory: Cognition and memory normal.     ED Results / Procedures / Treatments   Labs (all labs ordered are listed, but only abnormal results are displayed) Labs Reviewed  COMPREHENSIVE METABOLIC PANEL - Abnormal; Notable for the following components:      Result Value   CO2 21 (*)    BUN <5 (*)    AST 43 (*)    ALT 54 (*)    All other components within normal limits  URINALYSIS, ROUTINE W REFLEX MICROSCOPIC - Abnormal; Notable for the following components:   Color, Urine STRAW (*)    APPearance HAZY (*)    Hgb urine dipstick SMALL (*)    All other components within normal limits  CBC WITH DIFFERENTIAL/PLATELET  LIPASE, BLOOD  HCG, QUANTITATIVE, PREGNANCY    EKG None  Radiology CT Head Wo  Contrast  Result Date: 08/31/2020 CLINICAL DATA:  Headache.  COVID-19. EXAM: CT HEAD WITHOUT CONTRAST TECHNIQUE: Contiguous axial images were obtained from the base of the skull through the vertex without intravenous contrast. COMPARISON:  None. FINDINGS: Brain: There is no mass, hemorrhage or extra-axial collection. The size and configuration of the ventricles and extra-axial CSF spaces are normal. The brain parenchyma is normal, without acute or chronic infarction. Vascular: No abnormal hyperdensity of the major intracranial arteries or dural venous sinuses. No intracranial atherosclerosis. Skull: The visualized skull base, calvarium and extracranial soft tissues are normal. Sinuses/Orbits: No fluid levels or advanced mucosal thickening of the visualized paranasal sinuses. No mastoid or middle ear effusion. The orbits are normal. IMPRESSION: Normal head CT. Electronically Signed   By: Deatra Robinson M.D.   On: 08/31/2020 19:29   DG Chest Portable 1 View  Result Date: 08/31/2020 CLINICAL DATA:  COVID-19 positive with weakness and vomiting EXAM: PORTABLE CHEST 1 VIEW COMPARISON:  September 15, 2019 FINDINGS: The lungs are clear. The heart size and pulmonary vascularity are normal. No adenopathy. No bone lesions. IMPRESSION: Lungs clear.  Cardiac silhouette normal. Electronically Signed   By: Bretta Bang III M.D.   On: 08/31/2020 16:18    Procedures Procedures (including critical care time)  Medications Ordered in ED Medications  ketorolac (TORADOL) 30 MG/ML injection 15 mg (has no administration in time range)  ondansetron (ZOFRAN-ODT) disintegrating tablet 4 mg (4 mg Oral Given 08/31/20 1633)  acetaminophen (TYLENOL) tablet 1,000 mg (1,000 mg Oral Given 08/31/20 1634)  sodium chloride 0.9 % bolus 1,000 mL (1,000 mLs Intravenous New Bag/Given 08/31/20 2049)  dexamethasone (DECADRON) injection 10 mg (10 mg Intravenous Given 08/31/20 2053)  prochlorperazine (COMPAZINE) injection 10  mg (10 mg  Intravenous Given 08/31/20 2051)  diphenhydrAMINE (BENADRYL) injection 50 mg (50 mg Intravenous Given 08/31/20 2049)    ED Course  I have reviewed the triage vital signs and the nursing notes.  Pertinent labs & imaging results that were available during my care of the patient were reviewed by me and considered in my medical decision making (see chart for details).    MDM Rules/Calculators/A&P                          Review of chart indicates that patient was COVID tested on January 6 and was positive for COVID-19.  This information was relayed to the patient.  She apparently missed read the results on her MyChart app.  She initially was given a trial of p.o. fluids after giving Tylenol and Zofran.  She was able to tolerate the p.o. challenge but had persistent fairly severe headache which was not improving with the Tylenol.  An IV line was started and she was given a liter of saline, also was given a migraine cocktail including Benadryl, Compazine and Decadron.  Her headache was improving, but still with complaints of generalized body aches, still no nausea or vomiting, added Toradol per IV for additional symptom relief.  CT head was ordered given persistence of her headache and is negative for any acute findings.  Patient has technically completed her quarantine.  As her COVID test was +7 days ago.  She was encouraged to remain home until her symptoms are improving however.  She was given a prescription for Zofran, strict return precautions were outlined.   Stacy [name] was evaluated in Emergency Department on 08/31/2020 for the symptoms described in the history of present illness. She was evaluated in the context of the global COVID-19 pandemic, which necessitated consideration that the patient might be at risk for infection with the SARS-CoV-2 virus that causes COVID-19. Institutional protocols and algorithms that pertain to the evaluation of patients at risk for COVID-19 are in a state of  rapid change based on information released by regulatory bodies including the CDC and federal and state organizations. These policies and algorithms were followed during the patient's care in the ED.  Final Clinical Impression(s) / ED Diagnoses Final diagnoses:  COVID-19  Non-intractable vomiting with nausea, unspecified vomiting type    Rx / DC Orders ED Discharge Orders         Ordered    ondansetron (ZOFRAN ODT) 4 MG disintegrating tablet  Every 8 hours PRN        08/31/20 1837           Victoriano Laindol, Galo Sayed, PA-C 08/31/20 2128    Eber HongMiller, Brian, MD 09/01/20 1505

## 2020-08-31 NOTE — ED Notes (Signed)
PA at bedside.

## 2020-08-31 NOTE — ED Notes (Signed)
X Ray at bedside at this time.  

## 2020-08-31 NOTE — ED Notes (Signed)
Entered room and introduced self to patient. Pt appears to be resting in bed, respirations are even and unlabored with equal chest rise and fall. Bed is locked in the lowest position, side rails x2, call bell within reach. Pt educated on call light use and hourly rounding, verbalized understanding and in agreement at this time. All questions and concerns voiced addressed. Refreshments offered and provided per patient request.  

## 2020-08-31 NOTE — ED Notes (Addendum)
Pt up and to restroom at this time.   Pt provided with a glass of water for PO trial at this time. Reports a slight improvement with nausea and will attempt a PO trial. This RN verbalized understanding.

## 2020-08-31 NOTE — ED Triage Notes (Signed)
Pt reports body aches, HA and vomiting yesterday

## 2020-08-31 NOTE — ED Notes (Signed)
Attempted x 2 for IV access without success.  

## 2020-08-31 NOTE — Discharge Instructions (Addendum)
Your covid test completed on January 6 is positive for covid which explains your symptoms.    Rest, take tylenol  for body aches.  Use the zofran if needed for nausea or vomiting.  Get rechecked if you develop any worsened symptoms, especially shortness of breath or weakness.   You should remain home in quarantine until your symptoms are improving as discussed. You technically have passed the quarantine phase since it has been 7 days since your positive Covid test.      Person Under Monitoring Name: Stacy [name]  Location: [address] [place] [address]   Infection Prevention Recommendations for Individuals Confirmed to have, or Being Evaluated for, 2019 Novel Coronavirus (COVID-19) Infection Who Receive Care at Home  Individuals who are confirmed to have, or are being evaluated for, COVID-19 should follow the prevention steps below until a healthcare provider or local or state health department says they can return to normal activities.  Stay home except to get medical care You should restrict activities outside your home, except for getting medical care. Do not go to work, school, or public areas, and do not use public transportation or taxis.  Call ahead before visiting your doctor Before your medical appointment, call the healthcare provider and tell them that you have, or are being evaluated for, COVID-19 infection. This will help the healthcare providers office take steps to keep other people from getting infected. Ask your healthcare provider to call the local or state health department.  Monitor your symptoms Seek prompt medical attention if your illness is worsening (e.g., difficulty breathing). Before going to your medical appointment, call the healthcare provider and tell them that you have, or are being evaluated for, COVID-19 infection. Ask your healthcare provider to call the local or state health department.  Wear a facemask You should wear a facemask that  covers your nose and mouth when you are in the same room with other people and when you visit a healthcare provider. People who live with or visit you should also wear a facemask while they are in the same room with you.  Separate yourself from other people in your home As much as possible, you should stay in a different room from other people in your home. Also, you should use a separate bathroom, if available.  Avoid sharing household items You should not share dishes, drinking glasses, cups, eating utensils, towels, bedding, or other items with other people in your home. After using these items, you should wash them thoroughly with soap and water.  Cover your coughs and sneezes Cover your mouth and nose with a tissue when you cough or sneeze, or you can cough or sneeze into your sleeve. Throw used tissues in a lined trash can, and immediately wash your hands with soap and water for at least 20 seconds or use an alcohol-based hand rub.  Wash your [employer] your hands often and thoroughly with soap and water for at least 20 seconds. You can use an alcohol-based hand sanitizer if soap and water are not available and if your hands are not visibly dirty. Avoid touching your eyes, nose, and mouth with unwashed hands.   Prevention Steps for Caregivers and Household Members of Individuals Confirmed to have, or Being Evaluated for, COVID-19 Infection Being Cared for in the Home  If you live with, or provide care at home for, a person confirmed to have, or being evaluated for, COVID-19 infection please follow these guidelines to prevent infection:  Follow healthcare providers  instructions Make sure that you understand and can help the patient follow any healthcare provider instructions for all care.  Provide for the patients basic needs You should help the patient with basic needs in the home and provide support for getting groceries, prescriptions, and other personal needs.  Monitor  the patients symptoms If they are getting sicker, call his or her medical provider and tell them that the patient has, or is being evaluated for, COVID-19 infection. This will help the healthcare providers office take steps to keep other people from getting infected. Ask the healthcare provider to call the local or state health department.  Limit the number of people who have contact with the patient If possible, have only one caregiver for the patient. Other household members should stay in another home or place of residence. If this is not possible, they should stay in another room, or be separated from the patient as much as possible. Use a separate bathroom, if available. Restrict visitors who do not have an essential need to be in the home.  Keep older adults, very young children, and other sick people away from the patient Keep older adults, very young children, and those who have compromised immune systems or chronic health conditions away from the patient. This includes people with chronic heart, lung, or kidney conditions, diabetes, and cancer.  Ensure good ventilation Make sure that shared spaces in the home have good air flow, such as from an air conditioner or an opened window, weather permitting.  Wash your hands often Wash your hands often and thoroughly with soap and water for at least 20 seconds. You can use an alcohol based hand sanitizer if soap and water are not available and if your hands are not visibly dirty. Avoid touching your eyes, nose, and mouth with unwashed hands. Use disposable paper towels to dry your hands. If not available, use dedicated cloth towels and replace them when they become wet.  Wear a facemask and gloves Wear a disposable facemask at all times in the room and gloves when you touch or have contact with the patients blood, body fluids, and/or secretions or excretions, such as sweat, saliva, sputum, nasal mucus, vomit, urine, or feces.  Ensure the  mask fits over your nose and mouth tightly, and do not touch it during use. Throw out disposable facemasks and gloves after using them. Do not reuse. Wash your hands immediately after removing your facemask and gloves. If your personal clothing becomes contaminated, carefully remove clothing and launder. Wash your hands after handling contaminated clothing. Place all used disposable facemasks, gloves, and other waste in a lined container before disposing them with other household waste. Remove gloves and wash your hands immediately after handling these items.  Do not share dishes, glasses, or other household items with the patient Avoid sharing household items. You should not share dishes, drinking glasses, cups, eating utensils, towels, bedding, or other items with a patient who is confirmed to have, or being evaluated for, COVID-19 infection. After the person uses these items, you should wash them thoroughly with soap and water.  Wash laundry thoroughly Immediately remove and wash clothes or bedding that have blood, body fluids, and/or secretions or excretions, such as sweat, saliva, sputum, nasal mucus, vomit, urine, or feces, on them. Wear gloves when handling laundry from the patient. Read and follow directions on labels of laundry or clothing items and detergent. In general, wash and dry with the warmest temperatures recommended on the label.  Clean all areas  the individual has used often Clean all touchable surfaces, such as counters, tabletops, doorknobs, bathroom fixtures, toilets, phones, keyboards, tablets, and bedside tables, every day. Also, clean any surfaces that may have blood, body fluids, and/or secretions or excretions on them. Wear gloves when cleaning surfaces the patient has come in contact with. Use a diluted bleach solution (e.g., dilute bleach with 1 part bleach and 10 parts water) or a household disinfectant with a label that says EPA-registered for coronaviruses. To make  a bleach solution at home, add 1 tablespoon of bleach to 1 quart (4 cups) of water. For a larger supply, add  cup of bleach to 1 gallon (16 cups) of water. Read labels of cleaning products and follow recommendations provided on product labels. Labels contain instructions for safe and effective use of the cleaning product including precautions you should take when applying the product, such as wearing gloves or eye protection and making sure you have good ventilation during use of the product. Remove gloves and wash hands immediately after cleaning.  Monitor yourself for signs and symptoms of illness Caregivers and household members are considered close contacts, should monitor their health, and will be asked to limit movement outside of the home to the extent possible. Follow the monitoring steps for close contacts listed on the symptom monitoring form.   ? If you have additional questions, contact your local health department or call the epidemiologist on call at [phone] (available 24/7). ? This guidance is subject to change. For the most up-to-date guidance from [facility], please refer to their website: [email or web address]

## 2020-09-01 IMAGING — US ULTRASOUND ABDOMEN LIMITED
1 series · 14 of 25 positions shown · non-contrast
Comparison: None.

CLINICAL DATA: Right upper quadrant pain

EXAM:
ULTRASOUND ABDOMEN LIMITED RIGHT UPPER QUADRANT

[Series 1: ultrasound abdomen limited · 14 of 52 slices shown]
[im 1/52]
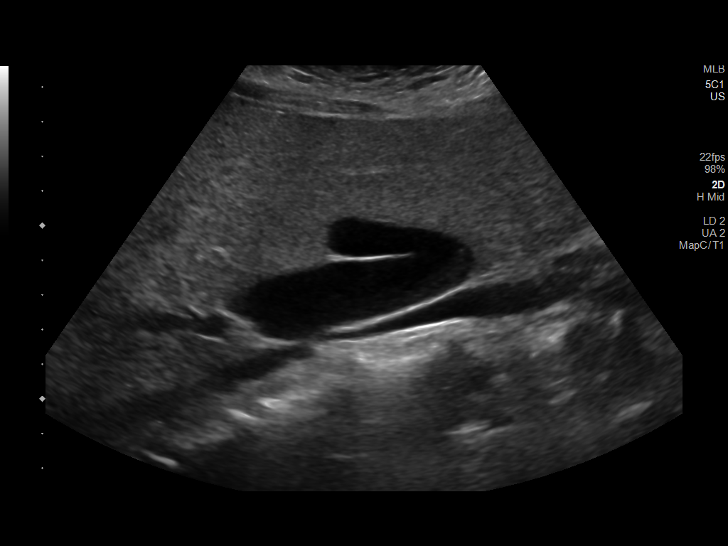
[im 5/52]
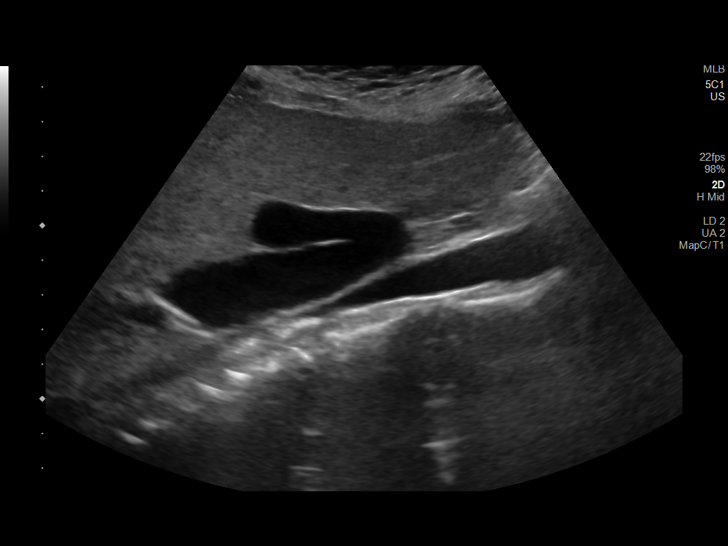
[im 9/52]
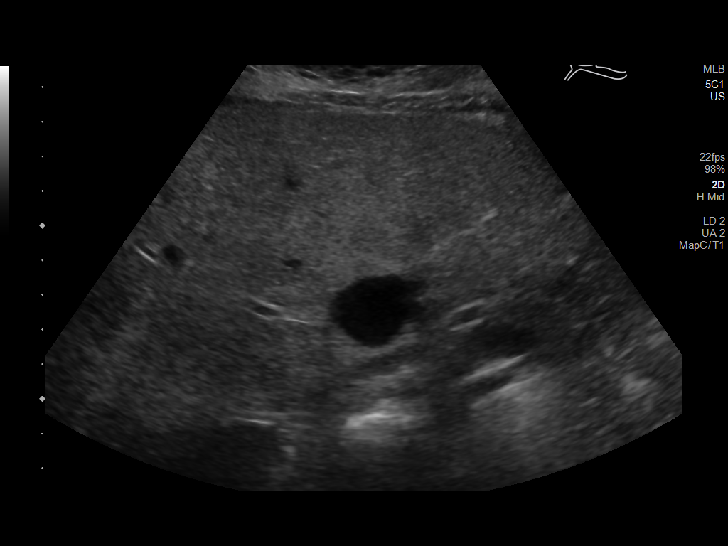
[im 13/52]
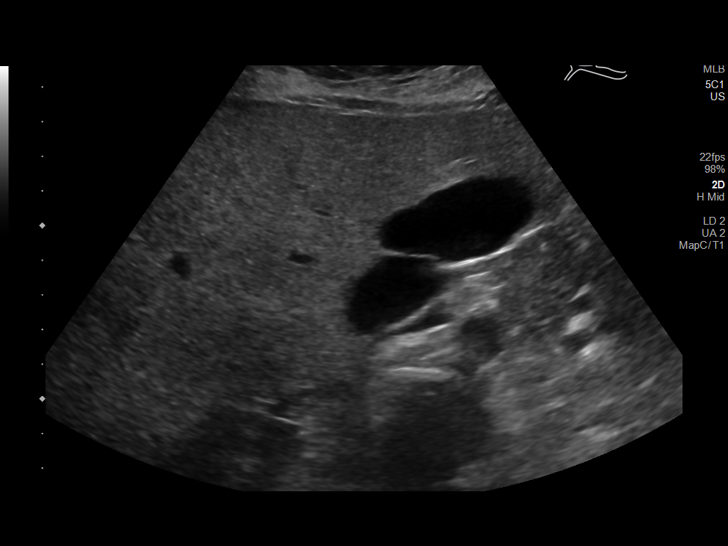
[im 18/52]
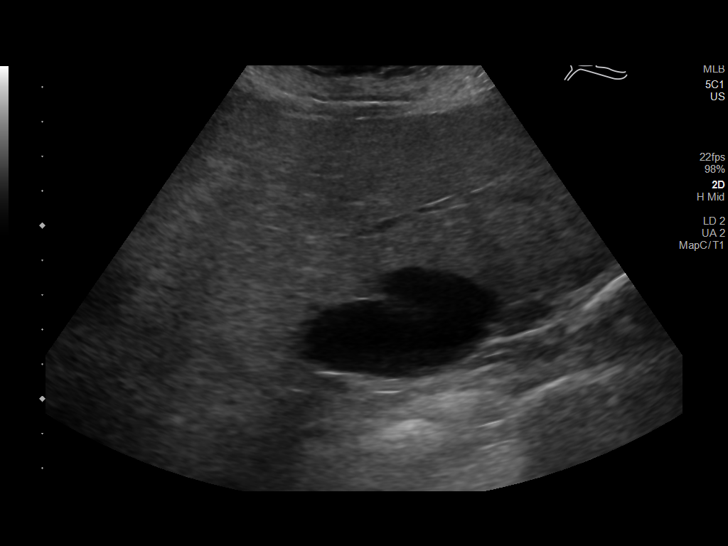
[im 20/52]
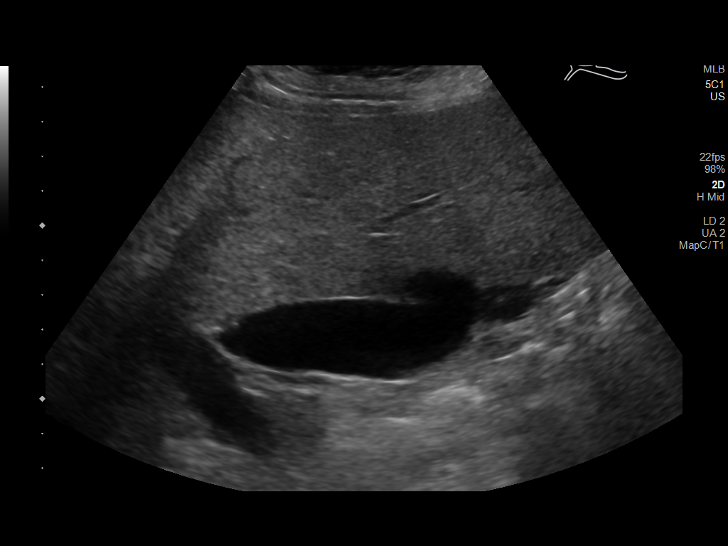
[im 24/52]
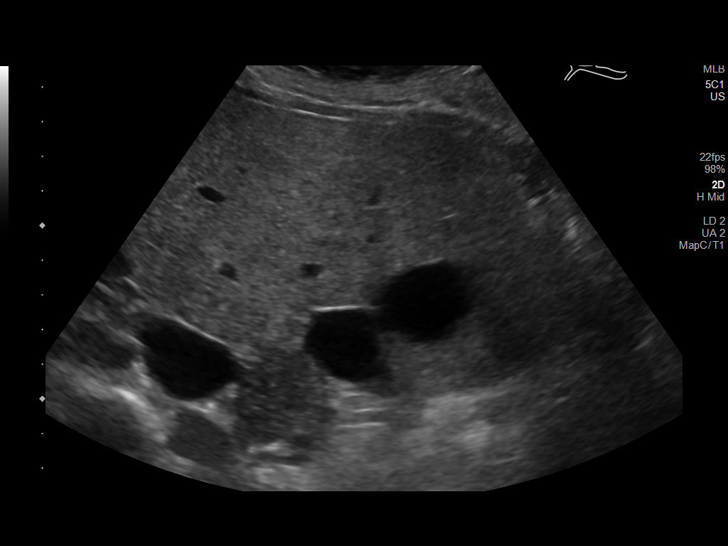
[im 28/52]
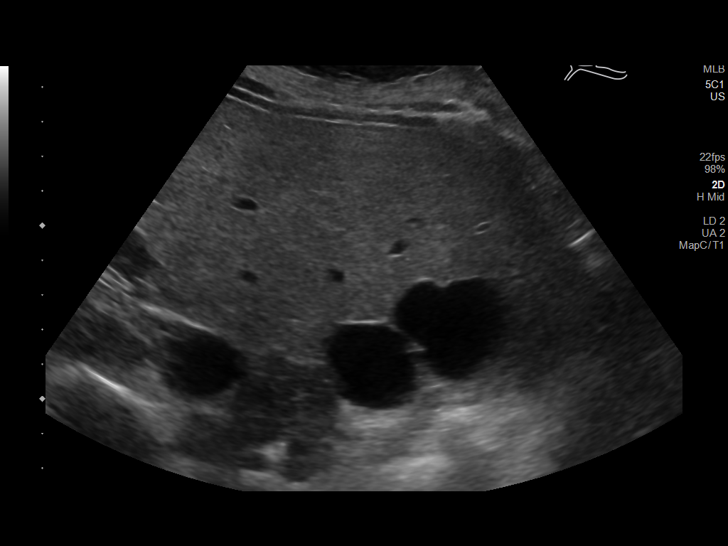
[im 32/52]
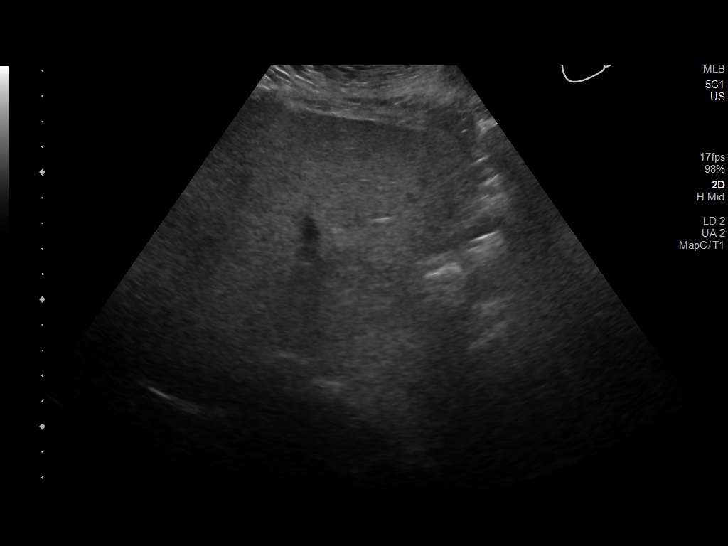
[im 35/52]
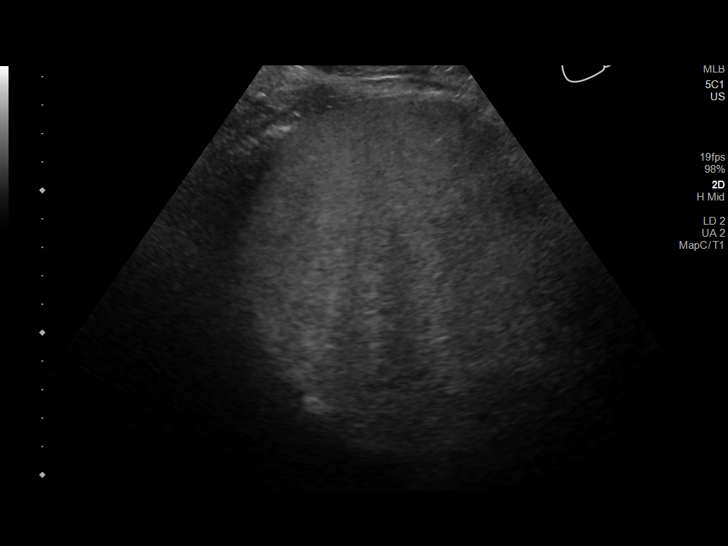
[im 39/52]
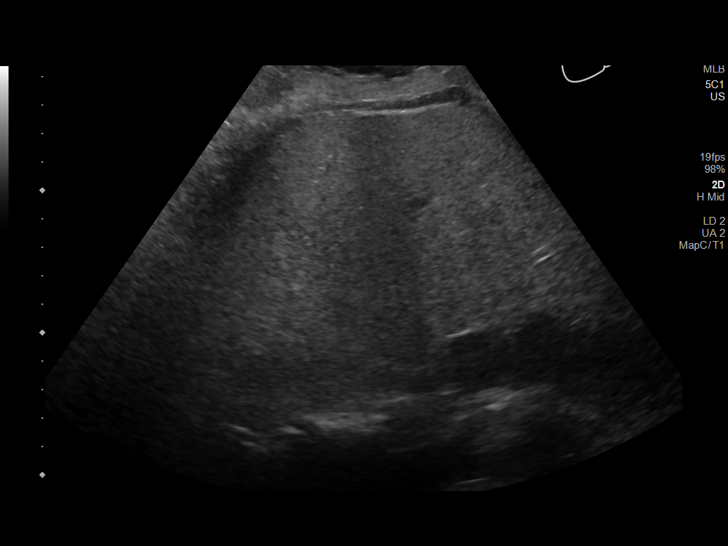
[im 43/52]
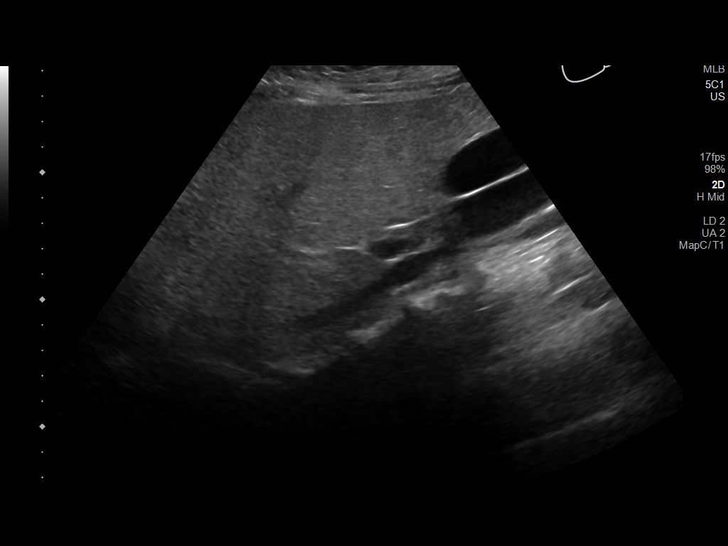
[im 47/52]
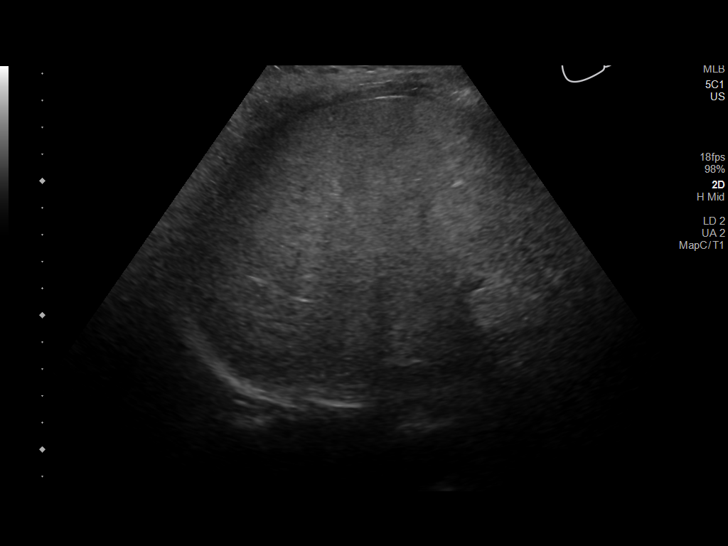
[im 52/52]
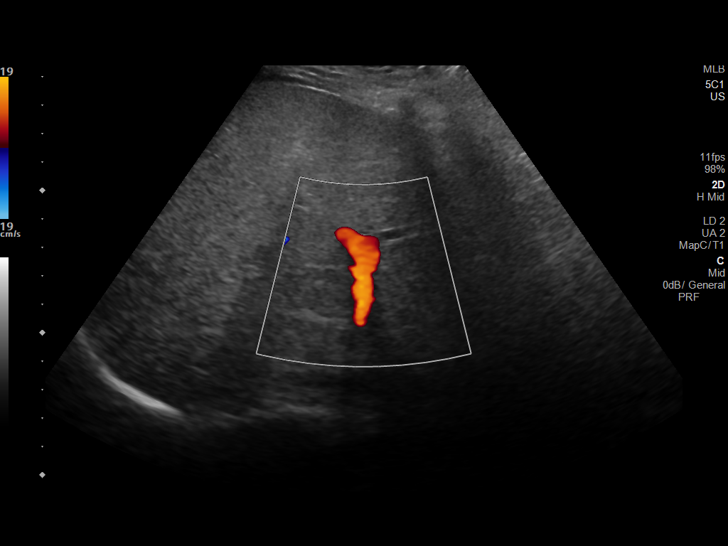

[14 of 25 positions shown; findings below may reference images not displayed]

FINDINGS: Gallbladder:

No gallstones or wall thickening visualized. No sonographic Murphy
sign noted by sonographer.

Common bile duct:

Diameter: 3.9 mm

Liver:

No focal lesion identified. Increased hepatic parenchymal
echogenicity. Portal vein is patent on color Doppler imaging with
normal direction of blood flow towards the liver.
IMPRESSION: 1. No cholelithiasis or sonographic evidence of acute cholecystitis.
2. Increased hepatic echogenicity as can be seen with hepatic
steatosis.

## 2021-06-21 ENCOUNTER — Encounter: Payer: Medicaid Other | Admitting: Obstetrics & Gynecology

## 2021-07-16 ENCOUNTER — Other Ambulatory Visit: Payer: Self-pay

## 2021-07-16 ENCOUNTER — Emergency Department (HOSPITAL_COMMUNITY)
Admission: EM | Admit: 2021-07-16 | Discharge: 2021-07-16 | Disposition: A | Discharge status: Home / Self Care | Payer: Medicaid Other | Attending: Emergency Medicine | Admitting: Emergency Medicine

## 2021-07-16 ENCOUNTER — Encounter (HOSPITAL_COMMUNITY): Payer: Self-pay | Admitting: *Deleted

## 2021-07-16 DIAGNOSIS — Z20822 Contact with and (suspected) exposure to covid-19: Secondary | ICD-10-CM | POA: Diagnosis not present

## 2021-07-16 DIAGNOSIS — R630 Anorexia: Secondary | ICD-10-CM | POA: Diagnosis not present

## 2021-07-16 DIAGNOSIS — H9201 Otalgia, right ear: Secondary | ICD-10-CM | POA: Diagnosis present

## 2021-07-16 DIAGNOSIS — R059 Cough, unspecified: Secondary | ICD-10-CM | POA: Insufficient documentation

## 2021-07-16 DIAGNOSIS — F17210 Nicotine dependence, cigarettes, uncomplicated: Secondary | ICD-10-CM | POA: Insufficient documentation

## 2021-07-16 DIAGNOSIS — J029 Acute pharyngitis, unspecified: Secondary | ICD-10-CM | POA: Insufficient documentation

## 2021-07-16 DIAGNOSIS — H60501 Unspecified acute noninfective otitis externa, right ear: Secondary | ICD-10-CM | POA: Diagnosis not present

## 2021-07-16 LAB — RESP PANEL BY RT-PCR (FLU A&B, COVID) ARPGX2
Influenza A by PCR: NEGATIVE
Influenza B by PCR: NEGATIVE
SARS Coronavirus 2 by RT PCR: NEGATIVE

## 2021-07-16 MED ORDER — AMOXICILLIN 500 MG PO CAPS: 500.0000 mg | ORAL_CAPSULE | Freq: Three times a day (TID) | ORAL | 0 refills | Status: DC

## 2021-07-16 NOTE — ED Provider Notes (Signed)
[facility] EMERGENCY DEPARTMENT Provider Note   CSN: [id number] Arrival date & time: 07/16/21  8469     History No chief complaint on file.   Stacy [name] is a 40 y.o. female.  HPI Patient presents with pain in her right ear with some bloody drainage.  Has had for last few days.  Also had sore throat and decreased appetite.  No fevers but has had body aches and chills.  No definite sick contacts.  Occasional cough.  Some clear sputum production.  No nausea or vomiting.  No diarrhea.  States she has been using some drops in her ear but then more blood started.  Not on blood thinners.    Past Medical History:  Diagnosis Date   Neuropathy 01/09/2019   Pulmonary embolism [facility])     Patient Active Problem List   Diagnosis Date Noted   Supratherapeutic INR 06/02/2019   Leukocytosis 01/09/2019   Alcohol abuse 01/09/2019   Elevated LFTs 01/09/2019   Hepatic steatosis 01/09/2019   Alcohol dependence with uncomplicated withdrawal (HCC) 01/09/2019   Alcohol withdrawal (HCC) 01/09/2019   Vitamin D deficiency 01/09/2019   Folic acid deficiency 01/09/2019   Non-sustained ventricular tachycardia 01/09/2019   Lactic acidosis 01/09/2019   Pulmonary embolism (HCC) 01/09/2019   Acute metabolic encephalopathy 01/09/2019   Closed fracture of distal end of left humerus 04/29/18 05/06/2018    Past Surgical History:  Procedure Laterality Date   GASTRIC BYPASS     TONSILLECTOMY       OB History     Gravida      Para      Term      Preterm      AB      Living  3      SAB      IAB      Ectopic      Multiple      Live Births              Family History  Problem Relation Age of Onset   Chronic infections Mother    Diabetes Mother    Healthy Father    High blood pressure Maternal Grandfather    Diabetes Maternal Grandfather    Diabetes Paternal Grandmother    Cancer Paternal Grandmother     Social History   Tobacco Use   Smoking status: Some Days     Types: Cigarettes   Smokeless tobacco: Never   Tobacco comments:    4 cigarettes daily when she does smoke  Vaping Use   Vaping Use: Never used  Substance Use Topics   Alcohol use: Yes    Comment: occ   Drug use: No    Home Medications Prior to Admission medications   Medication Sig Start Date End Date Taking? Authorizing Provider  amoxicillin (AMOXIL) 500 MG capsule Take 1 capsule (500 mg total) by mouth 3 (three) times daily. 07/16/21  Yes Benjiman Core, MD  acetaminophen (TYLENOL) 500 MG tablet Take 1,000 mg by mouth every 6 (six) hours as needed for mild pain or moderate pain.     [provider]  amLODipine (NORVASC) 10 MG tablet Take 1 tablet (10 mg total) by mouth daily for 30 days. 01/15/19 06/10/19  Hughie Closs, MD  gabapentin (NEURONTIN) 300 MG capsule Take 1 capsule (300 mg total) by mouth 3 (three) times daily. 09/15/19 10/15/19  Namon Cirri E, PA-C  lidocaine (XYLOCAINE) 2 % solution Use as directed 15 mLs in the mouth or throat every  6 (six) hours as needed for mouth pain. 09/15/19   Walisiewicz, Yvonna Alanis E, PA-C  metoprolol tartrate (LOPRESSOR) 25 MG tablet Take 0.5 tablets (12.5 mg total) by mouth 2 (two) times daily for 30 days. 01/15/19 06/10/19  Hughie Closs, MD  Multiple Vitamin (MULTIVITAMIN WITH MINERALS) TABS tablet Take 1 tablet by mouth daily.    [provider]  ondansetron (ZOFRAN ODT) 4 MG disintegrating tablet Take 1 tablet (4 mg total) by mouth every 8 (eight) hours as needed for nausea or vomiting. 08/31/20   Burgess Amor, PA-C  warfarin (COUMADIN) 1 MG tablet Take 1 tablet (1 mg total) by mouth daily. 06/03/19 07/03/19  Sherryll Burger, Pratik D, DO    Allergies    Lovenox  [enoxaparin sodium] and Ibuprofen  Review of Systems   Review of Systems  Constitutional:  Negative for appetite change.  HENT:  Positive for congestion, ear discharge and ear pain.   Respiratory:  Positive for cough. Negative for shortness of breath.    Cardiovascular:  Negative for chest pain.  Gastrointestinal:  Negative for abdominal pain.  Genitourinary:  Negative for flank pain.  Musculoskeletal:  Negative for back pain.  Skin:  Negative for rash.  Neurological:  Negative for weakness.  Psychiatric/Behavioral:  Negative for confusion.    Physical Exam Updated Vital Signs BP (!) 138/98 (BP Location: Right Arm)   Pulse 86   Temp 98 F (36.7 C) (Oral)   Resp 18   Ht 5\' 2"  (1.575 m)   Wt 89.8 kg   SpO2 99%   BMI 36.21 kg/m   Physical Exam Vitals and nursing note reviewed.  HENT:     Head: Atraumatic.     Left Ear: Tympanic membrane normal.     Ears:     Comments: Right external ear has erythema and swelling.  Some mild drainage.  Some areas of bleeding superiorly and inferiorly more along the TM.  Potential perforation TM somewhat difficult to visualize. Pulmonary:     Breath sounds: No rhonchi.  Abdominal:     Tenderness: There is no abdominal tenderness.  Musculoskeletal:        General: No tenderness.     Cervical back: Neck supple.  Skin:    General: Skin is warm.     Capillary Refill: Capillary refill takes less than 2 seconds.  Neurological:     Mental Status: She is alert and oriented to person, place, and time.    ED Results / Procedures / Treatments   Labs (all labs ordered are listed, but only abnormal results are displayed) Labs Reviewed  RESP PANEL BY RT-PCR (FLU A&B, COVID) ARPGX2    EKG None  Radiology No results found.  Procedures Procedures   Medications Ordered in ED Medications - No data to display  ED Course  I have reviewed the triage vital signs and the nursing notes.  Pertinent labs & imaging results that were available during my care of the patient were reviewed by me and considered in my medical decision making (see chart for details).    MDM Rules/Calculators/A&P                           Patient with URI symptoms.  Negative COVID and flu testing.  Also ear pain with  some drainage.  Some swelling that appears to be an otitis externa but also potentially perforated TM.  Will give antibiotics.  Follow-up with ENT.  Well-appearing.  Discharge home Final Clinical  Impression(s) / ED Diagnoses Final diagnoses:  Acute otitis externa of right ear, unspecified type    Rx / DC Orders ED Discharge Orders          Ordered    amoxicillin (AMOXIL) 500 MG capsule  3 times daily        07/16/21 1205             Benjiman Core, MD 07/16/21 1214

## 2021-07-16 NOTE — ED Triage Notes (Signed)
Pt c/o body aches, cough, right ear pain with bloody drainage, decreased appetite and sore throat since Friday.

## 2021-07-16 NOTE — Discharge Instructions (Signed)
It looks like your eardrum potentially could be perforated tube.  Take the antibiotics.  Follow-up with ear nose and throat

## 2021-07-31 ENCOUNTER — Encounter: Payer: Self-pay | Admitting: *Deleted

## 2021-08-03 ENCOUNTER — Ambulatory Visit: Payer: Medicaid Other | Admitting: Diagnostic Neuroimaging

## 2021-08-03 ENCOUNTER — Other Ambulatory Visit: Payer: Self-pay

## 2021-08-03 ENCOUNTER — Encounter: Payer: Self-pay | Admitting: Diagnostic Neuroimaging

## 2021-08-03 VITALS — BP 133/93 | HR 86 | Ht 62.0 in | Wt 219.0 lb

## 2021-08-03 DIAGNOSIS — G629 Polyneuropathy, unspecified: Secondary | ICD-10-CM | POA: Diagnosis not present

## 2021-08-03 NOTE — Progress Notes (Signed)
[facility]  PATIENT: Stacy [name] DOB: Feb 11, 1981  REFERRING CLINICIAN: Neale Burly, MD HISTORY FROM: patient  REASON FOR VISIT: new consult    HISTORICAL  CHIEF COMPLAINT:  Chief Complaint  Patient presents with   New Patient (Initial Visit)    NP/Paper Proficient/[facility]/Xaje Hasanaj MD [phone] C/o pain in both feet that radiates to legs right below knees, states it worse while standing for long periods of time, sitting near meatal and walking with out shoes  Reports pain today in feet, pain scale 8    HISTORY OF PRESENT ILLNESS:   40 year old female here for evaluation of neuropathy.  History of gastric bypass surgery in 2014 and prior alcohol abuse.  April 2020 patient had onset of cramps, numbness and pain in her feet.  In May 2020 she was diagnosed with pulmonary embolism and treated with anticoagulation.  Patient also had B12 deficiency and alcohol abuse.  She was counseled and treated for these.  Patient has reduced alcohol use around 2021, and now only has alcohol 2 times per month.  She is on general multivitamin.  Continues to have pain and cramps in her feet up to her calves.   REVIEW OF SYSTEMS: Full 14 system review of systems performed and negative with exception of: as per HPI.  ALLERGIES: Allergies  Allergen Reactions   Lovenox  [Enoxaparin Sodium] Hives   Ibuprofen     Gastric bypass surgery and history of gi bleed.    HOME MEDICATIONS: Outpatient Medications Prior to Visit  Medication Sig Dispense Refill   gabapentin (NEURONTIN) 300 MG capsule Take 1 capsule (300 mg total) by mouth 3 (three) times daily. 90 capsule 0   acetaminophen (TYLENOL) 500 MG tablet Take 1,000 mg by mouth every 6 (six) hours as needed for mild pain or moderate pain.      amLODipine (NORVASC) 10 MG tablet Take 1 tablet (10 mg total) by mouth daily for 30 days. 30 tablet 0   amoxicillin (AMOXIL) 500 MG capsule Take 1  capsule (500 mg total) by mouth 3 (three) times daily. 21 capsule 0   lidocaine (XYLOCAINE) 2 % solution Use as directed 15 mLs in the mouth or throat every 6 (six) hours as needed for mouth pain. 15 mL 0   metoprolol tartrate (LOPRESSOR) 25 MG tablet Take 0.5 tablets (12.5 mg total) by mouth 2 (two) times daily for 30 days. 30 tablet 0   Multiple Vitamin (MULTIVITAMIN WITH MINERALS) TABS tablet Take 1 tablet by mouth daily.     ondansetron (ZOFRAN ODT) 4 MG disintegrating tablet Take 1 tablet (4 mg total) by mouth every 8 (eight) hours as needed for nausea or vomiting. 20 tablet 0   warfarin (COUMADIN) 1 MG tablet Take 1 tablet (1 mg total) by mouth daily. 30 tablet 2   No facility-administered medications prior to visit.    PAST MEDICAL HISTORY: Past Medical History:  Diagnosis Date   Neuropathy 01/09/2019   Pulmonary embolism ([place])     PAST SURGICAL HISTORY: Past Surgical History:  Procedure Laterality Date   GASTRIC BYPASS     TONSILLECTOMY      FAMILY HISTORY: Family History  Problem Relation Age of Onset   Chronic infections Mother    Diabetes Mother    Healthy Father    High blood pressure Maternal Grandfather    Diabetes Maternal Grandfather    Diabetes Paternal Grandmother    Cancer Paternal Grandmother     SOCIAL HISTORY: Social History  Socioeconomic History   Marital status: Single    Spouse name: Not on file   Number of children: 3   Years of education: Not on file   Highest education level: Not on file  Occupational History   Not on file  Tobacco Use   Smoking status: Some Days    Types: Cigarettes   Smokeless tobacco: Never   Tobacco comments:    4 cigarettes daily when she does smoke  Vaping Use   Vaping Use: Never used  Substance and Sexual Activity   Alcohol use: Yes    Comment: occ   Drug use: No   Sexual activity: Yes    Birth control/protection: Implant  Other Topics Concern   Not on file  Social History Narrative   Not on file    Social Determinants of Health   Financial Resource Strain: Not on file  Food Insecurity: Not on file  Transportation Needs: Not on file  Physical Activity: Not on file  Stress: Not on file  Social Connections: Not on file  Intimate Partner Violence: Not on file     PHYSICAL EXAM  GENERAL EXAM/CONSTITUTIONAL: Vitals:  Vitals:   08/03/21 0942  BP: (!) 133/93  Pulse: 86  Weight: 219 lb (99.3 kg)  Height: 5' 2"  (1.575 m)   Body mass index is 40.06 kg/m. Wt Readings from Last 3 Encounters:  08/03/21 219 lb (99.3 kg)  07/16/21 198 lb (89.8 kg)  08/31/20 199 lb (90.3 kg)   Patient is in no distress; well developed, nourished and groomed; neck is supple  CARDIOVASCULAR: Examination of carotid arteries is normal; no carotid bruits Regular rate and rhythm, no murmurs Examination of peripheral vascular system by observation and palpation is normal  EYES: Ophthalmoscopic exam of optic discs and posterior segments is normal; no papilledema or hemorrhages No results found.  MUSCULOSKELETAL: Gait, strength, tone, movements noted in Neurologic exam below  NEUROLOGIC: MENTAL STATUS:  No flowsheet data found. awake, alert, oriented to person, place and time recent and remote memory intact normal attention and concentration language fluent, comprehension intact, naming intact fund of knowledge appropriate  CRANIAL NERVE:  2nd - no papilledema on fundoscopic exam 2nd, 3rd, 4th, 6th - pupils equal and reactive to light, visual fields full to confrontation, extraocular muscles intact, no nystagmus 5th - facial sensation symmetric 7th - facial strength symmetric 8th - hearing intact 9th - palate elevates symmetrically, uvula midline 11th - shoulder shrug symmetric 12th - tongue protrusion midline  MOTOR:  normal bulk and tone, full strength in the BUE, BLE  SENSORY:  normal and symmetric to light touch, pinprick, temperature, vibration; EXCEPT SLIGHTL DECR IN  FEET  COORDINATION:  finger-nose-finger, fine finger movements normal  REFLEXES:  deep tendon reflexes present and symmetric; BUE 2+; KNEES 1; ANKLES TRACE  GAIT/STATION:  narrow based gait     DIAGNOSTIC DATA (LABS, IMAGING, TESTING) - I reviewed patient records, labs, notes, testing and imaging myself where available.  Lab Results  Component Value Date   WBC 5.2 08/31/2020   HGB 14.1 08/31/2020   HCT 43.3 08/31/2020   MCV 98.4 08/31/2020   PLT 252 08/31/2020      Component Value Date/Time   NA 139 08/31/2020 1636   K 3.9 08/31/2020 1636   CL 104 08/31/2020 1636   CO2 21 (L) 08/31/2020 1636   GLUCOSE 71 08/31/2020 1636   BUN <5 (L) 08/31/2020 1636   CREATININE 0.66 08/31/2020 1636   CALCIUM 8.9 08/31/2020 1636  PROT 7.5 08/31/2020 1636   ALBUMIN 3.7 08/31/2020 1636   AST 43 (H) 08/31/2020 1636   ALT 54 (H) 08/31/2020 1636   ALKPHOS 89 08/31/2020 1636   BILITOT 1.1 08/31/2020 1636   GFRNONAA >60 08/31/2020 1636   GFRAA >60 06/03/2019 0423   Lab Results  Component Value Date   CHOL 129 01/11/2019   HDL 56 01/11/2019   LDLCALC 63 01/11/2019   TRIG 48 01/11/2019   CHOLHDL 2.3 01/11/2019   Lab Results  Component Value Date   HGBA1C 4.5 (L) 01/13/2019   Lab Results  Component Value Date   VITAMINB12 240 06/03/2019   Lab Results  Component Value Date   TSH 3.740 01/06/2019     08/31/20 CT head  - Normal head CT.    ASSESSMENT AND PLAN  40 y.o. year old female here with:   Dx:  1. Neuropathy     PLAN:  Neuropathy pain in feet / calves (since April 2020 --> ddx: vitamin deficiency vs h/o gastric bypass 2014 vs h/o alcohol abuse) - check labs - check EMG/NCS - continue gabapentin, cymbalta, topical creams  Orders Placed This Encounter  Procedures   CBC with diff   CMP   Vitamin B12   MMA   Homocysteine   A1c   TSH   SPEP with IFE   ANA w/Reflex   SSA, SSB   ESR   CRP   Vitamin B6   Vitamin B1   HIV   RPR   Hepatitis B  surface antibody, qualitative   Hepatitis B surface antigen   Hepatitis B core antibody, total   Hepatitis C antibody   ANCA Profile   Return for for NCV/EMG.    Penni Bombard, MD 54/62/7035, 0:09 PM Certified in Neurology, Neurophysiology and Neuroimaging  [facility] [address] [place], [place] [address] [phone]

## 2021-08-10 LAB — TSH: TSH: 2 u[IU]/mL (ref 0.450–4.500)

## 2021-08-10 LAB — CBC WITH DIFFERENTIAL/PLATELET
Basophils Absolute: 0 10*3/uL (ref 0.0–0.2)
Basos: 0 %
EOS (ABSOLUTE): 0.1 10*3/uL (ref 0.0–0.4)
Eos: 2 %
Hematocrit: 39.4 % (ref 34.0–46.6)
Hemoglobin: 13.6 g/dL (ref 11.1–15.9)
Immature Grans (Abs): 0 10*3/uL (ref 0.0–0.1)
Immature Granulocytes: 0 %
Lymphocytes Absolute: 1.6 10*3/uL (ref 0.7–3.1)
Lymphs: 27 %
MCH: 34 pg — ABNORMAL HIGH (ref 26.6–33.0)
MCHC: 34.5 g/dL (ref 31.5–35.7)
MCV: 99 fL — ABNORMAL HIGH (ref 79–97)
Monocytes Absolute: 0.4 10*3/uL (ref 0.1–0.9)
Monocytes: 7 %
Neutrophils Absolute: 3.7 10*3/uL (ref 1.4–7.0)
Neutrophils: 64 %
Platelets: 231 10*3/uL (ref 150–450)
RBC: 4 x10E6/uL (ref 3.77–5.28)
RDW: 14 % (ref 11.7–15.4)
WBC: 5.8 10*3/uL (ref 3.4–10.8)

## 2021-08-10 LAB — COMPREHENSIVE METABOLIC PANEL
ALT: 99 IU/L — ABNORMAL HIGH (ref 0–32)
AST: 156 IU/L — ABNORMAL HIGH (ref 0–40)
Albumin/Globulin Ratio: 1.4 (ref 1.2–2.2)
Albumin: 4.5 g/dL (ref 3.8–4.8)
Alkaline Phosphatase: 128 IU/L — ABNORMAL HIGH (ref 44–121)
BUN/Creatinine Ratio: 14 (ref 9–23)
BUN: 11 mg/dL (ref 6–24)
Bilirubin Total: 1.2 mg/dL (ref 0.0–1.2)
CO2: 23 mmol/L (ref 20–29)
Calcium: 9.7 mg/dL (ref 8.7–10.2)
Chloride: 98 mmol/L (ref 96–106)
Creatinine, Ser: 0.77 mg/dL (ref 0.57–1.00)
Globulin, Total: 3.2 g/dL (ref 1.5–4.5)
Glucose: 81 mg/dL (ref 70–99)
Potassium: 4.4 mmol/L (ref 3.5–5.2)
Sodium: 140 mmol/L (ref 134–144)
Total Protein: 7.7 g/dL (ref 6.0–8.5)
eGFR: 100 mL/min/{1.73_m2} (ref >59)

## 2021-08-10 LAB — HEPATITIS B SURFACE ANTIGEN: Hepatitis B Surface Ag: NEGATIVE

## 2021-08-10 LAB — METHYLMALONIC ACID, SERUM: Methylmalonic Acid: 184 nmol/L (ref 0–378)

## 2021-08-10 LAB — RPR: RPR Ser Ql: NONREACTIVE

## 2021-08-10 LAB — ANCA PROFILE
Anti-MPO Antibodies: <0.2 units (ref 0.0–0.9)
Anti-PR3 Antibodies: <0.2 units (ref 0.0–0.9)
Atypical pANCA: <1:20 {titer}
C-ANCA: <1:20 {titer}
P-ANCA: <1:20 {titer}

## 2021-08-10 LAB — MULTIPLE MYELOMA PANEL, SERUM
Albumin SerPl Elph-Mcnc: 3.8 g/dL (ref 2.9–4.4)
Albumin/Glob SerPl: 1 (ref 0.7–1.7)
Alpha 1: 0.3 g/dL (ref 0.0–0.4)
Alpha2 Glob SerPl Elph-Mcnc: 1 g/dL (ref 0.4–1.0)
B-Globulin SerPl Elph-Mcnc: 1 g/dL (ref 0.7–1.3)
Gamma Glob SerPl Elph-Mcnc: 1.6 g/dL (ref 0.4–1.8)
Globulin, Total: 3.9 g/dL (ref 2.2–3.9)
IgA/Immunoglobulin A, Serum: 288 mg/dL (ref 87–352)
IgG (Immunoglobin G), Serum: 1467 mg/dL (ref 586–1602)
IgM (Immunoglobulin M), Srm: 206 mg/dL (ref 26–217)

## 2021-08-10 LAB — VITAMIN B12: Vitamin B-12: 484 pg/mL (ref 232–1245)

## 2021-08-10 LAB — SEDIMENTATION RATE: Sed Rate: 63 mm/hr — ABNORMAL HIGH (ref 0–32)

## 2021-08-10 LAB — SJOGREN'S SYNDROME ANTIBODS(SSA + SSB)
ENA SSA (RO) Ab: <0.2 AI (ref 0.0–0.9)
ENA SSB (LA) Ab: <0.2 AI (ref 0.0–0.9)

## 2021-08-10 LAB — ANA W/REFLEX: ANA Titer 1: NEGATIVE

## 2021-08-10 LAB — VITAMIN B6: Vitamin B6: 28.1 ug/L (ref 3.4–65.2)

## 2021-08-10 LAB — VITAMIN B1: Thiamine: 125.1 nmol/L (ref 66.5–200.0)

## 2021-08-10 LAB — HEPATITIS B SURFACE ANTIBODY,QUALITATIVE: Hep B Surface Ab, Qual: NONREACTIVE

## 2021-08-10 LAB — HEMOGLOBIN A1C
Est. average glucose Bld gHb Est-mCnc: 91 mg/dL
Hgb A1c MFr Bld: 4.8 % (ref 4.8–5.6)

## 2021-08-10 LAB — C-REACTIVE PROTEIN: CRP: 2 mg/L (ref 0–10)

## 2021-08-10 LAB — HEPATITIS B CORE ANTIBODY, TOTAL: Hep B Core Total Ab: NEGATIVE

## 2021-08-10 LAB — HIV ANTIBODY (ROUTINE TESTING W REFLEX): HIV Screen 4th Generation wRfx: NONREACTIVE

## 2021-08-10 LAB — HEPATITIS C ANTIBODY: Hep C Virus Ab: 0.2 s/co ratio (ref 0.0–0.9)

## 2021-08-10 LAB — HOMOCYSTEINE: Homocysteine: 31.3 umol/L — ABNORMAL HIGH (ref 0.0–14.5)

## 2021-08-14 ENCOUNTER — Telehealth: Payer: Self-pay

## 2021-08-14 NOTE — Telephone Encounter (Signed)
Pt called back and we discussed lab results. Pt verbalized understanding and appreciation for the call.

## 2021-08-14 NOTE — Telephone Encounter (Signed)
I called pt and left vm asking for call back to discuss lab results.

## 2021-08-14 NOTE — Telephone Encounter (Signed)
-----   Message from Suanne Marker, MD sent at 08/14/2021  2:02 PM EST ----- Liver enzymes slightly abnormal. Caution with alcohol use. Needs PCP follow up.  Other labs ok. Please call patient. -VRP

## 2021-10-11 ENCOUNTER — Encounter: Payer: Medicaid Other | Admitting: Diagnostic Neuroimaging

## 2021-10-11 ENCOUNTER — Encounter: Payer: Self-pay | Admitting: Diagnostic Neuroimaging

## 2024-03-28 ENCOUNTER — Emergency Department (HOSPITAL_COMMUNITY)

## 2024-03-28 ENCOUNTER — Other Ambulatory Visit: Payer: Self-pay

## 2024-03-28 ENCOUNTER — Observation Stay (HOSPITAL_COMMUNITY)
Admission: EM | Admit: 2024-03-28 | Discharge: 2024-04-01 | Disposition: A | Discharge status: Home Health | Attending: Family Medicine | Admitting: Family Medicine

## 2024-03-28 ENCOUNTER — Encounter (HOSPITAL_COMMUNITY): Payer: Self-pay | Admitting: Emergency Medicine

## 2024-03-28 DIAGNOSIS — D529 Folate deficiency anemia, unspecified: Secondary | ICD-10-CM | POA: Diagnosis not present

## 2024-03-28 DIAGNOSIS — E538 Deficiency of other specified B group vitamins: Secondary | ICD-10-CM | POA: Diagnosis present

## 2024-03-28 DIAGNOSIS — K760 Fatty (change of) liver, not elsewhere classified: Secondary | ICD-10-CM | POA: Diagnosis present

## 2024-03-28 DIAGNOSIS — E66812 Obesity, class 2: Secondary | ICD-10-CM | POA: Diagnosis not present

## 2024-03-28 DIAGNOSIS — D539 Nutritional anemia, unspecified: Secondary | ICD-10-CM | POA: Diagnosis not present

## 2024-03-28 DIAGNOSIS — R269 Unspecified abnormalities of gait and mobility: Secondary | ICD-10-CM

## 2024-03-28 DIAGNOSIS — M5412 Radiculopathy, cervical region: Secondary | ICD-10-CM | POA: Insufficient documentation

## 2024-03-28 DIAGNOSIS — R7989 Other specified abnormal findings of blood chemistry: Secondary | ICD-10-CM | POA: Diagnosis not present

## 2024-03-28 DIAGNOSIS — F10931 Alcohol use, unspecified with withdrawal delirium: Secondary | ICD-10-CM | POA: Diagnosis present

## 2024-03-28 DIAGNOSIS — R531 Weakness: Secondary | ICD-10-CM | POA: Diagnosis present

## 2024-03-28 DIAGNOSIS — F1010 Alcohol abuse, uncomplicated: Secondary | ICD-10-CM | POA: Diagnosis present

## 2024-03-28 DIAGNOSIS — I639 Cerebral infarction, unspecified: Secondary | ICD-10-CM | POA: Diagnosis not present

## 2024-03-28 DIAGNOSIS — Z6835 Body mass index (BMI) 35.0-35.9, adult: Secondary | ICD-10-CM | POA: Diagnosis not present

## 2024-03-28 DIAGNOSIS — N3000 Acute cystitis without hematuria: Secondary | ICD-10-CM | POA: Insufficient documentation

## 2024-03-28 DIAGNOSIS — R202 Paresthesia of skin: Secondary | ICD-10-CM | POA: Insufficient documentation

## 2024-03-28 DIAGNOSIS — R270 Ataxia, unspecified: Principal | ICD-10-CM | POA: Insufficient documentation

## 2024-03-28 DIAGNOSIS — F17210 Nicotine dependence, cigarettes, uncomplicated: Secondary | ICD-10-CM | POA: Diagnosis not present

## 2024-03-28 DIAGNOSIS — F1090 Alcohol use, unspecified, uncomplicated: Secondary | ICD-10-CM | POA: Insufficient documentation

## 2024-03-28 DIAGNOSIS — E559 Vitamin D deficiency, unspecified: Secondary | ICD-10-CM | POA: Diagnosis not present

## 2024-03-28 DIAGNOSIS — R29818 Other symptoms and signs involving the nervous system: Principal | ICD-10-CM | POA: Insufficient documentation

## 2024-03-28 DIAGNOSIS — G9341 Metabolic encephalopathy: Secondary | ICD-10-CM

## 2024-03-28 LAB — URINALYSIS, ROUTINE W REFLEX MICROSCOPIC
Glucose, UA: NEGATIVE mg/dL
Hgb urine dipstick: NEGATIVE
Ketones, ur: 5 mg/dL — AB
Nitrite: POSITIVE — AB
Protein, ur: 100 mg/dL — AB
Specific Gravity, Urine: 1.021 (ref 1.005–1.030)
pH: 5 (ref 5.0–8.0)

## 2024-03-28 LAB — COMPREHENSIVE METABOLIC PANEL WITH GFR
ALT: 40 U/L (ref 0–44)
AST: 67 U/L — ABNORMAL HIGH (ref 15–41)
Albumin: 3.5 g/dL (ref 3.5–5.0)
Alkaline Phosphatase: 80 U/L (ref 38–126)
Anion gap: 14 (ref 5–15)
BUN: 7 mg/dL (ref 6–20)
CO2: 22 mmol/L (ref 22–32)
Calcium: 9 mg/dL (ref 8.9–10.3)
Chloride: 99 mmol/L (ref 98–111)
Creatinine, Ser: 0.54 mg/dL (ref 0.44–1.00)
GFR, Estimated: >60 mL/min (ref >60)
Glucose, Bld: 102 mg/dL — ABNORMAL HIGH (ref 70–99)
Potassium: 3.6 mmol/L (ref 3.5–5.1)
Sodium: 135 mmol/L (ref 135–145)
Total Bilirubin: 2.7 mg/dL — ABNORMAL HIGH (ref 0.0–1.2)
Total Protein: 7.1 g/dL (ref 6.5–8.1)

## 2024-03-28 LAB — HCG, SERUM, QUALITATIVE: Preg, Serum: NEGATIVE

## 2024-03-28 LAB — CBC
HCT: 31.8 % — ABNORMAL LOW (ref 36.0–46.0)
Hemoglobin: 10.8 g/dL — ABNORMAL LOW (ref 12.0–15.0)
MCH: 37.4 pg — ABNORMAL HIGH (ref 26.0–34.0)
MCHC: 34 g/dL (ref 30.0–36.0)
MCV: 110 fL — ABNORMAL HIGH (ref 80.0–100.0)
Platelets: 309 K/uL (ref 150–400)
RBC: 2.89 MIL/uL — ABNORMAL LOW (ref 3.87–5.11)
RDW: 16.4 % — ABNORMAL HIGH (ref 11.5–15.5)
WBC: 4.8 K/uL (ref 4.0–10.5)
nRBC: 0 % (ref 0.0–0.2)

## 2024-03-28 LAB — RAPID URINE DRUG SCREEN, HOSP PERFORMED
Amphetamines: NOT DETECTED
Barbiturates: NOT DETECTED
Benzodiazepines: NOT DETECTED
Cocaine: NOT DETECTED
Opiates: NOT DETECTED
Tetrahydrocannabinol: NOT DETECTED

## 2024-03-28 LAB — CBG MONITORING, ED: Glucose-Capillary: 112 mg/dL — ABNORMAL HIGH (ref 70–99)

## 2024-03-28 LAB — T4, FREE: Free T4: 0.88 ng/dL (ref 0.61–1.12)

## 2024-03-28 LAB — ETHANOL: Alcohol, Ethyl (B): <15 mg/dL (ref <15)

## 2024-03-28 LAB — MAGNESIUM: Magnesium: 1.5 mg/dL — ABNORMAL LOW (ref 1.7–2.4)

## 2024-03-28 LAB — VITAMIN B12: Vitamin B-12: 312 pg/mL (ref 180–914)

## 2024-03-28 LAB — TSH: TSH: 3.462 u[IU]/mL (ref 0.350–4.500)

## 2024-03-28 MED ORDER — IOHEXOL 350 MG/ML SOLN
75.0000 mL | Freq: Once | INTRAVENOUS | Status: AC | PRN
  Administered 2024-03-28: 75 mL via INTRAVENOUS

## 2024-03-28 MED ORDER — GABAPENTIN 300 MG PO CAPS
300.0000 mg | ORAL_CAPSULE | Freq: Three times a day (TID) | ORAL | Status: DC
  Administered 2024-03-28 – 2024-04-01 (×21): 300 mg via ORAL
  Filled 2024-03-28 (×12): qty 1

## 2024-03-28 MED ORDER — ACETAMINOPHEN 325 MG PO TABS
650.0000 mg | ORAL_TABLET | Freq: Four times a day (QID) | ORAL | Status: DC | PRN
  Administered 2024-03-29 (×4): 650 mg via ORAL
  Filled 2024-03-28 (×2): qty 2

## 2024-03-28 MED ORDER — ACETAMINOPHEN 650 MG RE SUPP: 650.0000 mg | Freq: Four times a day (QID) | RECTAL | Status: DC | PRN

## 2024-03-28 MED ORDER — LORAZEPAM 0.5 MG PO TABS
0.5000 mg | ORAL_TABLET | Freq: Once | ORAL | Status: AC
  Administered 2024-03-28: 0.5 mg via ORAL
  Filled 2024-03-28: qty 1

## 2024-03-28 MED ORDER — FE FUM-VIT C-VIT B12-FA 460-60-0.01-1 MG PO CAPS
1.0000 | ORAL_CAPSULE | Freq: Every day | ORAL | Status: DC
  Administered 2024-03-30 – 2024-04-01 (×5): 1 via ORAL
  Filled 2024-03-28 (×6): qty 1

## 2024-03-28 MED ORDER — ONDANSETRON HCL 4 MG/2ML IJ SOLN: 4.0000 mg | Freq: Four times a day (QID) | INTRAMUSCULAR | Status: DC | PRN

## 2024-03-28 MED ORDER — MAGNESIUM SULFATE 2 GM/50ML IV SOLN
2.0000 g | Freq: Once | INTRAVENOUS | Status: AC
  Administered 2024-03-28: 2 g via INTRAVENOUS
  Filled 2024-03-28: qty 50

## 2024-03-28 MED ORDER — MAGNESIUM SULFATE IN D5W 1-5 GM/100ML-% IV SOLN
1.0000 g | Freq: Once | INTRAVENOUS | Status: AC
  Administered 2024-03-28: 1 g via INTRAVENOUS
  Filled 2024-03-28: qty 100

## 2024-03-28 MED ORDER — ONDANSETRON HCL 4 MG PO TABS: 4.0000 mg | ORAL_TABLET | Freq: Four times a day (QID) | ORAL | Status: DC | PRN

## 2024-03-28 MED ORDER — HEPARIN SODIUM (PORCINE) 5000 UNIT/ML IJ SOLN
5000.0000 [IU] | Freq: Three times a day (TID) | INTRAMUSCULAR | Status: DC
  Administered 2024-03-28 – 2024-04-01 (×21): 5000 [IU] via SUBCUTANEOUS
  Filled 2024-03-28 (×11): qty 1

## 2024-03-28 MED ORDER — CEPHALEXIN 500 MG PO CAPS
500.0000 mg | ORAL_CAPSULE | Freq: Once | ORAL | Status: AC
  Administered 2024-03-28: 500 mg via ORAL
  Filled 2024-03-28: qty 1

## 2024-03-28 MED ORDER — OXYCODONE HCL 5 MG PO TABS
5.0000 mg | ORAL_TABLET | ORAL | Status: DC | PRN
  Administered 2024-03-29 – 2024-04-01 (×8): 5 mg via ORAL
  Filled 2024-03-28 (×5): qty 1

## 2024-03-28 NOTE — ED Triage Notes (Signed)
 Pt via POV c/o numbness and tingling since Wednesday (5 days), progressively worsening, with weakness traveling down her body to both feet. Generalized body aches. Pt also states she has noticed difficulty controlling her saliva; she is holding a washcloth against her mouth while talking due to drool. Denies slurred speech, no facial droop, no visual changes. Pt reports gait instability.

## 2024-03-28 NOTE — H&P (Signed)
 History and Physical    Stacy [name] FMW:[id number] DOB: January 24, 1981 DOA: 03/28/2024  PCP: [facility]   Chief Complaint: Sensory changes and weakness.  HPI: Stacy [name] is a 43 y.o. female with medical history significant of gastric bypass who presented with discomfort in all extremities as well as paresthesias and sensory changes.  Patient had a fall and gait changes.  She presented to the ER where she was found to be afebrile hemodynamically stable.  She endorses drinking 10-15 drinks per week.  Labs were obtained and was indication which showed urinalysis concerning for infection, AST 67, ALT 40, hemoglobin 10.8, MCV 110, UDS negative, TSH 3.4, B12 312, free T4 0.88, magnesium  1.5.    Review of Systems: Review of Systems  Constitutional:  Negative for chills and fever.  HENT: Negative.    Respiratory: Negative.    Cardiovascular: Negative.   Gastrointestinal: Negative.   Genitourinary: Negative.   Musculoskeletal:  Positive for falls.  Skin: Negative.   Neurological:  Positive for dizziness, tingling and sensory change.  Endo/Heme/Allergies: Negative.   Psychiatric/Behavioral: Negative.       As per HPI otherwise 10 point review of systems negative.   Allergies  Allergen Reactions   Ibuprofen  Other (See Comments)    Gastric bypass surgery and history of GI bleed.   Lovenox   [Enoxaparin  Sodium] Hives    Past Medical History:  Diagnosis Date   Neuropathy 01/09/2019   Pulmonary embolism [facility])     Past Surgical History:  Procedure Laterality Date   GASTRIC BYPASS     TONSILLECTOMY       reports that she has been smoking cigarettes. She has never used smokeless tobacco. She reports current alcohol use. She reports that she does not use drugs.  Family History  Problem Relation Age of Onset   Chronic infections Mother    Diabetes Mother    Healthy Father    High blood pressure Maternal Grandfather    Diabetes Maternal Grandfather     Diabetes Paternal Grandmother    Cancer Paternal Grandmother     Prior to Admission medications   Medication Sig Start Date End Date Taking? Authorizing Provider  acetaminophen  (TYLENOL ) 500 MG tablet Take 1,000 mg by mouth every 6 (six) hours as needed for mild pain (pain score 1-3).   Yes [provider]  gabapentin  (NEURONTIN ) 300 MG capsule Take 1 capsule (300 mg total) by mouth 3 (three) times daily. 09/15/19 03/28/25 Yes Walisiewicz, Kaitlyn E, PA-C  ibuprofen  (ADVIL ) 200 MG tablet Take 400 mg by mouth every 6 (six) hours as needed for mild pain (pain score 1-3).   Yes [provider]    Physical Exam: Vitals:   03/28/24 1738 03/28/24 1815 03/28/24 1830 03/28/24 2035  BP:   (!) 137/94 (!) 124/95  Pulse:   98 100  Resp:  15  20  Temp: 98.1 F (36.7 C)   98.3 F (36.8 C)  TempSrc: Oral   Oral  SpO2:   100% 100%  Weight:    87.9 kg  Height:    5' 2 (1.575 m)   Physical Exam Constitutional:      Appearance: She is normal weight.  HENT:     Head: Normocephalic.     Nose: Nose normal.     Mouth/Throat:     Mouth: Mucous membranes are moist.     Pharynx: Oropharynx is clear.  Eyes:     Conjunctiva/sclera: Conjunctivae normal.     Pupils: Pupils are equal, round,  and reactive to light.  Cardiovascular:     Rate and Rhythm: Normal rate and regular rhythm.     Pulses: Normal pulses.  Pulmonary:     Effort: Pulmonary effort is normal.  Abdominal:     General: Abdomen is flat.  Musculoskeletal:        General: Normal range of motion.     Cervical back: Normal range of motion.  Skin:    General: Skin is warm.     Capillary Refill: Capillary refill takes less than 2 seconds.  Neurological:     Mental Status: She is alert.     Cranial Nerves: No cranial nerve deficit.     Sensory: Sensory deficit present.     Motor: No weakness.     Gait: Gait abnormal.  Psychiatric:        Mood and Affect: Mood normal.        Labs on Admission: I have personally  reviewed the patients's labs and imaging studies.  Assessment/Plan Principal Problem:   CVA (cerebrovascular accident) [facility])   # Gait changes and sensory deficits #Hx of gastric bypass 2014 - Teleneurology consulted in ER with concern for CVA - No focal deficits on my examination - Mostly sensory and paresthesias -MR, CT unrevealing Plan: Due to history of gastric bypass will order vitamin deficiencies including thiamine , folate, vitamin AED and K Replete magnesium  Replete vitamin B12 Appreciate neuro recs in morning.   #Hypomag- repelte #Chronic anemia- trend hgb    Admission status: Inpatient Telemetry  Certification: The appropriate patient status for this patient is INPATIENT. Inpatient status is judged to be reasonable and necessary in order to provide the required intensity of service to ensure the patient's safety. The patient's presenting symptoms, physical exam findings, and initial radiographic and laboratory data in the context of their chronic comorbidities is felt to place them at high risk for further clinical deterioration. Furthermore, it is not anticipated that the patient will be medically stable for discharge from the hospital within 2 midnights of admission.   * I certify that at the point of admission it is my clinical judgment that the patient will require inpatient hospital care spanning beyond 2 midnights from the point of admission due to high intensity of service, high risk for further deterioration and high frequency of surveillance required.DEWAINE Lamar Dess MD [facility] If 7PM-7AM, please contact night-coverage [email or web address]  03/28/2024, 9:38 PM   \]

## 2024-03-28 NOTE — Plan of Care (Signed)
  Problem: Education: Goal: Knowledge of General Education information will improve Description: Including pain rating scale, medication(s)/side effects and non-pharmacologic comfort measures Outcome: Progressing   Problem: Health Behavior/Discharge Planning: Goal: Ability to manage health-related needs will improve Outcome: Progressing   Problem: Clinical Measurements: Goal: Ability to maintain clinical measurements within normal limits will improve Outcome: Progressing   Problem: Activity: Goal: Risk for activity intolerance will decrease Outcome: Progressing   Problem: Nutrition: Goal: Adequate nutrition will be maintained Outcome: Progressing   Problem: Coping: Goal: Level of anxiety will decrease Outcome: Progressing   Problem: Elimination: Goal: Will not experience complications related to bowel motility Outcome: Progressing Goal: Will not experience complications related to urinary retention Outcome: Progressing   Problem: Pain Managment: Goal: General experience of comfort will improve and/or be controlled Outcome: Progressing

## 2024-03-28 NOTE — ED Notes (Addendum)
 Ambulation trial attempted at this time and was unsuccessful. Pt went to take a first step and was very unsteady and was grabbing onto the counter and bed for support. Pt was assisted back into the bed. Provider aware.

## 2024-03-28 NOTE — ED Provider Notes (Signed)
 [place] EMERGENCY DEPARTMENT AT [facility] Provider Note   CSN: [id number] Arrival date & time: 03/28/24  1223     Patient presents with: Numbness   Stacy [name] is a 43 y.o. female who presents to the ED today with a 5-day history of what she describes progressively increasing discomfort in all extremities as well as increasing paresthesia.  She further endorses dizziness while standing with a fall happening approximately 3 days ago.  She denies being vertiginous, stating that there is more of a swaying sensation that is intensifies with ambulation.  Further she has had increased drooling over the last several days and her father states that she seems to be talking more with clenched teeth, and sounds more slurred in her speech.  The only history found on chart review shows that in 2022 she was treated by neurology for peripheral neuropathy and placed on gabapentin  for the same.  She also seems to have had a history of gastric bypass surgery in 2014 and also had previous alcohol abuse.  She does endorse drinking 3-4 times weekly with 2 to 3 glasses of wine per session.  She occasionally smokes tobacco, denies any controlled substance use.  She further endorses left lower extremity weakness.     HPI     Prior to Admission medications   Medication Sig Start Date End Date Taking? Authorizing Provider  acetaminophen  (TYLENOL ) 500 MG tablet Take 1,000 mg by mouth every 6 (six) hours as needed for mild pain (pain score 1-3).   Yes [provider]  gabapentin  (NEURONTIN ) 300 MG capsule Take 1 capsule (300 mg total) by mouth 3 (three) times daily. 09/15/19 03/28/25 Yes Walisiewicz, Kaitlyn E, PA-C  ibuprofen  (ADVIL ) 200 MG tablet Take 400 mg by mouth every 6 (six) hours as needed for mild pain (pain score 1-3).   Yes [provider]    Allergies: Ibuprofen  and Lovenox   [enoxaparin  sodium]    Review of Systems  HENT:  Positive for drooling.   Neurological:   Positive for dizziness, speech difficulty, weakness and numbness.  All other systems reviewed and are negative.   Updated Vital Signs BP (!) 131/99   Pulse 88   Temp 98.1 F (36.7 C) (Oral)   Resp 12   Ht 5' 2 (1.575 m)   Wt 91.6 kg   SpO2 98%   BMI 36.95 kg/m   Physical Exam Vitals and nursing note reviewed.  Constitutional:      General: She is not in acute distress.    Appearance: Normal appearance.  HENT:     Head: Normocephalic and atraumatic.     Mouth/Throat:     Mouth: Mucous membranes are moist.     Pharynx: Oropharynx is clear.  Eyes:     General: Lids are normal. Vision grossly intact. Gaze aligned appropriately. No visual field deficit or scleral icterus.    Extraocular Movements: Extraocular movements intact.     Right eye: No nystagmus.     Left eye: No nystagmus.     Conjunctiva/sclera: Conjunctivae normal.     Pupils: Pupils are equal, round, and reactive to light.  Cardiovascular:     Rate and Rhythm: Normal rate and regular rhythm.     Pulses: Normal pulses.     Heart sounds: Normal heart sounds. No murmur heard.    No friction rub. No gallop.  Pulmonary:     Effort: Pulmonary effort is normal.     Breath sounds: Normal breath sounds.  Abdominal:  General: Abdomen is flat. Bowel sounds are normal.     Palpations: Abdomen is soft.  Musculoskeletal:        General: Normal range of motion.     Cervical back: Normal range of motion and neck supple.     Right lower leg: No edema.     Left lower leg: No edema.  Skin:    General: Skin is warm and dry.     Capillary Refill: Capillary refill takes less than 2 seconds.  Neurological:     General: No focal deficit present.     Mental Status: She is alert and oriented to person, place, and time. Mental status is at baseline.     GCS: GCS eye subscore is 4. GCS verbal subscore is 5. GCS motor subscore is 6.     Cranial Nerves: Dysarthria present. No cranial nerve deficit or facial asymmetry.      Sensory: No sensory deficit.     Motor: No pronator drift.     Coordination: Heel to Shin Test abnormal. Finger-Nose-Finger Test normal.     Comments: Mild dysmetria appreciated with a abnormal heel-to-shin on the left.  However there is no focal motor strength deficit appreciated in all 4 extremities.  Mild difficulty with speaking noted and patient has her face covered with a rag due to what she describes as excessive drooling.  She does not have any sensory deficits appreciated to the head or face however she states she has persistent tingling in her entire face.  Psychiatric:        Attention and Perception: Attention and perception normal.        Mood and Affect: Mood and affect normal.        Behavior: Behavior normal. Behavior is cooperative.        Thought Content: Thought content normal.        Cognition and Memory: Cognition and memory normal.        Judgment: Judgment normal.     (all labs ordered are listed, but only abnormal results are displayed) Labs Reviewed  COMPREHENSIVE METABOLIC PANEL WITH GFR - Abnormal; Notable for the following components:      Result Value   Glucose, Bld 102 (*)    AST 67 (*)    Total Bilirubin 2.7 (*)    All other components within normal limits  CBC - Abnormal; Notable for the following components:   RBC 2.89 (*)    Hemoglobin 10.8 (*)    HCT 31.8 (*)    MCV 110.0 (*)    MCH 37.4 (*)    RDW 16.4 (*)    All other components within normal limits  URINALYSIS, ROUTINE W REFLEX MICROSCOPIC - Abnormal; Notable for the following components:   Color, Urine AMBER (*)    APPearance HAZY (*)    Bilirubin Urine SMALL (*)    Ketones, ur 5 (*)    Protein, ur 100 (*)    Nitrite POSITIVE (*)    Leukocytes,Ua MODERATE (*)    Bacteria, UA FEW (*)    All other components within normal limits  MAGNESIUM  - Abnormal; Notable for the following components:   Magnesium  1.5 (*)    All other components within normal limits  CBG MONITORING, ED - Abnormal;  Notable for the following components:   Glucose-Capillary 112 (*)    All other components within normal limits  ETHANOL  RAPID URINE DRUG SCREEN, HOSP PERFORMED  TSH  VITAMIN B12  HCG, SERUM, QUALITATIVE  T4, FREE  EKG: EKG Interpretation Date/Time:  Sunday March 28 2024 12:47:33 EDT Ventricular Rate:  97 PR Interval:  157 QRS Duration:  95 QT Interval:  399 QTC Calculation: 507 R Axis:   33  Text Interpretation: Sinus rhythm Probable left atrial enlargement Low voltage, precordial leads Probable anteroseptal infarct, old rate slower than prior 10/20 Confirmed by Towana Sharper [phone]) on 03/28/2024 3:26:16 PM  Radiology: CT Angio Head Neck W WO CM Result Date: 03/28/2024 EXAM: CTA Head and Neck with Intravenous Contrast. CT Head without Contrast. CLINICAL HISTORY: Neuro deficit, acute, stroke suspected; c/o numbness and tingling since Wednesday (5 days), progressively worsening, with weakness traveling down her body to both feet. Generalized body aches. Pt also states she has noticed difficulty controlling her saliva; she is holding a washcloth against her mouth while talking due to drool. Denies slurred speech, no facial droop, no visual changes. Pt reports gait instability. TECHNIQUE: Axial CTA images of the head and neck performed with intravenous contrast. MIP reconstructed images were created and reviewed. Axial computed tomography images of the head/brain performed without intravenous contrast. Note: Per PQRS, the description of internal carotid artery percent stenosis, including 0 percent or normal exam, is based on [place] Symptomatic Carotid Endarterectomy Trial (NASCET) criteria. Dose reduction technique was used including one or more of the following: automated exposure control, adjustment of mA and kV according to patient size, and/or iterative reconstruction. CONTRAST: Without and with; 75mL (iohexol  (OMNIPAQUE ) 350 MG/ML injection 75 mL IOHEXOL  350 MG/ML SOLN)  COMPARISON: Same day CT head. FINDINGS: CT HEAD: BRAIN: No acute intraparenchymal hemorrhage. No mass lesion. No CT evidence for acute territorial infarct. No midline shift or extra-axial collection. VENTRICLES: No hydrocephalus. ORBITS: The orbits are unremarkable. SINUSES AND MASTOIDS: Clear. CTA NECK: COMMON CAROTID ARTERIES: No significant stenosis. No dissection or occlusion. INTERNAL CAROTID ARTERIES: No stenosis by NASCET criteria. No dissection or occlusion. VERTEBRAL ARTERIES: No significant stenosis. No dissection or occlusion. CTA HEAD: ANTERIOR CEREBRAL ARTERIES: No significant stenosis. No occlusion. No aneurysm. MIDDLE CEREBRAL ARTERIES: No significant stenosis. No occlusion. No aneurysm. POSTERIOR CEREBRAL ARTERIES: No significant stenosis. No occlusion. No aneurysm. BASILAR ARTERY: No significant stenosis. No occlusion. No aneurysm. SOFT TISSUES: No acute finding. No masses or lymphadenopathy. BONES: No acute osseous abnormality. Degenerative endplate osteophytes at C5-6. Edentulous maxilla and mandible. IMPRESSION: 1. No large vessel occlusion, high-grade stenosis, aneurysmal dilatation, or dissection involving the arteries of the head and neck. Electronically signed by: Donnice Mania MD 03/28/2024 07:03 PM EDT RP Workstation: [id number]   CT Head Wo Contrast Result Date: 03/28/2024 CLINICAL DATA:  Neuro deficit, acute, stroke suspected Numbness and tingling for 5 days. EXAM: CT HEAD WITHOUT CONTRAST TECHNIQUE: Contiguous axial images were obtained from the base of the skull through the vertex without intravenous contrast. RADIATION DOSE REDUCTION: This exam was performed according to the departmental dose-optimization program which includes automated exposure control, adjustment of the mA and/or kV according to patient size and/or use of iterative reconstruction technique. COMPARISON:  Head CT 08/31/2020 FINDINGS: Brain: No intracranial hemorrhage, mass effect, or midline shift. No hydrocephalus.  The basilar cisterns are patent. No evidence of territorial infarct or acute ischemia. No extra-axial or intracranial fluid collection. Vascular: No hyperdense vessel or unexpected calcification. Skull: No fracture or focal lesion. Sinuses/Orbits: Minimal opacification of lower right mastoid air cells. The paranasal sinuses are clear. Other: None. IMPRESSION: No acute intracranial abnormality. Electronically Signed   By: Andrea Gasman M.D.   On: 03/28/2024 14:34   Results of MRI pending  Procedures   Medications Ordered in the ED  cephALEXin  (KEFLEX ) capsule 500 mg (500 mg Oral Given 03/28/24 1549)  magnesium  sulfate IVPB 1 g 100 mL (0 g Intravenous Stopped 03/28/24 1655)  iohexol  (OMNIPAQUE ) 350 MG/ML injection 75 mL (75 mLs Intravenous Contrast Given 03/28/24 1746)  LORazepam  (ATIVAN ) tablet 0.5 mg (0.5 mg Oral Given 03/28/24 1840)    Clinical Course as of 03/28/24 1937  Sun Mar 28, 2024  1518 Urinalysis, Routine w reflex microscopic -Urine, Clean Catch(!) Noted findings consistent with acute cystitis.  Initiated antibiotic therapy. [JG]  1825 Discussed case with Dr. Lindzen with neurology, did suggest MRI of the brain and likely admission for possible stroke. [JG]    Clinical Course User Index [JG] Myriam Dorn BROCKS, PA                                 Medical Decision Making Amount and/or Complexity of Data Reviewed Labs: ordered. Decision-making details documented in ED Course. Radiology: ordered.  Risk Prescription drug management. Decision regarding hospitalization.   Medical Decision Making:   Stacy [name] is a 43 y.o. female who presented to the ED today with numbness and tingling as well as dizziness detailed above.    Additional history discussed with patient's family/caregivers.  External chart has been reviewed including previous labs and outpatient clinic records. Patient placed on continuous vitals and telemetry monitoring while in ED which was reviewed  periodically.  Complete initial physical exam performed, notably the patient  was alert and oriented in no apparent distress.  Physical exam findings as noted with mild dysmetria appreciated with an abnormal heel-to-shin with the left leg.  There are no motor deficits that are appreciated though..    Reviewed and confirmed nursing documentation for past medical history, family history, social history.    Initial Assessment:   With the patient's presentation of numbness and tingling with dizziness, differential diagnosis includes subacute neurovascular insult, electrolyte derangement, hepatic encephalopathy, other encephalopathic state, hypothyroidism, medication induced neuropathy.  Initial Plan:  Obtain CT of the head to assess for acute neurovascular insult, consider CT angiogram Screening labs including CBC and Metabolic panel to evaluate for infectious or metabolic etiology of disease.  Urinalysis with reflex culture ordered to evaluate for UTI or relevant urologic/nephrologic pathology.  At that hCG for safety assessment for imaging. EKG to evaluate for cardiac pathology Objective evaluation as below reviewed   Initial Study Results:   Laboratory  All laboratory results reviewed without evidence of clinically relevant pathology.   Exceptions include: AST of 67 and bilirubin of 2.7.  Hemoglobin is 10.8.  Magnesium  1.5.  UA shows hazy appearing urine with moderate leukocyte esterase, nitrite positive, proteinuria, and bacteria.  EKG EKG was reviewed independently. Rate, rhythm, axis, intervals all examined and without medically relevant abnormality. ST segments without concerns for elevations.    Radiology:  All images reviewed independently. Agree with radiology report at this time.   CT Angio Head Neck W WO CM Result Date: 03/28/2024 EXAM: CTA Head and Neck with Intravenous Contrast. CT Head without Contrast. CLINICAL HISTORY: Neuro deficit, acute, stroke suspected; c/o numbness and  tingling since Wednesday (5 days), progressively worsening, with weakness traveling down her body to both feet. Generalized body aches. Pt also states she has noticed difficulty controlling her saliva; she is holding a washcloth against her mouth while talking due to drool. Denies slurred speech, no facial droop, no visual changes. Pt reports  gait instability. TECHNIQUE: Axial CTA images of the head and neck performed with intravenous contrast. MIP reconstructed images were created and reviewed. Axial computed tomography images of the head/brain performed without intravenous contrast. Note: Per PQRS, the description of internal carotid artery percent stenosis, including 0 percent or normal exam, is based on [place] Symptomatic Carotid Endarterectomy Trial (NASCET) criteria. Dose reduction technique was used including one or more of the following: automated exposure control, adjustment of mA and kV according to patient size, and/or iterative reconstruction. CONTRAST: Without and with; 75mL (iohexol  (OMNIPAQUE ) 350 MG/ML injection 75 mL IOHEXOL  350 MG/ML SOLN) COMPARISON: Same day CT head. FINDINGS: CT HEAD: BRAIN: No acute intraparenchymal hemorrhage. No mass lesion. No CT evidence for acute territorial infarct. No midline shift or extra-axial collection. VENTRICLES: No hydrocephalus. ORBITS: The orbits are unremarkable. SINUSES AND MASTOIDS: Clear. CTA NECK: COMMON CAROTID ARTERIES: No significant stenosis. No dissection or occlusion. INTERNAL CAROTID ARTERIES: No stenosis by NASCET criteria. No dissection or occlusion. VERTEBRAL ARTERIES: No significant stenosis. No dissection or occlusion. CTA HEAD: ANTERIOR CEREBRAL ARTERIES: No significant stenosis. No occlusion. No aneurysm. MIDDLE CEREBRAL ARTERIES: No significant stenosis. No occlusion. No aneurysm. POSTERIOR CEREBRAL ARTERIES: No significant stenosis. No occlusion. No aneurysm. BASILAR ARTERY: No significant stenosis. No occlusion. No aneurysm. SOFT  TISSUES: No acute finding. No masses or lymphadenopathy. BONES: No acute osseous abnormality. Degenerative endplate osteophytes at C5-6. Edentulous maxilla and mandible. IMPRESSION: 1. No large vessel occlusion, high-grade stenosis, aneurysmal dilatation, or dissection involving the arteries of the head and neck. Electronically signed by: Donnice Mania MD 03/28/2024 07:03 PM EDT RP Workstation: [id number]   CT Head Wo Contrast Result Date: 03/28/2024 CLINICAL DATA:  Neuro deficit, acute, stroke suspected Numbness and tingling for 5 days. EXAM: CT HEAD WITHOUT CONTRAST TECHNIQUE: Contiguous axial images were obtained from the base of the skull through the vertex without intravenous contrast. RADIATION DOSE REDUCTION: This exam was performed according to the departmental dose-optimization program which includes automated exposure control, adjustment of the mA and/or kV according to patient size and/or use of iterative reconstruction technique. COMPARISON:  Head CT 08/31/2020 FINDINGS: Brain: No intracranial hemorrhage, mass effect, or midline shift. No hydrocephalus. The basilar cisterns are patent. No evidence of territorial infarct or acute ischemia. No extra-axial or intracranial fluid collection. Vascular: No hyperdense vessel or unexpected calcification. Skull: No fracture or focal lesion. Sinuses/Orbits: Minimal opacification of lower right mastoid air cells. The paranasal sinuses are clear. Other: None. IMPRESSION: No acute intracranial abnormality. Electronically Signed   By: Andrea Gasman M.D.   On: 03/28/2024 14:34    Reassessment and Plan:   After extensive workup on this patient she did demonstrate a mild hypomagnesia at 1.5 and was given IV magnesium  for the same with no change in her paresthesias.  Labs also did show anemia with macrocytosis however B12 is normal at this time.  Dilation of thyroid  function did not show an elevated TSH at this time and pregnancy exam is negative.  UA did show  extensive UTI and she was initiated on Keflex  for the same.  Attempted to ambulate this patient and she is unable to ambulate without stumbling, demonstrating continued ataxia which was also evaluated on the exam with dysmetria to the left leg.  CT imaging did not demonstrate any acute neurovascular infarcts however MRI imaging is pending of this patient.  She also has an elevated AST of 67 with a bilirubin of 2.7; history of chronic alcohol abuse.  Discussed this case with neurology who  recommended MRI, admission for continued monitoring.  Discussed this case with Dr. Lindzen with neurology.  Further, discussed this case with Dr. Lavell with the hospitalist team who accepts this patient for admission.  At this patient would continue to need oral antibiotics for management of UTI, and per neurology consult will be followed with teleneurology tomorrow.       Final diagnoses:  Ataxia  Paresthesia    ED Discharge Orders     None          Myriam Dorn BROCKS, [place] 03/28/24 1941    Suzette Pac, MD 03/29/24 1640

## 2024-03-29 ENCOUNTER — Inpatient Hospital Stay (HOSPITAL_COMMUNITY)

## 2024-03-29 DIAGNOSIS — R531 Weakness: Secondary | ICD-10-CM

## 2024-03-29 DIAGNOSIS — F1010 Alcohol abuse, uncomplicated: Secondary | ICD-10-CM

## 2024-03-29 DIAGNOSIS — R269 Unspecified abnormalities of gait and mobility: Secondary | ICD-10-CM

## 2024-03-29 DIAGNOSIS — E538 Deficiency of other specified B group vitamins: Secondary | ICD-10-CM | POA: Diagnosis present

## 2024-03-29 LAB — URINALYSIS, W/ REFLEX TO CULTURE (INFECTION SUSPECTED)
Bacteria, UA: NONE SEEN
Bilirubin Urine: NEGATIVE
Glucose, UA: NEGATIVE mg/dL
Ketones, ur: NEGATIVE mg/dL
Nitrite: NEGATIVE
Protein, ur: NEGATIVE mg/dL
Specific Gravity, Urine: 1.012 (ref 1.005–1.030)
pH: 5 (ref 5.0–8.0)

## 2024-03-29 LAB — FERRITIN: Ferritin: 590 ng/mL — ABNORMAL HIGH (ref 11–307)

## 2024-03-29 LAB — IRON AND TIBC
Iron: 51 ug/dL (ref 28–170)
Saturation Ratios: 24 % (ref 10.4–31.8)
TIBC: 209 ug/dL — ABNORMAL LOW (ref 250–450)
UIBC: 158 ug/dL

## 2024-03-29 LAB — RETICULOCYTES
Immature Retic Fract: 15.1 % (ref 2.3–15.9)
RBC.: 2.67 MIL/uL — ABNORMAL LOW (ref 3.87–5.11)
Retic Count, Absolute: 27.2 K/uL (ref 19.0–186.0)
Retic Ct Pct: 1 % (ref 0.4–3.1)

## 2024-03-29 LAB — FOLATE: Folate: 3.3 ng/mL — ABNORMAL LOW (ref >5.9)

## 2024-03-29 LAB — VITAMIN B12: Vitamin B-12: 289 pg/mL (ref 180–914)

## 2024-03-29 LAB — VITAMIN D 25 HYDROXY (VIT D DEFICIENCY, FRACTURES): Vit D, 25-Hydroxy: 21.9 ng/mL — ABNORMAL LOW (ref 30–100)

## 2024-03-29 MED ORDER — VITAMIN B-12 1000 MCG PO TABS
1000.0000 ug | ORAL_TABLET | Freq: Every day | ORAL | Status: DC
  Administered 2024-03-29 – 2024-04-01 (×7): 1000 ug via ORAL
  Filled 2024-03-29 (×4): qty 1

## 2024-03-29 MED ORDER — THIAMINE MONONITRATE 100 MG PO TABS
100.0000 mg | ORAL_TABLET | Freq: Every day | ORAL | Status: DC
  Administered 2024-04-01: 100 mg via ORAL
  Filled 2024-03-29 (×3): qty 1

## 2024-03-29 MED ORDER — FERROUS SULFATE 325 (65 FE) MG PO TABS
325.0000 mg | ORAL_TABLET | Freq: Every day | ORAL | Status: DC
  Administered 2024-03-30 – 2024-04-01 (×5): 325 mg via ORAL
  Filled 2024-03-29 (×3): qty 1

## 2024-03-29 MED ORDER — LORAZEPAM 1 MG PO TABS
1.0000 mg | ORAL_TABLET | ORAL | Status: DC | PRN
  Administered 2024-03-29 – 2024-03-30 (×4): 1 mg via ORAL
  Filled 2024-03-29 (×3): qty 1

## 2024-03-29 MED ORDER — ADULT MULTIVITAMIN W/MINERALS CH
1.0000 | ORAL_TABLET | Freq: Every day | ORAL | Status: DC
  Administered 2024-03-29 – 2024-04-01 (×7): 1 via ORAL
  Filled 2024-03-29 (×4): qty 1

## 2024-03-29 MED ORDER — THIAMINE HCL 100 MG/ML IJ SOLN
100.0000 mg | Freq: Every day | INTRAMUSCULAR | Status: AC
  Administered 2024-03-29 – 2024-04-01 (×7): 100 mg via INTRAVENOUS
  Filled 2024-03-29 (×4): qty 2

## 2024-03-29 MED ORDER — SODIUM CHLORIDE 0.9 % IV SOLN
1.0000 g | INTRAVENOUS | Status: DC
  Administered 2024-03-29 – 2024-03-31 (×6): 1 g via INTRAVENOUS
  Filled 2024-03-29 (×4): qty 10

## 2024-03-29 MED ORDER — METOPROLOL TARTRATE 5 MG/5ML IV SOLN
2.5000 mg | Freq: Once | INTRAVENOUS | Status: AC
  Administered 2024-03-29 (×2): 2.5 mg via INTRAVENOUS
  Filled 2024-03-29: qty 5

## 2024-03-29 MED ORDER — LORAZEPAM 2 MG/ML IJ SOLN
1.0000 mg | INTRAMUSCULAR | Status: DC | PRN
  Administered 2024-03-30 (×2): 1 mg via INTRAVENOUS
  Filled 2024-03-29 (×2): qty 1

## 2024-03-29 MED ORDER — FOLIC ACID 1 MG PO TABS
1.0000 mg | ORAL_TABLET | Freq: Every day | ORAL | Status: DC
  Administered 2024-03-29 – 2024-04-01 (×7): 1 mg via ORAL
  Filled 2024-03-29 (×4): qty 1

## 2024-03-29 MED ORDER — DIAZEPAM 2 MG PO TABS
2.0000 mg | ORAL_TABLET | Freq: Every day | ORAL | Status: DC
  Administered 2024-03-29 (×2): 2 mg via ORAL
  Filled 2024-03-29: qty 1

## 2024-03-29 NOTE — Plan of Care (Signed)
  Problem: Acute Rehab OT Goals (only OT should resolve) Goal: Pt. Will Perform Grooming Flowsheets (Taken 03/29/2024 1207) Pt Will Perform Grooming: Independently Goal: Pt. Will Transfer To Toilet Flowsheets (Taken 03/29/2024 1207) Pt Will Transfer to Toilet: Independently Goal: Pt/Caregiver Will Perform Home Exercise Program Flowsheets (Taken 03/29/2024 1207) Pt/caregiver will Perform Home Exercise Program:  Increased strength  Left upper extremity  Independently  Janziel Hockett OT, MOT

## 2024-03-29 NOTE — Evaluation (Signed)
 Physical Therapy Evaluation Patient Details Name: Stacy [name] MRN: [id number] DOB: [birth year] Today's Date: 03/29/2024  History of Present Illness  Stacy [name] is a 43 y.o. female with medical history significant of gastric bypass who presented with discomfort in all extremities as well as paresthesias and sensory changes.  Patient had a fall and gait changes.  She presented to the ER where she was found to be afebrile hemodynamically stable.  She endorses drinking 10-15 drinks per week.  Labs were obtained and was indication which showed urinalysis concerning for infection, AST 67, ALT 40, hemoglobin 10.8, MCV 110, UDS negative, TSH 3.4, B12 312, free T4 0.88, magnesium  1.5.   Clinical Impression  Patient demonstrates fair/good return for sitting up at bedside, but has to lean on nearby objects for support when standing taking steps.  Patient very unsteady on feet with frequent veering left/right requiring use of RW and followed with w/c for safety.  Patient appears to have incoordination of legs when taking steps and limited mostly due to fatigue. Patient tolerated sitting up in chair after therapy. Patient will benefit from continued skilled physical therapy in hospital and recommended venue below to increase strength, balance, endurance for safe ADLs and gait.        If plan is discharge home, recommend the following: A lot of help with walking and/or transfers;A little help with bathing/dressing/bathroom;Help with stairs or ramp for entrance;Assistance with cooking/housework;Assist for transportation   Can travel by private vehicle        Equipment Recommendations Rolling walker (2 wheels)  Recommendations for Other Services       Functional Status Assessment Patient has had a recent decline in their functional status and demonstrates the ability to make significant improvements in function in a reasonable and predictable amount of time.     Precautions / Restrictions  Precautions Precautions: Fall Recall of Precautions/Restrictions: Intact Restrictions Weight Bearing Restrictions Per Provider Order: No      Mobility  Bed Mobility Overal bed mobility: Modified Independent             General bed mobility comments: had to use bed rail    Transfers Overall transfer level: Needs assistance Equipment used: None Transfers: Sit to/from Stand, Bed to chair/wheelchair/BSC Sit to Stand: Contact guard assist   Step pivot transfers: Min assist       General transfer comment: slow labored movement frequeing having to lean on nearby objects for support    Ambulation/Gait Ambulation/Gait assistance: Min assist Gait Distance (Feet): 45 Feet Assistive device: Rolling walker (2 wheels), None, 1 person hand held assist Gait Pattern/deviations: Decreased step length - right, Decreased step length - left, Decreased stride length, Scissoring, Ataxic, Staggering right, Staggering left Gait velocity: decreased     General Gait Details: slow labored movement with frequent leaning on nearby objects for support, ataxic like movement with leaning right/left, required use of RW for safty due to fall risk  Stairs            Wheelchair Mobility     Tilt Bed    Modified Rankin (Stroke Patients Only)       Balance Overall balance assessment: Needs assistance Sitting-balance support: Feet unsupported, No upper extremity supported Sitting balance-Leahy Scale: Good Sitting balance - Comments: seated at EOB   Standing balance support: During functional activity, No upper extremity supported Standing balance-Leahy Scale: Poor Standing balance comment: fair/poor using RW  Pertinent Vitals/Pain Pain Assessment Pain Assessment: 0-10 Pain Score: 10-Worst pain ever Pain Location: all over Pain Descriptors / Indicators: Burning, Throbbing Pain Intervention(s): Monitored during session, Repositioned     Home Living Family/patient expects to be discharged to:: Private residence Living Arrangements: Children Available Help at Discharge: Family;Available 24 hours/day Type of Home: House Home Access: Stairs to enter Entrance Stairs-Rails: Can reach both;Right;Left Entrance Stairs-Number of Steps: 6-7   Home Layout: One level Home Equipment: None      Prior Function Prior Level of Function : Independent/Modified Independent             Mobility Comments: Community ambulator without AD; works ADLs Comments: Independent     Extremity/Trunk Assessment   Upper Extremity Assessment Upper Extremity Assessment: Defer to OT evaluation RUE Deficits / Details: WFL LUE Deficits / Details: 3+/5 shoudler flexion and abduction. WFL otherwise. LUE Sensation: WNL (Pt reports tingling all over her body.) LUE Coordination: WNL    Lower Extremity Assessment Lower Extremity Assessment: Generalized weakness;LLE deficits/detail LLE Deficits / Details: grossly 4/5 LLE Sensation: WNL LLE Coordination: decreased gross motor    Cervical / Trunk Assessment Cervical / Trunk Assessment: Normal  Communication   Communication Communication: No apparent difficulties    Cognition Arousal: Alert Behavior During Therapy: WFL for tasks assessed/performed   PT - Cognitive impairments: No apparent impairments                         Following commands: Intact       Cueing Cueing Techniques: Verbal cues     General Comments      Exercises     Assessment/Plan    PT Assessment Patient needs continued PT services  PT Problem List Decreased strength;Decreased activity tolerance;Decreased balance;Decreased mobility       PT Treatment Interventions DME instruction;Gait training;Stair training;Functional mobility training;Therapeutic activities;Therapeutic exercise;Balance training;Patient/family education    PT Goals (Current goals can be found in the Care Plan section)   Acute Rehab PT Goals Patient Stated Goal: return home with family PT Goal Formulation: With patient Time For Goal Achievement: 04/12/24 Potential to Achieve Goals: Good    Frequency Min 3X/week     Co-evaluation PT/OT/SLP Co-Evaluation/Treatment: Yes Reason for Co-Treatment: To address functional/ADL transfers PT goals addressed during session: Mobility/safety with mobility;Balance;Proper use of DME OT goals addressed during session: ADL's and self-care       AM-PAC PT 6 Clicks Mobility  Outcome Measure Help needed turning from your back to your side while in a flat bed without using bedrails?: None Help needed moving from lying on your back to sitting on the side of a flat bed without using bedrails?: None Help needed moving to and from a bed to a chair (including a wheelchair)?: A Little Help needed standing up from a chair using your arms (e.g., wheelchair or bedside chair)?: A Little Help needed to walk in hospital room?: A Lot Help needed climbing 3-5 steps with a railing? : A Lot 6 Click Score: 18    End of Session Equipment Utilized During Treatment: Gait belt Activity Tolerance: Patient tolerated treatment well;Patient limited by fatigue Patient left: in chair;with call bell/phone within reach Nurse Communication: Mobility status PT Visit Diagnosis: Unsteadiness on feet (R26.81);Other abnormalities of gait and mobility (R26.89);Muscle weakness (generalized) (M62.81)    Time: 9041-8973 PT Time Calculation (min) (ACUTE ONLY): 28 min   Charges:   PT Evaluation $PT Eval Moderate Complexity: 1 Mod PT Treatments $Therapeutic Activity: 23-37 mins  PT General Charges $$ ACUTE PT VISIT: 1 Visit         1:45 PM, 03/29/24 Lynwood Music, MPT Physical Therapist with [facility] 336 [phone] office [phone] mobile phone

## 2024-03-29 NOTE — Progress Notes (Addendum)
 PROGRESS NOTE  Stacy [name], is a 43 y.o. female, DOB - Jul 05, 1981, FMW:[id number]  Admit date - 03/28/2024   Admitting Physician Lamar Dess, MD  Outpatient Primary MD for the patient is [facility]  LOS - 1  Chief Complaint  Patient presents with   Numbness      Brief Narrative:  43 y.o. female with medical history significant of gastric bypass who presented with discomfort in all extremities as well as paresthesias and sensory changes admitted with same concerns and also concerns about possible acute neurological deficits and possible UTI   -Assessment and Plan: 1)Acute Neurological Deficits--patient with subtle right-sided weakness - Patient generalized weakness, she is very unsteady and shaky =-MRI brain without contrast without acute intracranial abnormality - CTA head and neck without LVO or high-grade stenosis or aneurysmal dilatation or dissection involving the arteries of the head or neck - Patient with neck and lower back discomfort--get MRI of the C-spine and L-spine with and without contrast -Patient also with myalgias check total CK check CRP and ESR -TSH and free T4 WNL - PT OT eval appreciated recommends CIR - Per CIR patient not a candidate for them TOC will try to look for SNF rehab  2)Possible UTI--- IV Rocephin  pending culture data  3)s/p  post gastric bypass--with concerns for malabsorption and vitamin deficiencies - Ferritin is not low, serum iron is not low but TIBC is low - B12 is low normal, folate is low, vitamin D  is low - Vitamin D , vitamin B vitamin K and copper  are pending - Magnesium  low at 1.5,   4) chronic anemia--in the setting of prior gastric bypass - Please see #3 above - No acute bleeding concerns - Hemoglobin currently 10.8 - Give B12, folate, multivitamin and iron  5)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  6) alcohol abuse--- thiamine  and folic acid  as ordered - Lorazepam  per CIWA protocol  7) hypomagnesemia--in the setting of alcohol abuse, replaced  8) alcoholic transaminitis/fatty liver-- AST >>> ALT, elevated T. bili at 2.7 noted  Status is: Inpatient   Disposition: The patient is from: Home              Anticipated d/c is to: SNF              Anticipated d/c date is: 2 days              Patient currently is not medically stable to d/c. Barriers: Not Clinically Stable-   Code Status :  -  Code Status: Full Code   Family Communication:    NA (patient is alert, awake and coherent)   DVT Prophylaxis  :   - SCDs   heparin  injection 5,000 Units Start: 03/28/24 2230 SCDs Start: 03/28/24 2134   Lab Results  Component Value Date   PLT 309 03/28/2024    Inpatient Medications  Scheduled Meds:  vitamin B-12  1,000 mcg Oral Daily   Fe Fum-Vit C-Vit B12-FA  1 capsule Oral QPC breakfast   folic acid   1 mg Oral Daily   gabapentin   300 mg Oral TID   heparin   5,000 Units Subcutaneous Q8H   Continuous Infusions:  cefTRIAXone  (ROCEPHIN )  IV 1 g (03/29/24 0941)   PRN Meds:.acetaminophen  **OR** acetaminophen , ondansetron  **OR** ondansetron  (ZOFRAN ) IV, oxyCODONE    Anti-infectives (From admission, onward)    Start     Dose/Rate Route Frequency Ordered  Stop   03/29/24 1015  cefTRIAXone  (ROCEPHIN ) 1 g in sodium chloride  0.9 % 100 mL IVPB        1 g 200 mL/hr over 30 Minutes Intravenous Every 24 hours 03/29/24 0926     03/28/24 1515  cephALEXin  (KEFLEX ) capsule 500 mg        500 mg Oral  Once 03/28/24 1513 03/28/24 1549        Subjective: Stacy [name] today has no fevers, no emesis,  No chest pain,   - Feels weak, unsteady, - Reports urinary frequency  Objective: Vitals:   03/28/24 2035 03/28/24 2131 03/29/24 0419 03/29/24 1428  BP: (!) 124/95  (!) 135/101 123/89  Pulse: 100  99 100  Resp: 20  19 18   Temp: 98.3 F (36.8 C)  97.9 F (36.6  C)   TempSrc: Oral  Oral   SpO2: 100% 100% 99% 99%  Weight: 87.9 kg     Height: 5' 2 (1.575 m)       Intake/Output Summary (Last 24 hours) at 03/29/2024 1849 Last data filed at 03/29/2024 1829 Gross per 24 hour  Intake 724.64 ml  Output --  Net 724.64 ml   Filed Weights   03/28/24 1237 03/28/24 2035  Weight: 91.6 kg 87.9 kg    Physical Exam  Gen:- Awake Alert, no acute distress HEENT:- [place].AT,  +ve sclera icterus, edentulous Neck-Supple Neck,No JVD,.  Lungs-  CTAB , fair symmetrical air movement CV- S1, S2 normal, regular  Abd-  +ve B.Sounds, Abd Soft, No tenderness,    Extremity/Skin:- No  edema, pedal pulses present  Psych-affect is appropriate, oriented x3 Neuro-generalized weakness, subtle right-sided weakness, , very unsteady and shaky  Data Reviewed: I have personally reviewed following labs and imaging studies  CBC: Recent Labs  Lab 03/28/24 1244  WBC 4.8  HGB 10.8*  HCT 31.8*  MCV 110.0*  PLT 309   Basic Metabolic Panel: Recent Labs  Lab 03/28/24 1244 03/28/24 1428  NA 135  --   K 3.6  --   CL 99  --   CO2 22  --   GLUCOSE 102*  --   BUN 7  --   CREATININE 0.54  --   CALCIUM 9.0  --   MG  --  1.5*   GFR: Estimated Creatinine Clearance: 93.3 mL/min (by C-G formula based on SCr of 0.54 mg/dL). Liver Function Tests: Recent Labs  Lab 03/28/24 1244  AST 67*  ALT 40  ALKPHOS 80  BILITOT 2.7*  PROT 7.1  ALBUMIN 3.5   Radiology Studies: MR BRAIN WO CONTRAST Result Date: 03/28/2024 CLINICAL DATA:  Initial evaluation for acute neuro deficit, stroke suspected. EXAM: MRI HEAD WITHOUT CONTRAST TECHNIQUE: Multiplanar, multiecho pulse sequences of the brain and surrounding structures were obtained without intravenous contrast. COMPARISON:  CTs from earlier the same day. FINDINGS: Brain: Cerebral volume within normal limits for age. No focal parenchymal signal abnormality. No abnormal foci of restricted diffusion to suggest acute or subacute ischemia.  Gray-white matter differentiation well maintained. No encephalomalacia to suggest chronic cortical infarction or other insult. No foci of susceptibility artifact indicative of acute or chronic intracranial blood products. No mass lesion, midline shift or mass effect. Ventricles normal in size and morphology without hydrocephalus. No extra-axial fluid collection. Pituitary gland and suprasellar region within normal limits. Vascular: Major intracranial vascular flow voids are well maintained. Skull and upper cervical spine: Craniocervical junction within normal limits. Decreased T1 signal intensity within the visualized bone marrow, nonspecific, but most  commonly related to anemia, smoking or obesity. No scalp soft tissue abnormality. Sinuses/Orbits: Globes and orbital soft tissues are within normal limits. Paranasal sinuses are largely clear. No significant mastoid effusion. Other: None. IMPRESSION: Normal brain MRI. No acute intracranial abnormality identified. Electronically Signed   By: Morene Hoard M.D.   On: 03/28/2024 19:39   CT Angio Head Neck W WO CM Result Date: 03/28/2024 EXAM: CTA Head and Neck with Intravenous Contrast. CT Head without Contrast. CLINICAL HISTORY: Neuro deficit, acute, stroke suspected; c/o numbness and tingling since Wednesday (5 days), progressively worsening, with weakness traveling down her body to both feet. Generalized body aches. Pt also states she has noticed difficulty controlling her saliva; she is holding a washcloth against her mouth while talking due to drool. Denies slurred speech, no facial droop, no visual changes. Pt reports gait instability. TECHNIQUE: Axial CTA images of the head and neck performed with intravenous contrast. MIP reconstructed images were created and reviewed. Axial computed tomography images of the head/brain performed without intravenous contrast. Note: Per PQRS, the description of internal carotid artery percent stenosis, including 0 percent  or normal exam, is based on [place] American Symptomatic Carotid Endarterectomy Trial (NASCET) criteria. Dose reduction technique was used including one or more of the following: automated exposure control, adjustment of mA and kV according to patient size, and/or iterative reconstruction. CONTRAST: Without and with; 75mL (iohexol  (OMNIPAQUE ) 350 MG/ML injection 75 mL IOHEXOL  350 MG/ML SOLN) COMPARISON: Same day CT head. FINDINGS: CT HEAD: BRAIN: No acute intraparenchymal hemorrhage. No mass lesion. No CT evidence for acute territorial infarct. No midline shift or extra-axial collection. VENTRICLES: No hydrocephalus. ORBITS: The orbits are unremarkable. SINUSES AND MASTOIDS: Clear. CTA NECK: COMMON CAROTID ARTERIES: No significant stenosis. No dissection or occlusion. INTERNAL CAROTID ARTERIES: No stenosis by NASCET criteria. No dissection or occlusion. VERTEBRAL ARTERIES: No significant stenosis. No dissection or occlusion. CTA HEAD: ANTERIOR CEREBRAL ARTERIES: No significant stenosis. No occlusion. No aneurysm. MIDDLE CEREBRAL ARTERIES: No significant stenosis. No occlusion. No aneurysm. POSTERIOR CEREBRAL ARTERIES: No significant stenosis. No occlusion. No aneurysm. BASILAR ARTERY: No significant stenosis. No occlusion. No aneurysm. SOFT TISSUES: No acute finding. No masses or lymphadenopathy. BONES: No acute osseous abnormality. Degenerative endplate osteophytes at C5-6. Edentulous maxilla and mandible. IMPRESSION: 1. No large vessel occlusion, high-grade stenosis, aneurysmal dilatation, or dissection involving the arteries of the head and neck. Electronically signed by: Donnice Mania MD 03/28/2024 07:03 PM EDT RP Workstation: [id number]   CT Head Wo Contrast Result Date: 03/28/2024 CLINICAL DATA:  Neuro deficit, acute, stroke suspected Numbness and tingling for 5 days. EXAM: CT HEAD WITHOUT CONTRAST TECHNIQUE: Contiguous axial images were obtained from the base of the skull through the vertex without  intravenous contrast. RADIATION DOSE REDUCTION: This exam was performed according to the departmental dose-optimization program which includes automated exposure control, adjustment of the mA and/or kV according to patient size and/or use of iterative reconstruction technique. COMPARISON:  Head CT 08/31/2020 FINDINGS: Brain: No intracranial hemorrhage, mass effect, or midline shift. No hydrocephalus. The basilar cisterns are patent. No evidence of territorial infarct or acute ischemia. No extra-axial or intracranial fluid collection. Vascular: No hyperdense vessel or unexpected calcification. Skull: No fracture or focal lesion. Sinuses/Orbits: Minimal opacification of lower right mastoid air cells. The paranasal sinuses are clear. Other: None. IMPRESSION: No acute intracranial abnormality. Electronically Signed   By: Andrea Gasman M.D.   On: 03/28/2024 14:34   Scheduled Meds:  vitamin B-12  1,000 mcg Oral Daily   Fe Fum-Vit  C-Vit B12-FA  1 capsule Oral QPC breakfast   folic acid   1 mg Oral Daily   gabapentin   300 mg Oral TID   heparin   5,000 Units Subcutaneous Q8H   Continuous Infusions:  cefTRIAXone  (ROCEPHIN )  IV 1 g (03/29/24 0941)    LOS: 1 day   Rendall Carwin M.D on 03/29/2024 at 6:49 PM  Go to [email or web address] - for contact info  [facility] - Office  [phone]  If 7PM-7AM, please contact night-coverage [email or web address] 03/29/2024, 6:49 PM

## 2024-03-29 NOTE — Plan of Care (Signed)
   Problem: Activity: Goal: Risk for activity intolerance will decrease Outcome: Progressing   Problem: Coping: Goal: Level of anxiety will decrease Outcome: Progressing

## 2024-03-29 NOTE — Progress Notes (Signed)
 Inpatient Rehab Admissions Coordinator:   Per therapy recommendations, patient was screened for CIR candidacy by Leita Kleine, MS, CCC-SLP. At this time, Pt. does not appear to demonstrate medical necessity to justify in hospital rehabilitation/CIR. will not pursue a rehab consult for this Pt.   Recommend other rehab venues to be pursued.  Please contact me with any questions.  Leita Kleine, MS, CCC-SLP Rehab Admissions Coordinator  [phone] (celll) [phone] (office)

## 2024-03-29 NOTE — Plan of Care (Signed)
  Problem: Acute Rehab PT Goals(only PT should resolve) Goal: Pt Will Go Supine/Side To Sit Outcome: Progressing Flowsheets (Taken 03/29/2024 1347) Pt will go Supine/Side to Sit: Independently Goal: Patient Will Transfer Sit To/From Stand Outcome: Progressing Flowsheets (Taken 03/29/2024 1347) Patient will transfer sit to/from stand:  with supervision  with contact guard assist Goal: Pt Will Transfer Bed To Chair/Chair To Bed Outcome: Progressing Flowsheets (Taken 03/29/2024 1347) Pt will Transfer Bed to Chair/Chair to Bed:  with supervision  with contact guard assist Goal: Pt Will Ambulate Outcome: Progressing Flowsheets (Taken 03/29/2024 1347) Pt will Ambulate:  75 feet  with supervision  with contact guard assist  with rolling walker  with least restrictive assistive device   1:48 PM, 03/29/24 Lynwood Music, MPT Physical Therapist with [facility] 336 [phone] office [phone] mobile phone

## 2024-03-29 NOTE — Plan of Care (Signed)
   Problem: Education: Goal: Knowledge of General Education information will improve Description Including pain rating scale, medication(s)/side effects and non-pharmacologic comfort measures Outcome: Progressing

## 2024-03-29 NOTE — TOC Initial Note (Signed)
 Transition of Care [facility]) - Initial/Assessment Note    Patient Details  Name: Stacy [name] MRN: [id number] Date of Birth: [birth year]  Transition of Care [facility]) CM/SW Contact:    Mcarthur Saddie Kim, LCSW Phone Number: 03/29/2024, 2:48 PM  Clinical Narrative: Pt admitted due to CVA. PT/OT evaluated pt and recommend CIR. However, CIR does not feel pt is a candidate. LCSW discussed options with pt and she would like to consider SNF. Discussed placement process. Requests [employer]. Referral sent. TOC will follow up in AM.                   Expected Discharge Plan: Skilled Nursing Facility Barriers to Discharge: Continued Medical Work up   Patient Goals and CMS Choice Patient states their goals for this hospitalization and ongoing recovery are:: short term rehab   Choice offered to / list presented to : Patient [place] ownership interest in [facility].provided to:: Patient    Expected Discharge Plan and Services In-house Referral: Clinical Social Work   Post Acute Care Choice: Skilled Nursing Facility Living arrangements for the past 2 months: Single Family Home                                      Prior Living Arrangements/Services Living arrangements for the past 2 months: Single Family Home Lives with:: Minor Children Patient language and need for interpreter reviewed:: Yes Do you feel safe going back to the place where you live?: Yes      Need for Family Participation in Patient Care: No (Comment)     Criminal Activity/Legal Involvement Pertinent to Current Situation/Hospitalization: No - Comment as needed  Activities of Daily Living   ADL Screening (condition at time of admission) Independently performs ADLs?: No Does the patient have a NEW difficulty with bathing/dressing/toileting/self-feeding that is expected to last >3 days?: Yes (Initiates electronic notice to provider for possible OT consult) Does the patient have a NEW  difficulty with getting in/out of bed, walking, or climbing stairs that is expected to last >3 days?: Yes (Initiates electronic notice to provider for possible PT consult) Does the patient have a NEW difficulty with communication that is expected to last >3 days?: No Is the patient deaf or have difficulty hearing?: No Does the patient have difficulty seeing, even when wearing glasses/contacts?: No Does the patient have difficulty concentrating, remembering, or making decisions?: No  Permission Sought/Granted                  Emotional Assessment     Affect (typically observed): Appropriate Orientation: : Oriented to Self, Oriented to Place, Oriented to  Time, Oriented to Situation Alcohol / Substance Use: Not Applicable Psych Involvement: No (comment)  Admission diagnosis:  Paresthesia [R20.2] Ataxia [R27.0] CVA (cerebrovascular accident) (HCC) [I63.9] Acute cystitis without hematuria [N30.00] Patient Active Problem List   Diagnosis Date Noted   CVA (cerebrovascular accident) (HCC) 03/28/2024   Supratherapeutic INR 06/02/2019   Leukocytosis 01/09/2019   Alcohol abuse 01/09/2019   Elevated LFTs 01/09/2019   Hepatic steatosis 01/09/2019   Alcohol dependence with uncomplicated withdrawal (HCC) 01/09/2019   Alcohol withdrawal (HCC) 01/09/2019   Vitamin D  deficiency 01/09/2019   Folic acid  deficiency 01/09/2019   Non-sustained ventricular tachycardia (HCC) 01/09/2019   Lactic acidosis 01/09/2019   Pulmonary embolism (HCC) 01/09/2019   Acute metabolic encephalopathy 01/09/2019   Closed fracture of distal end of left humerus  04/29/18 05/06/2018   PCP:  Practice, [facility]:   [facility] [address] [place] [address] [place] [phone] [place] [address] Phone: [phone] Fax: [phone]     Social Drivers of Health (SDOH) Social History: SDOH Screenings   Food Insecurity: No Food Insecurity (03/28/2024)  Housing: Low Risk   (03/28/2024)  Transportation Needs: No Transportation Needs (03/28/2024)  Utilities: Not At Risk (03/28/2024)  Financial Resource Strain: Low Risk  (06/02/2019)  Physical Activity: Insufficiently Active (06/02/2019)  Social Connections: Unknown (01/01/2022)   Received from [facility]  Stress: No Stress Concern Present (06/02/2019)  Tobacco Use: High Risk (03/28/2024)   SDOH Interventions:     Readmission Risk Interventions     No data to display

## 2024-03-29 NOTE — Evaluation (Signed)
 Occupational Therapy Evaluation Patient Details Name: Stacy [name] MRN: [id number] DOB: 1980/12/30 Today's Date: 03/29/2024   History of Present Illness   Stacy [name] is a 43 y.o. female with medical history significant of gastric bypass who presented with discomfort in all extremities as well as paresthesias and sensory changes.  Patient had a fall and gait changes.  She presented to the ER where she was found to be afebrile hemodynamically stable.  She endorses drinking 10-15 drinks per week.  Labs were obtained and was indication which showed urinalysis concerning for infection, AST 67, ALT 40, hemoglobin 10.8, MCV 110, UDS negative, TSH 3.4, B12 312, free T4 0.88, magnesium  1.5. (per MD)     Clinical Impressions Pt agreeable to OT and PT co-evaluation. Pt reports being independent at baseline and working. Today pt presented with L shoulder weakness and reports of whole body pain and tingling. Pt required min A for functional ambulation with and without RW. Very unsteady without AD and noted start and stop with slight loss of balance even with RW. No assist needed for seated ADL's. Pt left in the chair with call bell within reach. Pt will benefit from continued OT in the hospital and recommended venue below to increase strength, balance, and endurance for safe ADL's.        If plan is discharge home, recommend the following:   A little help with walking and/or transfers;A little help with bathing/dressing/bathroom;Assistance with cooking/housework;Assist for transportation;Help with stairs or ramp for entrance     Functional Status Assessment   Patient has had a recent decline in their functional status and demonstrates the ability to make significant improvements in function in a reasonable and predictable amount of time.     Equipment Recommendations   None recommended by OT     Recommendations for Other Services   Rehab consult      Precautions/Restrictions   Precautions Precautions: Fall Recall of Precautions/Restrictions: Intact Restrictions Weight Bearing Restrictions Per Provider Order: No     Mobility Bed Mobility Overal bed mobility: Modified Independent             General bed mobility comments: use of railing    Transfers Overall transfer level: Needs assistance Equipment used: None Transfers: Sit to/from Stand, Bed to chair/wheelchair/BSC Sit to Stand: Contact guard assist     Step pivot transfers: Min assist     General transfer comment: unsteady movement EOB to chair without RW      Balance Overall balance assessment: Needs assistance Sitting-balance support: No upper extremity supported, Feet supported Sitting balance-Leahy Scale: Good Sitting balance - Comments: seated at EOB   Standing balance support: Bilateral upper extremity supported, During functional activity Standing balance-Leahy Scale: Poor Standing balance comment: poor without AD; poor to fair with RW                           ADL either performed or assessed with clinical judgement   ADL Overall ADL's : Needs assistance/impaired Eating/Feeding: Independent   Grooming: Minimal assistance;Contact guard assist;Standing   Upper Body Bathing: Set up;Sitting   Lower Body Bathing: Set up;Sitting/lateral leans   Upper Body Dressing : Set up;Sitting   Lower Body Dressing: Set up;Sitting/lateral leans   Toilet Transfer: Minimal assistance;Contact guard assist;Stand-pivot Toilet Transfer Details (indicate cue type and reason): EOB to chair without AD Toileting- Clothing Manipulation and Hygiene: Set up;Sitting/lateral lean   Tub/ Shower Transfer: Minimal assistance;Ambulation;Rolling walker (2 wheels)   Functional  mobility during ADLs: Minimal assistance;Rolling walker (2 wheels) General ADL Comments: Able to walk in hall over 50 feet without AD, but unsteady with frequent stops and balance  deficits.     Vision Baseline Vision/History: 0 No visual deficits Ability to See in Adequate Light: 0 Adequate Patient Visual Report: No change from baseline Vision Assessment?: No apparent visual deficits     Perception Perception: Not tested       Praxis Praxis: Not tested       Pertinent Vitals/Pain Pain Assessment Pain Assessment: 0-10 Pain Score: 10-Worst pain ever Pain Location: all over Pain Descriptors / Indicators: Burning, Throbbing Pain Intervention(s): Monitored during session, Repositioned     Extremity/Trunk Assessment Upper Extremity Assessment Upper Extremity Assessment: LUE deficits/detail;RUE deficits/detail RUE Deficits / Details: WFL LUE Deficits / Details: 3+/5 shoudler flexion and abduction. WFL otherwise. LUE Sensation: WNL (Pt reports tingling all over her body.) LUE Coordination: WNL   Lower Extremity Assessment Lower Extremity Assessment: Defer to PT evaluation   Cervical / Trunk Assessment Cervical / Trunk Assessment: Normal   Communication Communication Communication: No apparent difficulties   Cognition Arousal: Alert Behavior During Therapy: WFL for tasks assessed/performed Cognition: No apparent impairments                               Following commands: Intact       Cueing  General Comments   Cueing Techniques: Verbal cues                 Home Living Family/patient expects to be discharged to:: Private residence Living Arrangements: Children Available Help at Discharge: Family;Available 24 hours/day Type of Home: House Home Access: Stairs to enter [employer] of Steps: 6-7 Entrance Stairs-Rails: Can reach both;Right;Left Home Layout: One level     Bathroom Shower/Tub: Chief Strategy Officer: Standard Bathroom Accessibility: Yes How Accessible: Accessible via wheelchair;Accessible via walker Home Equipment: None          Prior Functioning/Environment Prior Level  of Function : Independent/Modified Independent             Mobility Comments: Community ambulator without AD; works ADLs Comments: Independent    OT Problem List: Decreased strength;Decreased activity tolerance;Pain;Impaired balance (sitting and/or standing)   OT Treatment/Interventions: Self-care/ADL training;Therapeutic exercise;DME and/or AE instruction;Neuromuscular education;Therapeutic activities;Patient/family education;Balance training      OT Goals(Current goals can be found in the care plan section)   Acute Rehab OT Goals Patient Stated Goal: improve function OT Goal Formulation: With patient Time For Goal Achievement: 04/12/24 Potential to Achieve Goals: Good   OT Frequency:  Min 3X/week    Co-evaluation PT/OT/SLP Co-Evaluation/Treatment: Yes Reason for Co-Treatment: To address functional/ADL transfers   OT goals addressed during session: ADL's and self-care                       End of Session Equipment Utilized During Treatment: Rolling walker (2 wheels);Gait belt  Activity Tolerance: Patient tolerated treatment well Patient left: in chair;with call bell/phone within reach  OT Visit Diagnosis: Unsteadiness on feet (R26.81);Other abnormalities of gait and mobility (R26.89);Muscle weakness (generalized) (M62.81);Other symptoms and signs involving the nervous system (R29.898)                Time: 8995-8973 OT Time Calculation (min): 22 min Charges:  OT General Charges $OT Visit: 1 Visit OT Evaluation $OT Eval Low Complexity: 1 Low  Bland Rudzinski OT, MOT  Jayson Person 03/29/2024, 12:04 PM

## 2024-03-29 NOTE — Progress Notes (Signed)
 Transition of Care [facility]) has reviewed patient and no other TOC needs have been identified at this time. We will continue to monitor patient advancement through interdisciplinary progression rounds. If new patient transition needs arise, please place a TOC consult.   03/29/24 0802  TOC Brief Assessment  Insurance and Status Reviewed  Patient has primary care physician Yes  Home environment has been reviewed Minor children in home.  Prior level of function: Independent.  Prior/Current Home Services No current home services  Social Drivers of Health Review SDOH reviewed no interventions necessary  Readmission risk has been reviewed Yes  Transition of care needs no transition of care needs at this time

## 2024-03-29 NOTE — [place] FL2 (Signed)
 [place]  MEDICAID FL2 LEVEL OF CARE FORM     IDENTIFICATION  Patient Name: Stacy [name] Birthdate: [birth year] Sex: female Admission Date (Current Location): 03/28/2024  [place] and [place] Number:  [place] [id number] K Facility and Address:  [facility],  [address] [place] [address]      Provider Number: [id number]  Attending Physician Name and Address:  Pearlean Manus, MD  Relative Name and Phone Number:       Current Level of Care: Hospital Recommended Level of Care: Skilled Nursing Facility Prior Approval Number:    Date Approved/Denied:   PASRR Number: [id number]  Discharge Plan: SNF    Current Diagnoses: Patient Active Problem List   Diagnosis Date Noted   CVA (cerebrovascular accident) (HCC) 03/28/2024   Supratherapeutic INR 06/02/2019   Leukocytosis 01/09/2019   Alcohol abuse 01/09/2019   Elevated LFTs 01/09/2019   Hepatic steatosis 01/09/2019   Alcohol dependence with uncomplicated withdrawal (HCC) 01/09/2019   Alcohol withdrawal (HCC) 01/09/2019   Vitamin D  deficiency 01/09/2019   Folic acid  deficiency 01/09/2019   Non-sustained ventricular tachycardia (HCC) 01/09/2019   Lactic acidosis 01/09/2019   Pulmonary embolism (HCC) 01/09/2019   Acute metabolic encephalopathy 01/09/2019   Closed fracture of distal end of left humerus 04/29/18 05/06/2018    Orientation RESPIRATION BLADDER Height & Weight     Self, Time, Situation, Place  Normal Continent Weight: 193 lb 12.6 oz (87.9 kg) Height:  5' 2 (157.5 cm)  BEHAVIORAL SYMPTOMS/MOOD NEUROLOGICAL BOWEL NUTRITION STATUS      Continent Diet (See d/c summary)  AMBULATORY STATUS COMMUNICATION OF NEEDS Skin   Limited Assist Verbally Normal                       Personal Care Assistance Level of Assistance  Bathing, Feeding, Dressing Bathing Assistance: Limited assistance Feeding assistance: Limited assistance Dressing Assistance: Limited assistance     Functional  Limitations Info  Sight, Hearing, Speech Sight Info: Adequate Hearing Info: Adequate Speech Info: Adequate    SPECIAL CARE FACTORS FREQUENCY  PT (By licensed PT), OT (By licensed OT)     PT Frequency: 5x weekly OT Frequency: 5x weekly            Contractures      Additional Factors Info  Code Status, Allergies Code Status Info: Full Allergies Info: Ibuprofen , Lovenox  (enoxaparin  Sodium)           Current Medications (03/29/2024):  This is the current hospital active medication list Current Facility-Administered Medications  Medication Dose Route Frequency Provider Last Rate Last Admin   acetaminophen  (TYLENOL ) tablet 650 mg  650 mg Oral Q6H PRN Dena Charleston, MD   650 mg at 03/29/24 9065   Or   acetaminophen  (TYLENOL ) suppository 650 mg  650 mg Rectal Q6H PRN Dena Charleston, MD       cefTRIAXone  (ROCEPHIN ) 1 g in sodium chloride  0.9 % 100 mL IVPB  1 g Intravenous Q24H Emokpae, Courage, MD 200 mL/hr at 03/29/24 0941 1 g at 03/29/24 0941   cyanocobalamin  (VITAMIN B12) tablet 1,000 mcg  1,000 mcg Oral Daily Pearlean, Courage, MD   1,000 mcg at 03/29/24 0935   Fe Fum-Vit C-Vit B12-FA (TRIGELS-F FORTE) capsule 1 capsule  1 capsule Oral QPC breakfast Dorrell, Robert, MD       folic acid  (FOLVITE ) tablet 1 mg  1 mg Oral Daily Emokpae, Courage, MD   1 mg at 03/29/24 0934   gabapentin  (NEURONTIN ) capsule 300 mg  300 mg Oral  TID Dena Charleston, MD   300 mg at 03/29/24 9073   heparin  injection 5,000 Units  5,000 Units Subcutaneous Q8H Dorrell, Robert, MD   5,000 Units at 03/29/24 0529   ondansetron  (ZOFRAN ) tablet 4 mg  4 mg Oral Q6H PRN Dena Charleston, MD       Or   ondansetron  (ZOFRAN ) injection 4 mg  4 mg Intravenous Q6H PRN Dena Charleston, MD       oxyCODONE  (Oxy IR/ROXICODONE ) immediate release tablet 5 mg  5 mg Oral Q4H PRN Dena Charleston, MD   5 mg at 03/29/24 9065     Discharge Medications: Please see discharge summary for a list of discharge medications.  Relevant  Imaging Results:  Relevant Lab Results:   Additional Information SSN: [id number]  Mcarthur Saddie Kim, LCSW

## 2024-03-30 ENCOUNTER — Inpatient Hospital Stay (HOSPITAL_COMMUNITY)

## 2024-03-30 DIAGNOSIS — E538 Deficiency of other specified B group vitamins: Secondary | ICD-10-CM | POA: Diagnosis not present

## 2024-03-30 DIAGNOSIS — R269 Unspecified abnormalities of gait and mobility: Secondary | ICD-10-CM | POA: Diagnosis not present

## 2024-03-30 DIAGNOSIS — R531 Weakness: Secondary | ICD-10-CM

## 2024-03-30 DIAGNOSIS — F1010 Alcohol abuse, uncomplicated: Secondary | ICD-10-CM | POA: Diagnosis not present

## 2024-03-30 LAB — COMPREHENSIVE METABOLIC PANEL WITH GFR
ALT: 30 U/L (ref 0–44)
AST: 56 U/L — ABNORMAL HIGH (ref 15–41)
Albumin: 2.9 g/dL — ABNORMAL LOW (ref 3.5–5.0)
Alkaline Phosphatase: 71 U/L (ref 38–126)
Anion gap: 8 (ref 5–15)
BUN: 5 mg/dL — ABNORMAL LOW (ref 6–20)
CO2: 25 mmol/L (ref 22–32)
Calcium: 8.7 mg/dL — ABNORMAL LOW (ref 8.9–10.3)
Chloride: 102 mmol/L (ref 98–111)
Creatinine, Ser: 0.48 mg/dL (ref 0.44–1.00)
GFR, Estimated: >60 mL/min (ref >60)
Glucose, Bld: 89 mg/dL (ref 70–99)
Potassium: 3.4 mmol/L — ABNORMAL LOW (ref 3.5–5.1)
Sodium: 135 mmol/L (ref 135–145)
Total Bilirubin: 1 mg/dL (ref 0.0–1.2)
Total Protein: 5.9 g/dL — ABNORMAL LOW (ref 6.5–8.1)

## 2024-03-30 LAB — MAGNESIUM: Magnesium: 1.7 mg/dL (ref 1.7–2.4)

## 2024-03-30 LAB — CK: Total CK: 14 U/L — ABNORMAL LOW (ref 38–234)

## 2024-03-30 LAB — SEDIMENTATION RATE: Sed Rate: 41 mm/h — ABNORMAL HIGH (ref 0–22)

## 2024-03-30 LAB — C-REACTIVE PROTEIN: CRP: 0.6 mg/dL (ref <1.0)

## 2024-03-30 LAB — PHOSPHORUS: Phosphorus: 3.8 mg/dL (ref 2.5–4.6)

## 2024-03-30 MED ORDER — LORAZEPAM 2 MG/ML IJ SOLN
1.0000 mg | Freq: Once | INTRAMUSCULAR | Status: AC
  Administered 2024-03-30 (×2): 1 mg via INTRAVENOUS

## 2024-03-30 MED ORDER — TRAZODONE HCL 50 MG PO TABS
25.0000 mg | ORAL_TABLET | Freq: Every evening | ORAL | Status: DC | PRN
  Administered 2024-03-30 (×2): 25 mg via ORAL
  Filled 2024-03-30 (×2): qty 1

## 2024-03-30 MED ORDER — POTASSIUM CHLORIDE CRYS ER 20 MEQ PO TBCR
40.0000 meq | EXTENDED_RELEASE_TABLET | ORAL | Status: AC
  Administered 2024-03-30 (×4): 40 meq via ORAL
  Filled 2024-03-30 (×2): qty 2

## 2024-03-30 MED ORDER — TRAZODONE HCL 50 MG PO TABS: 25.0000 mg | ORAL_TABLET | Freq: Every evening | ORAL | Status: DC | PRN

## 2024-03-30 MED ORDER — MAGNESIUM OXIDE -MG SUPPLEMENT 400 (240 MG) MG PO TABS
400.0000 mg | ORAL_TABLET | Freq: Two times a day (BID) | ORAL | Status: DC
  Administered 2024-03-30 – 2024-04-01 (×9): 400 mg via ORAL
  Filled 2024-03-30 (×5): qty 1

## 2024-03-30 MED ORDER — DIAZEPAM 2 MG PO TABS
2.0000 mg | ORAL_TABLET | Freq: Three times a day (TID) | ORAL | Status: DC
  Administered 2024-03-30 – 2024-03-31 (×8): 2 mg via ORAL
  Filled 2024-03-30 (×4): qty 1

## 2024-03-30 MED ORDER — DIAZEPAM 5 MG PO TABS
5.0000 mg | ORAL_TABLET | Freq: Once | ORAL | Status: AC
  Administered 2024-03-30 (×2): 5 mg via ORAL
  Filled 2024-03-30: qty 1

## 2024-03-30 MED ORDER — GADOBUTROL 1 MMOL/ML IV SOLN
9.0000 mL | Freq: Once | INTRAVENOUS | Status: AC | PRN
  Administered 2024-03-30 (×2): 9 mL via INTRAVENOUS

## 2024-03-30 NOTE — Progress Notes (Signed)
 PROGRESS NOTE  Stacy [name], is a 43 y.o. female, DOB - Jan 11, 1981, FMW:[id number]  Admit date - 03/28/2024   Admitting Physician Stacy Dess, MD  Outpatient Primary MD for the patient is [facility], [facility]  LOS - 2  Chief Complaint  Patient presents with   Numbness      Brief Narrative:  43 y.o. female with medical history significant of gastric bypass who presented with discomfort in all extremities as well as paresthesias and sensory changes admitted with same concerns and also concerns about possible acute neurological deficits and possible UTI   -Assessment and Plan: 1)Acute Neurological Deficits--patient with subtle right-sided weakness - Patient generalized weakness, she is very unsteady and shaky =-MRI brain without contrast without acute intracranial abnormality - CTA head and neck without LVO or high-grade stenosis or aneurysmal dilatation or dissection involving the arteries of the head or neck - Patient with neck and lower back discomfort--get MRI of the C-spine and L-spine with and without contrast -Patient also with myalgias check total CK check CRP and ESR -TSH and free T4 WNL - PT OT eval appreciated recommends CIR - Per CIR patient not a candidate for them TOC will try to look for SNF rehab  2)Possible UTI--- IV Rocephin  pending culture data  3)s/p  post gastric bypass--with concerns for malabsorption and vitamin deficiencies - Ferritin is not low, serum iron is not low but TIBC is low - B12 is low normal, folate is low, vitamin D  is low - Vitamin D , vitamin B vitamin K and copper  are pending - Magnesium  low at 1.5,   4) chronic anemia--in the setting of prior gastric bypass - Please see #3 above - No acute bleeding concerns - Hemoglobin currently 10.8 - Give B12, folate, multivitamin and iron  5)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  6) alcohol abuse--- patient with DT symptoms  -- Continue thiamine , multivitamin and folic acid  as ordered - Lorazepam  per CIWA protocol  7) hypomagnesemia--in the setting of alcohol abuse, replaced  8) alcoholic transaminitis/fatty liver-- AST >>> ALT, elevated T. 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 Bulky lumbar facet arthropathy elsewhere and  Up to mild spinal and moderate biforaminal stenosis results at L4-L5 - MRI of the C-spine without acute findings patient does have-Chronic disc and endplate degeneration, maximal at C5-C6, and primarily affecting the neural foramina. No cervical spinal stenosis. Moderate to severe Left side C5 and C6 neural foraminal stenosis, -- Gabapentin  300 3 times daily as prescribed - Physical therapy eval appreciated  Status is: Inpatient   Disposition: The patient is from: Home              Anticipated d/c is to: SNF              Anticipated d/c date is: 2 days              Patient currently is not medically stable to d/c. Barriers: Not Clinically Stable-   Code Status :  -  Code Status: Full Code   Family Communication:    NA (patient is alert, awake and coherent)   DVT Prophylaxis  :   - SCDs   heparin  injection 5,000 Units Start: 03/28/24 2230 SCDs Start: 03/28/24 2134   Lab Results  Component Value Date  PLT 309 03/28/2024    Inpatient Medications  Scheduled Meds:  vitamin B-12  1,000 mcg Oral Daily   diazepam   2 mg Oral TID   diazepam   5 mg Oral Once   Fe Fum-Vit C-Vit B12-FA  1 capsule Oral QPC breakfast   ferrous sulfate   325 mg Oral Q breakfast   folic acid   1 mg Oral Daily   gabapentin   300 mg Oral TID   heparin   5,000 Units Subcutaneous Q8H   magnesium  oxide  400 mg Oral BID   multivitamin with minerals  1 tablet Oral Daily   thiamine  (VITAMIN B1) injection  100 mg  Intravenous Daily   [START ON 04/01/2024] thiamine   100 mg Oral Daily   Continuous Infusions:  cefTRIAXone  (ROCEPHIN )  IV 1 g (03/30/24 1046)   PRN Meds:.acetaminophen  **OR** acetaminophen , LORazepam  **OR** LORazepam , ondansetron  **OR** ondansetron  (ZOFRAN ) IV, oxyCODONE    Anti-infectives (From admission, onward)    Start     Dose/Rate Route Frequency Ordered Stop   03/29/24 1015  cefTRIAXone  (ROCEPHIN ) 1 g in sodium chloride  0.9 % 100 mL IVPB        1 g 200 mL/hr over 30 Minutes Intravenous Every 24 hours 03/29/24 0926     03/28/24 1515  cephALEXin  (KEFLEX ) capsule 500 mg        500 mg Oral  Once 03/28/24 1513 03/28/24 1549        Subjective: Stacy [name] today has no fevers, no emesis,  No chest pain,   - -Tremors - Unsteady, generalized weakness - Plaints of back pain and numbness in paresthesia  Objective: Vitals:   03/30/24 0933 03/30/24 1214 03/30/24 1234 03/30/24 1808  BP: 110/80 112/86 115/79 117/76  Pulse: 97 (!) 125 (!) 125 (!) 115  Resp:  14  20  Temp:  98 F (36.7 C)  98.4 F (36.9 C)  TempSrc:  Oral  Oral  SpO2:  98%  95%  Weight:      Height:        Intake/Output Summary (Last 24 hours) at 03/30/2024 1934 Last data filed at 03/30/2024 1412 Gross per 24 hour  Intake 360 ml  Output --  Net 360 ml   Filed Weights   03/28/24 1237 03/28/24 2035  Weight: 91.6 kg 87.9 kg    Physical Exam  Gen:- Awake Alert, no acute distress HEENT:- [place].AT,  +ve sclera icterus, edentulous Neck-Supple Neck,No JVD,.  Lungs-  CTAB , fair symmetrical air movement CV- S1, S2 normal, regular  Abd-  +ve B.Sounds, Abd Soft, No tenderness,    Extremity/Skin:- No  edema, pedal pulses present  Psych-affect is appropriate, oriented x3 Neuro-generalized weakness, subtle right-sided weakness, , very unsteady and shaky, +ve Tremors  Data Reviewed: I have personally reviewed following labs and imaging studies  CBC: Recent Labs  Lab 03/28/24 1244  WBC 4.8  HGB 10.8*   HCT 31.8*  MCV 110.0*  PLT 309   Basic Metabolic Panel: Recent Labs  Lab 03/28/24 1244 03/28/24 1428 03/30/24 0447  NA 135  --  135  K 3.6  --  3.4*  CL 99  --  102  CO2 22  --  25  GLUCOSE 102*  --  89  BUN 7  --  5*  CREATININE 0.54  --  0.48  CALCIUM 9.0  --  8.7*  MG  --  1.5* 1.7  PHOS  --   --  3.8   GFR: Estimated Creatinine Clearance: 93.3 mL/min (by C-G formula based on  SCr of 0.48 mg/dL). Liver Function Tests: Recent Labs  Lab 03/28/24 1244 03/30/24 0447  AST 67* 56*  ALT 40 30  ALKPHOS 80 71  BILITOT 2.7* 1.0  PROT 7.1 5.9*  ALBUMIN 3.5 2.9*   Radiology Studies: MR LUMBAR SPINE W WO CONTRAST Result Date: 03/30/2024 CLINICAL DATA:  43 year old female with neurologic deficit, weakness, neck and back pain. EXAM: MRI LUMBAR SPINE WITHOUT AND WITH CONTRAST TECHNIQUE: Multiplanar and multiecho pulse sequences of the lumbar spine were obtained without and with intravenous contrast. CONTRAST:  9mL GADAVIST  GADOBUTROL  1 MMOL/ML IV SOLN COMPARISON:  Abdominal radiographs 01/06/2019. FINDINGS: Segmentation:  Normal on the comparison radiographs. Alignment: Mild straightening of lumbar lordosis. No significant scoliosis or spondylolisthesis. Vertebrae: Chronic degenerative endplate spurring and marrow signal changes at L4-L5. Normal background bone marrow signal. Maintained vertebral height. Intact visible sacrum and SI joints. No marrow edema or evidence of acute osseous abnormality. Conus medullaris and cauda equina: Conus extends to the T12-L1 level. No lower spinal cord or conus signal abnormality. No abnormal intradural enhancement. No dural thickening. Paraspinal and other soft tissues: Negative. Disc levels: T11-T12 and T12-L1:  Negative disc.  Mild facet hypertrophy.  No stenosis. L1-L2: Negative disc. Mild to moderate facet hypertrophy greater on the right (series 22, image 10). No stenosis. L2-L3: Mild far lateral disc bulging. Moderate facet and ligament flavum  hypertrophy. No significant stenosis. L3-L4: Mild far lateral disc bulging. Moderate ligament flavum and moderate to severe bilateral facet hypertrophy (series 22, image 23). No significant stenosis. L4-L5: Pronounced disc space loss and circumferential disc osteophyte complex. Less pronounced mild facet hypertrophy at this level. Only mild spinal stenosis, no convincing lateral recess stenosis. Moderate left and mild to moderate right L4 neural foraminal stenosis due to broad-based foraminal disc osteophyte complex. L5-S1: Epidural lipomatosis effaces CSF from the thecal sac at this level (series 27, image 32). Negative disc. Mild facet hypertrophy. No stenosis. IMPRESSION: 1. 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 Bulky lumbar facet arthropathy elsewhere. 2. Up to mild spinal and moderate biforaminal stenosis results at L4-L5. No other convincing lumbar stenosis or neural impingement. 3. L5-S1 epidural lipomatosis. Electronically Signed   By: VEAR Hurst M.D.   On: 03/30/2024 13:29   MR CERVICAL SPINE W WO CONTRAST Result Date: 03/30/2024 CLINICAL DATA:  43 year old female with neurologic deficit, weakness, neck and back pain. EXAM: MRI CERVICAL SPINE WITHOUT AND WITH CONTRAST TECHNIQUE: Multiplanar and multiecho pulse sequences of the cervical spine, to include the craniocervical junction and cervicothoracic junction, were obtained without and with intravenous contrast. CONTRAST:  9mL GADAVIST  GADOBUTROL  1 MMOL/ML IV SOLN COMPARISON:  Brain MRI on 03/28/2024. FINDINGS: Alignment: Straightening of cervical lordosis. No significant scoliosis. No spondylolisthesis. Vertebrae: Maintained vertebral body height and normal background bone marrow signal. Chronic degenerative endplate spurring and degenerative endplate marrow signal changes at C5-C6. No marrow edema or evidence of acute osseous abnormality. Cord: Normal. No abnormal intradural enhancement or dural thickening. Posterior Fossa,  vertebral arteries, paraspinal tissues: Cervicomedullary junction is within normal limits. Negative visible posterior fossa. Preserved major vascular flow voids in the bilateral neck. Negative visible neck soft tissues, lung apices. Disc levels: C2-C3:  Negative. C3-C4: Mild foraminal disc bulging and endplate spurring. No spinal stenosis. Mild left C4 foraminal stenosis. C4-C5: Similar foraminal disc bulging and endplate spurring, asymmetrically greater on the left. Minimal broad-based posterior component of disc. No spinal stenosis. Moderate to severe left and mild right C5 neural foraminal stenosis. C5-C6: Disc space loss. Circumferential but  mostly foraminal and anterior disc osteophyte complex and asymmetric to the left. No spinal stenosis. Moderate to severe left and mild-to-moderate right C6 foraminal stenosis. C6-C7: Mild foraminal disc bulging and endplate spurring mostly on the left. No spinal stenosis. Mild left C7 foraminal stenosis. C7-T1: Mild foraminal endplate spurring, mild facet hypertrophy. No stenosis. No visible upper thoracic stenosis. IMPRESSION: 1. No acute or inflammatory process identified in the Cervical spine. Normal cervical spinal cord. 2. Chronic disc and endplate degeneration, maximal at C5-C6, and primarily affecting the neural foramina. No cervical spinal stenosis. Moderate to severe Left side C5 and C6 neural foraminal stenosis, Electronically Signed   By: VEAR Hurst M.D.   On: 03/30/2024 13:23   Scheduled Meds:  vitamin B-12  1,000 mcg Oral Daily   diazepam   2 mg Oral TID   diazepam   5 mg Oral Once   Fe Fum-Vit C-Vit B12-FA  1 capsule Oral QPC breakfast   ferrous sulfate   325 mg Oral Q breakfast   folic acid   1 mg Oral Daily   gabapentin   300 mg Oral TID   heparin   5,000 Units Subcutaneous Q8H   magnesium  oxide  400 mg Oral BID   multivitamin with minerals  1 tablet Oral Daily   thiamine  (VITAMIN B1) injection  100 mg Intravenous Daily   [START ON 04/01/2024] thiamine    100 mg Oral Daily   Continuous Infusions:  cefTRIAXone  (ROCEPHIN )  IV 1 g (03/30/24 1046)    LOS: 2 days   Rendall Carwin M.D on 03/30/2024 at 7:34 PM  Go to [email or web address] - for contact info  [facility] - Office  [phone]  If 7PM-7AM, please contact night-coverage [email or web address] 03/30/2024, 7:34 PM

## 2024-03-30 NOTE — Progress Notes (Signed)
 OT Cancellation Note  Patient Details Name: Stacy [name] MRN: [id number] DOB: 18-Jul-1981   Cancelled Treatment:    Reason Eval/Treat Not Completed: Patient at procedure or test/ unavailable. Pt not in the room at time of attempted treatment. Will attempt later as time allows.   Heraclio Seidman OT, MOT   Jayson Person 03/30/2024, 9:57 AM

## 2024-03-30 NOTE — Progress Notes (Signed)
 Physical Therapy Treatment Patient Details Name: Stacy [name] MRN: [id number] DOB: October 27, 1980 Today's Date: 03/30/2024   History of Present Illness Stacy [name] is a 43 y.o. female with medical history significant of gastric bypass who presented with discomfort in all extremities as well as paresthesias and sensory changes.  Patient had a fall and gait changes.  She presented to the ER where she was found to be afebrile hemodynamically stable.  She endorses drinking 10-15 drinks per week.  Labs were obtained and was indication which showed urinalysis concerning for infection, AST 67, ALT 40, hemoglobin 10.8, MCV 110, UDS negative, TSH 3.4, B12 312, free T4 0.88, magnesium  1.5.    PT Comments  Patient presents seated at EOB and agreeable for therapy.  Patient demonstrates fair/good return for completing BLE ROM/strengthening exercises, slight improvement in standing balance during gait training without AD, but continues to have poor coordination in legs having to lean on wall for support and had to sit in w/c for resting due to c/o fatigue. Patient required use of RW for returning to room without loss of balance. Patient tolerated sitting up at bedside with family members present in room after therapy. Patient will benefit from continued skilled physical therapy in hospital and recommended venue below to increase strength, balance, endurance for safe ADLs and gait.      If plan is discharge home, recommend the following: A lot of help with walking and/or transfers;A little help with bathing/dressing/bathroom;Help with stairs or ramp for entrance;Assistance with cooking/housework;Assist for transportation   Can travel by private vehicle     Yes  Equipment Recommendations  Rolling walker (2 wheels)    Recommendations for Other Services       Precautions / Restrictions Precautions Precautions: Fall Recall of Precautions/Restrictions: Intact Restrictions Weight Bearing Restrictions  Per Provider Order: No     Mobility  Bed Mobility               General bed mobility comments: Patient presents eated at bedside    Transfers Overall transfer level: Needs assistance Equipment used: Rolling walker (2 wheels), None Transfers: Sit to/from Stand, Bed to chair/wheelchair/BSC Sit to Stand: Contact guard assist   Step pivot transfers: Min assist, Contact guard assist       General transfer comment: labored movement with fair/good return for completing sit to stands without AD    Ambulation/Gait Ambulation/Gait assistance: Min assist Gait Distance (Feet): 50 Feet Assistive device: Rolling walker (2 wheels), None, 1 person hand held assist Gait Pattern/deviations: Decreased step length - right, Decreased step length - left, Decreased stride length, Scissoring, Ataxic, Staggering right, Staggering left Gait velocity: decreased     General Gait Details: Patient demonstrates slightl improvement for ambulating without AD, but has to lean on wall occasionally due to staggering left/right, had to use RW for ambulating back to room due to fatigue and safety, followed with w/c for safety   Stairs             Wheelchair Mobility     Tilt Bed    Modified Rankin (Stroke Patients Only)       Balance Overall balance assessment: Needs assistance Sitting-balance support: Feet supported, No upper extremity supported Sitting balance-Leahy Scale: Good Sitting balance - Comments: seated at EOB   Standing balance support: During functional activity, No upper extremity supported Standing balance-Leahy Scale: Poor Standing balance comment: fair/poor without AD, fair using RW  Communication Communication Communication: No apparent difficulties  Cognition Arousal: Alert Behavior During Therapy: WFL for tasks assessed/performed   PT - Cognitive impairments: No apparent impairments                          Following commands: Intact      Cueing Cueing Techniques: Verbal cues, Tactile cues  Exercises General Exercises - Lower Extremity Long Arc Quad: Seated, AROM, Strengthening, Both, 10 reps Hip Flexion/Marching: Seated, AROM, Strengthening, Both, 10 reps Toe Raises: Seated, AROM, Strengthening, Both, 10 reps Heel Raises: Seated, AROM, Strengthening, Both, 10 reps    General Comments        Pertinent Vitals/Pain Pain Assessment Pain Assessment: No/denies pain    Home Living                          Prior Function            PT Goals (current goals can now be found in the care plan section) Acute Rehab PT Goals PT Goal Formulation: With patient Time For Goal Achievement: 04/12/24 Potential to Achieve Goals: Good Progress towards PT goals: Progressing toward goals    Frequency    Min 3X/week      PT Plan      Co-evaluation              AM-PAC PT 6 Clicks Mobility   Outcome Measure  Help needed turning from your back to your side while in a flat bed without using bedrails?: None Help needed moving from lying on your back to sitting on the side of a flat bed without using bedrails?: None Help needed moving to and from a bed to a chair (including a wheelchair)?: A Little Help needed standing up from a chair using your arms (e.g., wheelchair or bedside chair)?: A Little Help needed to walk in hospital room?: A Lot Help needed climbing 3-5 steps with a railing? : A Lot 6 Click Score: 18    End of Session Equipment Utilized During Treatment: Gait belt Activity Tolerance: Patient tolerated treatment well;Patient limited by fatigue Patient left: in bed;with call bell/phone within reach;with family/visitor present;Other (comment) (Patient left seated at EOB with family members present) Nurse Communication: Mobility status PT Visit Diagnosis: Unsteadiness on feet (R26.81);Other abnormalities of gait and mobility (R26.89);Muscle weakness (generalized)  (M62.81)     Time: 8954-8889 PT Time Calculation (min) (ACUTE ONLY): 25 min  Charges:    $Gait Training: 8-22 mins $Therapeutic Exercise: 8-22 mins PT General Charges $$ ACUTE PT VISIT: 1 Visit                     12:21 PM, 03/30/24 Lynwood Music, MPT Physical Therapist with [facility] [phone] office [phone] mobile phone

## 2024-03-30 NOTE — Progress Notes (Signed)
 Occupational Therapy Treatment Patient Details Name: Stacy [name] MRN: [id number] DOB: February 22, 1981 Today's Date: 03/30/2024   History of present illness Stacy [name] is a 43 y.o. female with medical history significant of gastric bypass who presented with discomfort in all extremities as well as paresthesias and sensory changes.  Patient had a fall and gait changes.  She presented to the ER where she was found to be afebrile hemodynamically stable.  She endorses drinking 10-15 drinks per week.  Labs were obtained and was indication which showed urinalysis concerning for infection, AST 67, ALT 40, hemoglobin 10.8, MCV 110, UDS negative, TSH 3.4, B12 312, free T4 0.88, magnesium  1.5.   OT comments  Pt agreeable to OT treatment. Pt able to engage in seated yellow theraband shoulder strengthening with intermittent tactile cuing and assist to keep L UE stable. Pt able to complete grooming and ambulation in the room with CGA without AD. Pt also transferred to and from the toilet with CGA and no AD. Pt L shoulder still presents weak as it was at evaluation. Mild improvements noted in standing balance. Pt will benefit from continued OT in the hospital and recommended venue below to increase strength, balance, and endurance for safe ADL's.         If plan is discharge home, recommend the following:  A little help with walking and/or transfers;A little help with bathing/dressing/bathroom;Assistance with cooking/housework;Assist for transportation;Help with stairs or ramp for entrance   Equipment Recommendations  None recommended by OT    Recommendations for Other Services Rehab consult    Precautions / Restrictions Precautions Precautions: Fall Recall of Precautions/Restrictions: Intact Restrictions Weight Bearing Restrictions Per Provider Order: No       Mobility Bed Mobility               General bed mobility comments: Pt seated at the EOB to start the session.     Transfers Overall transfer level: Needs assistance   Transfers: Sit to/from Stand Sit to Stand: Supervision, Contact guard assist           General transfer comment: Sit to stand from toilet and EOB without physical assist but CGA for safety.     Balance Overall balance assessment: Needs assistance Sitting-balance support: Feet supported, No upper extremity supported Sitting balance-Leahy Scale: Good Sitting balance - Comments: seated at EOB   Standing balance support: During functional activity, No upper extremity supported Standing balance-Leahy Scale: Poor Standing balance comment: fair/poor without AD                           ADL either performed or assessed with clinical judgement   ADL Overall ADL's : Needs assistance/impaired     Grooming: Wash/dry hands;Standing;Contact guard assist Grooming Details (indicate cue type and reason): Completed without AD while standing.                 Toilet Transfer: Electronics engineer Details (indicate cue type and reason): EOB to toilet and back without AD.         Functional mobility during ADLs: Contact guard assist General ADL Comments: Able to walk around the room with CGA and no AD.    Extremity/Trunk Assessment Upper Extremity Assessment Upper Extremity Assessment: LUE deficits/detail LUE Deficits / Details: 3+/5 shoudler flexion and abduction. WFL otherwise.  Communication Communication Communication: No apparent difficulties   Cognition Arousal: Alert Behavior During Therapy: WFL for tasks assessed/performed Cognition: No apparent impairments                               Following commands: Intact        Cueing   Cueing Techniques: Verbal cues, Tactile cues  Exercises Exercises: Other exercises Other Exercises Other Exercises: Pt completed x10 reps of the follow yellow theraband exercises: resisted  external rotaion/retraction; PNF diagonals top to bottom and bottom to top. Top down completed both R and L sides. Other diagonal completed only to L side only; resisted chest pull.                 Pertinent Vitals/ Pain       Pain Assessment Pain Assessment: 0-10 Pain Score: 10-Worst pain ever Pain Location: all over Pain Descriptors / Indicators: Numbness, Throbbing Pain Intervention(s): Limited activity within patient's tolerance, Monitored during session, Repositioned                                                          Frequency  Min 3X/week        Progress Toward Goals  OT Goals(current goals can now be found in the care plan section)  Progress towards OT goals: Progressing toward goals  Acute Rehab OT Goals Patient Stated Goal: improve function OT Goal Formulation: With patient Time For Goal Achievement: 04/12/24 Potential to Achieve Goals: Good ADL Goals Pt Will Perform Grooming: Independently Pt Will Transfer to Toilet: Independently Pt/caregiver will Perform Home Exercise Program: Increased strength;Left upper extremity;Independently  Plan                                      End of Session    OT Visit Diagnosis: Unsteadiness on feet (R26.81);Other abnormalities of gait and mobility (R26.89);Muscle weakness (generalized) (M62.81);Other symptoms and signs involving the nervous system (R29.898)   Activity Tolerance Patient tolerated treatment well   Patient Left in bed;with call bell/phone within reach   Nurse Communication          Time: 1420-1446 OT Time Calculation (min): 26 min  Charges: OT General Charges $OT Visit: 1 Visit OT Treatments $Self Care/Home Management : 8-22 mins $Therapeutic Exercise: 8-22 mins  Elide Stalzer OT, MOT  Jayson Person 03/30/2024, 3:40 PM

## 2024-03-30 NOTE — Plan of Care (Signed)

## 2024-03-30 NOTE — Patient Instructions (Signed)
 1) Strengthening: Chest Pull - Resisted   Hold Theraband in front of body with hands about shoulder width a part. Pull band a part and back together slowly. Repeat __10-15__ times. Complete ___1_ set(s) per session.. Repeat ___1_ session(s) per day.  http://orth.exer.us /926   Copyright  VHI. All rights reserved.   2) PNF Strengthening: Resisted   Standing with resistive band around each hand, bring right arm up and away, thumb back. Repeat _10-15___ times per set. Do __1__ sets per session. Do __1__ sessions per day.    3) Resisted External Rotation: in Neutral - Bilateral   Sit or stand, tubing in both hands, elbows at sides, bent to 90, forearms forward. Pinch shoulder blades together and rotate forearms out. Keep elbows at sides. Repeat _10-15___ times per set. Do __1__ sets per session. Do _1___ sessions per day.  http://orth.exer.us /966   Copyright  VHI. All rights reserved.   4) PNF Strengthening: Resisted   Standing, hold resistive band above head. Bring right arm down and out from side. Repeat _10-15___ times per set. Do __1__ sets per session. Do __1_-2_ sessions per day.  http://orth.exer.us /922   Copyright  VHI. All rights reserved.

## 2024-03-31 DIAGNOSIS — R269 Unspecified abnormalities of gait and mobility: Secondary | ICD-10-CM | POA: Diagnosis not present

## 2024-03-31 DIAGNOSIS — R531 Weakness: Secondary | ICD-10-CM | POA: Diagnosis not present

## 2024-03-31 DIAGNOSIS — F10931 Alcohol use, unspecified with withdrawal delirium: Secondary | ICD-10-CM | POA: Diagnosis present

## 2024-03-31 DIAGNOSIS — E538 Deficiency of other specified B group vitamins: Secondary | ICD-10-CM | POA: Diagnosis not present

## 2024-03-31 LAB — URINE CULTURE

## 2024-03-31 MED ORDER — DIAZEPAM 2 MG PO TABS
2.0000 mg | ORAL_TABLET | Freq: Four times a day (QID) | ORAL | Status: DC
  Administered 2024-03-31 – 2024-04-01 (×4): 2 mg via ORAL
  Filled 2024-03-31 (×4): qty 1

## 2024-03-31 NOTE — Plan of Care (Signed)
   Problem: Activity: Goal: Risk for activity intolerance will decrease Outcome: Progressing   Problem: Coping: Goal: Level of anxiety will decrease Outcome: Progressing

## 2024-03-31 NOTE — Progress Notes (Signed)
 PROGRESS NOTE  Stacy [name], is a 43 y.o. female, DOB - 1980-08-30, FMW:[id number]  Admit date - 03/28/2024   Admitting Physician Claryce Friel Pearlean, MD  Outpatient Primary MD for the patient is [facility], [facility]  LOS - 3  Chief Complaint  Patient presents with   Numbness      Brief Narrative:  43 y.o. female with medical history significant of gastric bypass who presented with discomfort in all extremities as well as paresthesias and sensory changes admitted with same concerns and also concerns about possible acute neurological deficits and possible UTI   -Assessment and Plan: 1)Acute Neurological Deficits--patient with subtle right-sided weakness - Patient generalized weakness, she is very unsteady and shaky =-MRI brain without contrast without acute intracranial abnormality - CTA head and neck without LVO or high-grade stenosis or aneurysmal dilatation or dissection involving the arteries of the head or neck - Patient with neck and lower back discomfort--get MRI of the C-spine and L-spine with and without contrast -Patient also with myalgias --ESR 41, total CK is only 14 , CRP is only 0.6  -TSH and free T4 WNL - PT OT eval appreciated recommends CIR - Per CIR patient not a candidate for them TOC will try to look for SNF rehab --Continues to be shaky with tremors- -ambulated with  mobility specialist and remains quite shaky - Getting Valium  around-the-clock and as needed Ativan   2)Possible UTI--- urine culture without growth, okay to discontinue IV Rocephin   3)s/p  post gastric bypass--with concerns for malabsorption and vitamin deficiencies - Ferritin is not low, serum iron is not low but TIBC is low - B12 is low normal, folate is low, vitamin D  is low - Vitamin D , vitamin B vitamin K and copper  are pending - Magnesium  low at 1.5,   4) chronic anemia--in the setting of prior gastric bypass - Please see #3 above - No acute bleeding concerns - Hemoglobin currently  10.8 - Give B12, folate, multivitamin and iron  5)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  6) alcohol abuse--- patient with DT symptoms  --Continues to be shaky with tremors- -ambulated with  mobility specialist and remains quite shaky - Getting Valium  around-the-clock and as needed Ativan  -- Continue thiamine , multivitamin and folic acid  as ordered - Lorazepam  per CIWA protocol  7) hypomagnesemia--in the setting of alcohol abuse, replaced  8) alcoholic transaminitis/fatty liver-- AST >>> ALT, elevated T. 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 Bulky lumbar facet arthropathy elsewhere and  Up to mild spinal and moderate biforaminal stenosis results at L4-L5 - MRI of the C-spine without acute findings patient does have-Chronic disc and endplate degeneration, maximal at C5-C6, and primarily affecting the neural foramina. No cervical spinal stenosis. Moderate to severe Left side C5 and C6 neural foraminal stenosis, -- Gabapentin  300 3 times daily as prescribed - Physical therapy eval appreciated  Status is: Inpatient   Disposition: The patient is from: Home              Anticipated d/c is to: SNF              Anticipated d/c date is: 2 days              Patient currently is not medically stable to d/c. Barriers: Not Clinically Stable-  Code Status :  -  Code Status: Full Code   Family Communication:    NA (patient is alert, awake and coherent)   DVT Prophylaxis  :   - SCDs   heparin  injection 5,000 Units Start: 03/28/24 2230 SCDs Start: 03/28/24 2134   Lab Results  Component Value Date   PLT 309 03/28/2024   Inpatient Medications  Scheduled Meds:  vitamin B-12  1,000 mcg Oral Daily   diazepam   2 mg Oral QID   Fe  Fum-Vit C-Vit B12-FA  1 capsule Oral QPC breakfast   ferrous sulfate   325 mg Oral Q breakfast   folic acid   1 mg Oral Daily   gabapentin   300 mg Oral TID   heparin   5,000 Units Subcutaneous Q8H   magnesium  oxide  400 mg Oral BID   multivitamin with minerals  1 tablet Oral Daily   thiamine  (VITAMIN B1) injection  100 mg Intravenous Daily   [START ON 04/01/2024] thiamine   100 mg Oral Daily   Continuous Infusions:  cefTRIAXone  (ROCEPHIN )  IV Stopped (03/31/24 0917)   PRN Meds:.acetaminophen  **OR** acetaminophen , LORazepam  **OR** LORazepam , ondansetron  **OR** ondansetron  (ZOFRAN ) IV, oxyCODONE , traZODone    Anti-infectives (From admission, onward)    Start     Dose/Rate Route Frequency Ordered Stop   03/29/24 1015  cefTRIAXone  (ROCEPHIN ) 1 g in sodium chloride  0.9 % 100 mL IVPB        1 g 200 mL/hr over 30 Minutes Intravenous Every 24 hours 03/29/24 0926     03/28/24 1515  cephALEXin  (KEFLEX ) capsule 500 mg        500 mg Oral  Once 03/28/24 1513 03/28/24 1549        Subjective: Stacy [name] today has no fevers, no emesis,  No chest pain,   -  -Continues to be shaky with tremors- -ambulated with  mobility specialist and remains quite shaky - Getting Valium  around-the-clock and as needed Ativan   Objective: Vitals:   03/31/24 0001 03/31/24 0407 03/31/24 1254 03/31/24 1757  BP: (!) 119/93 (!) 109/91 (!) 150/113 (!) 131/98  Pulse: (!) 108 (!) 108 (!) 105 (!) 123  Resp: 20 20 18 20   Temp: 98.4 F (36.9 C) 98.6 F (37 C) 98.1 F (36.7 C) 97.6 F (36.4 C)  TempSrc: Oral Oral Oral Oral  SpO2: 98% 99% 98% 100%  Weight:      Height:        Intake/Output Summary (Last 24 hours) at 03/31/2024 1826 Last data filed at 03/31/2024 1700 Gross per 24 hour  Intake 680 ml  Output --  Net 680 ml   Filed Weights   03/28/24 1237 03/28/24 2035  Weight: 91.6 kg 87.9 kg    Physical Exam  Gen:- Awake Alert, no acute distress HEENT:- [place].AT,  +ve sclera icterus,  edentulous Neck-Supple Neck,No JVD,.  Lungs-  CTAB , fair symmetrical air movement CV- S1, S2 normal, regular  Abd-  +ve B.Sounds, Abd Soft, No tenderness,    Extremity/Skin:- No  edema, pedal pulses present  Psych-affect is appropriate, oriented x3 Neuro-generalized weakness, subtle right-sided weakness, , very unsteady and shaky, +ve Tremors  Data Reviewed: I have personally reviewed following labs and imaging studies  CBC: Recent Labs  Lab 03/28/24 1244  WBC 4.8  HGB 10.8*  HCT 31.8*  MCV 110.0*  PLT 309   Basic Metabolic Panel: Recent Labs  Lab 03/28/24 1244 03/28/24 1428 03/30/24 0447  NA 135  --  135  K 3.6  --  3.4*  CL  99  --  102  CO2 22  --  25  GLUCOSE 102*  --  89  BUN 7  --  5*  CREATININE 0.54  --  0.48  CALCIUM 9.0  --  8.7*  MG  --  1.5* 1.7  PHOS  --   --  3.8   GFR: Estimated Creatinine Clearance: 93.3 mL/min (by C-G formula based on SCr of 0.48 mg/dL). Liver Function Tests: Recent Labs  Lab 03/28/24 1244 03/30/24 0447  AST 67* 56*  ALT 40 30  ALKPHOS 80 71  BILITOT 2.7* 1.0  PROT 7.1 5.9*  ALBUMIN 3.5 2.9*   Radiology Studies: MR LUMBAR SPINE W WO CONTRAST Result Date: 03/30/2024 CLINICAL DATA:  43 year old female with neurologic deficit, weakness, neck and back pain. EXAM: MRI LUMBAR SPINE WITHOUT AND WITH CONTRAST TECHNIQUE: Multiplanar and multiecho pulse sequences of the lumbar spine were obtained without and with intravenous contrast. CONTRAST:  9mL GADAVIST  GADOBUTROL  1 MMOL/ML IV SOLN COMPARISON:  Abdominal radiographs 01/06/2019. FINDINGS: Segmentation:  Normal on the comparison radiographs. Alignment: Mild straightening of lumbar lordosis. No significant scoliosis or spondylolisthesis. Vertebrae: Chronic degenerative endplate spurring and marrow signal changes at L4-L5. Normal background bone marrow signal. Maintained vertebral height. Intact visible sacrum and SI joints. No marrow edema or evidence of acute osseous abnormality. Conus  medullaris and cauda equina: Conus extends to the T12-L1 level. No lower spinal cord or conus signal abnormality. No abnormal intradural enhancement. No dural thickening. Paraspinal and other soft tissues: Negative. Disc levels: T11-T12 and T12-L1:  Negative disc.  Mild facet hypertrophy.  No stenosis. L1-L2: Negative disc. Mild to moderate facet hypertrophy greater on the right (series 22, image 10). No stenosis. L2-L3: Mild far lateral disc bulging. Moderate facet and ligament flavum hypertrophy. No significant stenosis. L3-L4: Mild far lateral disc bulging. Moderate ligament flavum and moderate to severe bilateral facet hypertrophy (series 22, image 23). No significant stenosis. L4-L5: Pronounced disc space loss and circumferential disc osteophyte complex. Less pronounced mild facet hypertrophy at this level. Only mild spinal stenosis, no convincing lateral recess stenosis. Moderate left and mild to moderate right L4 neural foraminal stenosis due to broad-based foraminal disc osteophyte complex. L5-S1: Epidural lipomatosis effaces CSF from the thecal sac at this level (series 27, image 32). Negative disc. Mild facet hypertrophy. No stenosis. IMPRESSION: 1. 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 Bulky lumbar facet arthropathy elsewhere. 2. Up to mild spinal and moderate biforaminal stenosis results at L4-L5. No other convincing lumbar stenosis or neural impingement. 3. L5-S1 epidural lipomatosis. Electronically Signed   By: VEAR Hurst M.D.   On: 03/30/2024 13:29   MR CERVICAL SPINE W WO CONTRAST Result Date: 03/30/2024 CLINICAL DATA:  43 year old female with neurologic deficit, weakness, neck and back pain. EXAM: MRI CERVICAL SPINE WITHOUT AND WITH CONTRAST TECHNIQUE: Multiplanar and multiecho pulse sequences of the cervical spine, to include the craniocervical junction and cervicothoracic junction, were obtained without and with intravenous contrast. CONTRAST:  9mL GADAVIST  GADOBUTROL   1 MMOL/ML IV SOLN COMPARISON:  Brain MRI on 03/28/2024. FINDINGS: Alignment: Straightening of cervical lordosis. No significant scoliosis. No spondylolisthesis. Vertebrae: Maintained vertebral body height and normal background bone marrow signal. Chronic degenerative endplate spurring and degenerative endplate marrow signal changes at C5-C6. No marrow edema or evidence of acute osseous abnormality. Cord: Normal. No abnormal intradural enhancement or dural thickening. Posterior Fossa, vertebral arteries, paraspinal tissues: Cervicomedullary junction is within normal limits. Negative visible posterior fossa. Preserved major vascular flow voids in the  bilateral neck. Negative visible neck soft tissues, lung apices. Disc levels: C2-C3:  Negative. C3-C4: Mild foraminal disc bulging and endplate spurring. No spinal stenosis. Mild left C4 foraminal stenosis. C4-C5: Similar foraminal disc bulging and endplate spurring, asymmetrically greater on the left. Minimal broad-based posterior component of disc. No spinal stenosis. Moderate to severe left and mild right C5 neural foraminal stenosis. C5-C6: Disc space loss. Circumferential but mostly foraminal and anterior disc osteophyte complex and asymmetric to the left. No spinal stenosis. Moderate to severe left and mild-to-moderate right C6 foraminal stenosis. C6-C7: Mild foraminal disc bulging and endplate spurring mostly on the left. No spinal stenosis. Mild left C7 foraminal stenosis. C7-T1: Mild foraminal endplate spurring, mild facet hypertrophy. No stenosis. No visible upper thoracic stenosis. IMPRESSION: 1. No acute or inflammatory process identified in the Cervical spine. Normal cervical spinal cord. 2. Chronic disc and endplate degeneration, maximal at C5-C6, and primarily affecting the neural foramina. No cervical spinal stenosis. Moderate to severe Left side C5 and C6 neural foraminal stenosis, Electronically Signed   By: VEAR Hurst M.D.   On: 03/30/2024 13:23    Scheduled Meds:  vitamin B-12  1,000 mcg Oral Daily   diazepam   2 mg Oral QID   Fe Fum-Vit C-Vit B12-FA  1 capsule Oral QPC breakfast   ferrous sulfate   325 mg Oral Q breakfast   folic acid   1 mg Oral Daily   gabapentin   300 mg Oral TID   heparin   5,000 Units Subcutaneous Q8H   magnesium  oxide  400 mg Oral BID   multivitamin with minerals  1 tablet Oral Daily   thiamine  (VITAMIN B1) injection  100 mg Intravenous Daily   [START ON 04/01/2024] thiamine   100 mg Oral Daily   Continuous Infusions:  cefTRIAXone  (ROCEPHIN )  IV Stopped (03/31/24 0917)    LOS: 3 days   Rendall Carwin M.D on 03/31/2024 at 6:26 PM  Go to [email or web address] - for contact info  [facility] - Office  [phone]  If 7PM-7AM, please contact night-coverage [email or web address] 03/31/2024, 6:26 PM

## 2024-03-31 NOTE — Plan of Care (Signed)

## 2024-03-31 NOTE — Progress Notes (Signed)
 Mobility Specialist Progress Note:    03/31/24 1020  Mobility  Activity Ambulated with assistance;Pivoted/transferred from bed to chair  Level of Assistance Minimal assist, patient does 75% or more  Assistive Device Front wheel walker  Distance Ambulated (ft) 50 ft  Range of Motion/Exercises Active;All extremities  Activity Response Tolerated well  Mobility Referral Yes  Mobility visit 1 Mobility  Mobility Specialist Start Time (ACUTE ONLY) [id number]  Mobility Specialist Stop Time (ACUTE ONLY) 1020  Mobility Specialist Time Calculation (min) (ACUTE ONLY) 22 min   Pt received in bed, agreeable to mobility. Required MinA to stand and ambulate with RW. Tolerated well, c/o feeling shaky and weak. Returned to room, left in chair. All needs met.   Sherrilee Ditty Mobility Specialist Please contact via Special educational needs teacher or  Rehab office at [phone]

## 2024-03-31 NOTE — TOC Progression Note (Addendum)
 Transition of Care [facility]) - Progression Note    Patient Details  Name: Stacy [name] MRN: [id number] Date of Birth: 08/17/1981  [facility] CM/SW Contact  Mcarthur Saddie Kim, [place] Phone Number: 03/31/2024, 8:07 AM  Clinical Narrative: LCSW discussed with pt that no bed offers have been received for [facility] or [facility]. Pt now plans to return home and go to outpatient PT/OT at [facility]. Referrals made. She also requests rollator. No preference on agency. Referred to Zach with [employer] to drop ship to pt's home. Requested MD put in order.  TOC received consult for substance abuse. Pt reports she drinks about 3 glasses of wine several times a week. She does not feel this is a problem for her, but indicates she had conversation with MD and plans to cut back. She states she can cut back on her own and is not interested in resources.     Expected Discharge Plan: Skilled Nursing Facility Barriers to Discharge: Continued Medical Work up               Expected Discharge Plan and Services In-house Referral: Clinical Social Work   Post Acute Care Choice: Skilled Nursing Facility Living arrangements for the past 2 months: Single Family Home                                       Social Drivers of Health (SDOH) Interventions SDOH Screenings   Food Insecurity: No Food Insecurity (03/28/2024)  Housing: Low Risk  (03/28/2024)  Transportation Needs: No Transportation Needs (03/28/2024)  Utilities: Not At Risk (03/28/2024)  Financial Resource Strain: Low Risk  (06/02/2019)  Physical Activity: Insufficiently Active (06/02/2019)  Social Connections: Unknown (01/01/2022)   Received from [facility]  Stress: No Stress Concern Present (06/02/2019)  Tobacco Use: High Risk (03/28/2024)    Readmission Risk Interventions     No data to display

## 2024-04-01 DIAGNOSIS — N3000 Acute cystitis without hematuria: Secondary | ICD-10-CM | POA: Diagnosis not present

## 2024-04-01 DIAGNOSIS — R270 Ataxia, unspecified: Secondary | ICD-10-CM | POA: Diagnosis not present

## 2024-04-01 DIAGNOSIS — R7989 Other specified abnormal findings of blood chemistry: Secondary | ICD-10-CM

## 2024-04-01 DIAGNOSIS — R29818 Other symptoms and signs involving the nervous system: Secondary | ICD-10-CM | POA: Diagnosis not present

## 2024-04-01 DIAGNOSIS — F10931 Alcohol use, unspecified with withdrawal delirium: Secondary | ICD-10-CM | POA: Diagnosis not present

## 2024-04-01 DIAGNOSIS — K760 Fatty (change of) liver, not elsewhere classified: Secondary | ICD-10-CM

## 2024-04-01 DIAGNOSIS — R531 Weakness: Secondary | ICD-10-CM | POA: Diagnosis not present

## 2024-04-01 DIAGNOSIS — E538 Deficiency of other specified B group vitamins: Secondary | ICD-10-CM | POA: Diagnosis not present

## 2024-04-01 DIAGNOSIS — F1010 Alcohol abuse, uncomplicated: Secondary | ICD-10-CM | POA: Diagnosis not present

## 2024-04-01 LAB — CBC
HCT: 27.3 % — ABNORMAL LOW (ref 36.0–46.0)
Hemoglobin: 9.1 g/dL — ABNORMAL LOW (ref 12.0–15.0)
MCH: 37.6 pg — ABNORMAL HIGH (ref 26.0–34.0)
MCHC: 33.3 g/dL (ref 30.0–36.0)
MCV: 112.8 fL — ABNORMAL HIGH (ref 80.0–100.0)
Platelets: 251 K/uL (ref 150–400)
RBC: 2.42 MIL/uL — ABNORMAL LOW (ref 3.87–5.11)
RDW: 16.8 % — ABNORMAL HIGH (ref 11.5–15.5)
WBC: 6.3 K/uL (ref 4.0–10.5)
nRBC: 0.3 % — ABNORMAL HIGH (ref 0.0–0.2)

## 2024-04-01 LAB — RENAL FUNCTION PANEL
Albumin: 2.6 g/dL — ABNORMAL LOW (ref 3.5–5.0)
Anion gap: 6 (ref 5–15)
BUN: 6 mg/dL (ref 6–20)
CO2: 25 mmol/L (ref 22–32)
Calcium: 8.4 mg/dL — ABNORMAL LOW (ref 8.9–10.3)
Chloride: 107 mmol/L (ref 98–111)
Creatinine, Ser: 0.46 mg/dL (ref 0.44–1.00)
GFR, Estimated: >60 mL/min (ref >60)
Glucose, Bld: 84 mg/dL (ref 70–99)
Phosphorus: 3.7 mg/dL (ref 2.5–4.6)
Potassium: 3.8 mmol/L (ref 3.5–5.1)
Sodium: 138 mmol/L (ref 135–145)

## 2024-04-01 LAB — VITAMIN A: Vitamin A (Retinoic Acid): 11.2 ug/dL — ABNORMAL LOW (ref 20.1–62.0)

## 2024-04-01 LAB — COPPER, SERUM: Copper: 108 ug/dL (ref 80–158)

## 2024-04-01 MED ORDER — FOLIC ACID 1 MG PO TABS: 1.0000 mg | ORAL_TABLET | Freq: Every day | ORAL | 5 refills | Status: AC

## 2024-04-01 MED ORDER — CYANOCOBALAMIN 1000 MCG PO TABS: 1000.0000 ug | ORAL_TABLET | Freq: Every day | ORAL | 5 refills | Status: DC

## 2024-04-01 MED ORDER — MAGNESIUM OXIDE -MG SUPPLEMENT 400 (240 MG) MG PO TABS: 400.0000 mg | ORAL_TABLET | Freq: Two times a day (BID) | ORAL | 0 refills | Status: AC

## 2024-04-01 MED ORDER — TRIGELS-F FORTE 460-60-0.01-1 MG PO CAPS: 1.0000 | ORAL_CAPSULE | Freq: Every day | ORAL | 5 refills | Status: DC

## 2024-04-01 MED ORDER — CHLORDIAZEPOXIDE HCL 5 MG PO CAPS: 5.0000 mg | ORAL_CAPSULE | ORAL | 0 refills | Status: DC

## 2024-04-01 MED ORDER — GABAPENTIN 300 MG PO CAPS: 300.0000 mg | ORAL_CAPSULE | Freq: Three times a day (TID) | ORAL | 2 refills | Status: DC

## 2024-04-01 MED ORDER — FERROUS SULFATE 325 (65 FE) MG PO TABS: 325.0000 mg | ORAL_TABLET | Freq: Every day | ORAL | 5 refills | Status: AC

## 2024-04-01 MED ORDER — CYANOCOBALAMIN 1000 MCG/ML IJ SOLN
1000.0000 ug | Freq: Once | INTRAMUSCULAR | Status: AC
  Administered 2024-04-01: 1000 ug via INTRAMUSCULAR
  Filled 2024-04-01: qty 1

## 2024-04-01 NOTE — Discharge Summary (Signed)
 Stacy [name], is a 43 y.o. female  DOB 09-Jul-1981  MRN [id number].  Admission date:  03/28/2024  Admitting Physician  Rendall Carwin, MD  Discharge Date:  04/01/2024   Primary MD  [facility]  Recommendations for primary care physician for things to follow:  1)Avoid ibuprofen /Advil /Aleve /Motrin [name]/Naproxen /BC powders/Meloxicam /Diclofenac/Indomethacin and other Nonsteroidal anti-inflammatory medications as these will make you more likely to bleed and can cause stomach ulcers, can also cause Kidney problems.   2) complete abstinence from alcohol advised--- please use Librium /chlordiazepoxide  as prescribed to help you quit drinking  3)Follow up with NeuroSurgeon... Dr [facility] for evaluation of your neck and back pain [address], [address], [place], [place] [address] [phone]  4) repeat CBC and CMP blood test around Monday, 04/05/2024 advised  Admission Diagnosis  Paresthesia [R20.2] Ataxia [R27.0] CVA (cerebrovascular accident) (HCC) [I63.9] Acute cystitis without hematuria [N30.00] DTs (delirium tremens) (HCC) [F10.931]   Discharge Diagnosis  Paresthesia [R20.2] Ataxia [R27.0] CVA (cerebrovascular accident) (HCC) [I63.9] Acute cystitis without hematuria [N30.00] DTs (delirium tremens) (HCC) [F10.931]    Active Problems:   Weakness   Gait abnormality   Folate deficiency   Alcohol abuse   Elevated LFTs   Hepatic steatosis   Vitamin D  deficiency   DTs (delirium tremens) (HCC)      Past Medical History:  Diagnosis Date   Neuropathy 01/09/2019   Pulmonary embolism (HCC)     Past Surgical History:  Procedure Laterality Date   GASTRIC BYPASS     TONSILLECTOMY       HPI  from the history and physical done on the day of admission:    Chief Complaint: Sensory changes and weakness.   HPI: Stacy [name]  is a 43 y.o. female with medical history significant of gastric bypass who presented with discomfort in all extremities as well as paresthesias and sensory changes.  Patient had a fall and gait changes.  She presented to the ER where she was found to be afebrile hemodynamically stable.  She endorses drinking 10-15 drinks per week.  Labs were obtained and was indication which showed urinalysis concerning for infection, AST 67, ALT 40, hemoglobin 10.8, MCV 110, UDS negative, TSH 3.4, B12 312, free T4 0.88, magnesium  1.5.      Review of Systems: Review of Systems   Hospital Course:     Brief Narrative:  43 y.o. female with medical history significant of gastric bypass who presented with discomfort in all extremities as well as paresthesias and sensory changes admitted with same concerns and also concerns about possible acute neurological deficits and possible UTI   -Assessment and Plan: 1)Acute Neurological Deficits--patient with subtle right-sided weakness - Patient generalized weakness, she is very unsteady and shaky =-MRI brain without contrast without acute intracranial abnormality - CTA head and neck without LVO or high-grade stenosis or aneurysmal dilatation or dissection involving the arteries of the head or neck - Patient with neck and lower back discomfort--get MRI of the C-spine and L-spine with and without contrast -Patient also  with myalgias --ESR 41, total CK is only 14 , CRP is only 0.6  -TSH and free T4 WNL - PT OT eval appreciated recommends CIR - Per CIR patient not a candidate for them TOC will try to look for SNF rehab --Continues to be shaky with tremors- -ambulated with  mobility specialist and remains quite shaky - Getting Valium  around-the-clock and as needed Ativan    2)Possible UTI--- urine culture without growth, okay to discontinue IV Rocephin    3)s/p  post gastric bypass--with concerns for malabsorption and vitamin deficiencies - Ferritin is not low, serum iron is  not low but TIBC is low - B12 is low normal, folate is low, vitamin D  is low - Vitamin D , vitamin B vitamin K and copper  are pending - Magnesium  low at 1.5,    4) chronic anemia--in the setting of prior gastric bypass - Please see #3 above - No acute bleeding concerns - Hemoglobin currently 10.8 - Give B12, folate, multivitamin and iron   5)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   6) alcohol abuse--- patient with DT symptoms  --Continues to be shaky with tremors- -ambulated with  mobility specialist and remains quite shaky - Getting Valium  around-the-clock and as needed Ativan  -- Continue thiamine , multivitamin and folic acid  as ordered - Lorazepam  per CIWA protocol   7) hypomagnesemia--in the setting of alcohol abuse, replaced   8) alcoholic transaminitis/fatty liver-- AST >>> ALT, elevated T. 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 Bulky lumbar facet arthropathy elsewhere and  Up to mild spinal and moderate biforaminal stenosis results at L4-L5 - MRI of the C-spine without acute findings patient does have-Chronic disc and endplate degeneration, maximal at C5-C6, and primarily affecting the neural foramina. No cervical spinal stenosis. Moderate to severe Left side C5 and C6 neural foraminal stenosis, -- Gabapentin  300 3 times daily as prescribed - Physical therapy eval appreciated   Disposition: The patient is from: Home              Anticipated d/c is to: SNF     Discharge Condition: ***  Follow UP   Follow-up Information     Joshua Alm Hamilton, MD Follow up in 1 month(s).   Specialty: Neurosurgery Contact information: [address] [place] [address] [phone]                   Consults obtained - ***  Diet and Activity recommendation:  As advised  Discharge Instructions    **** Discharge Instructions     Ambulatory referral to Occupational Therapy   Complete by: As directed    Ambulatory referral to Physical Therapy   Complete by: As directed    Call MD for:  difficulty breathing, headache or visual disturbances   Complete by: As directed    Call MD for:  persistant dizziness or light-headedness   Complete by: As directed    Call MD for:  persistant nausea and vomiting   Complete by: As directed    Call MD for:  temperature >100.4   Complete by: As directed    Diet - low sodium heart healthy   Complete by: As directed    Discharge instructions   Complete by: As directed    1)Avoid ibuprofen /Advil /Aleve /Motrin [name]/Naproxen /BC powders/Meloxicam /Diclofenac/Indomethacin and  other Nonsteroidal anti-inflammatory medications as these will make you more likely to bleed and can cause stomach ulcers, can also cause Kidney problems.   2) complete abstinence from alcohol advised--- please use Librium /chlordiazepoxide  as prescribed to help you quit drinking  3)Follow up with NeuroSurgeon... Dr [facility] for evaluation of your neck and back pain [address], [place], [place] [address] [phone]  4) repeat CBC and CMP blood test around Monday, 04/05/2024 advised   Increase activity slowly   Complete by: As directed          Discharge Medications     Allergies as of 04/01/2024       Reactions   Ibuprofen  Other (See Comments)   Gastric bypass surgery and history of GI bleed.   Lovenox   [enoxaparin  Sodium] Hives        Medication List     STOP taking these medications    ibuprofen  200 MG tablet Commonly known as: ADVIL        TAKE these medications    acetaminophen  500 MG tablet Commonly known as: TYLENOL  Take 1,000 mg by mouth every 6 (six) hours as needed for  mild pain (pain score 1-3).   chlordiazePOXIDE  5 MG capsule Commonly known as: LIBRIUM  Take 1 capsule (5 mg total) by mouth See admin instructions. Take Librium  (CHLORDIAZEPOXIDE  HCL CAPS) Take 1 Capsule 3 times a day for 2 days, then take 1 cap 2 times a day for 2 days, then 1 cap daily x 2 days , then STOP----this will Help You Quit Drinking Alcohol.... - Please do Not drink alcohol while taking this medication   cyanocobalamin  1000 MCG tablet Take 1 tablet (1,000 mcg total) by mouth daily. Start taking on: April 02, 2024   ferrous sulfate  325 (65 FE) MG tablet Take 1 tablet (325 mg total) by mouth daily with breakfast. Start taking on: April 02, 2024   folic acid  1 MG tablet Commonly known as: FOLVITE  Take 1 tablet (1 mg total) by mouth daily. Start taking on: April 02, 2024   gabapentin  300 MG capsule Commonly known as: Neurontin  Take 1 capsule (300 mg total) by mouth 3 (three) times daily.   magnesium  oxide 400 (240 Mg) MG tablet Commonly known as: MAG-OX Take 1 tablet (400 mg total) by mouth 2 (two) times daily for 5 days.   Trigels-F Forte Caps capsule Generic drug: Fe Fum-Vit C-Vit B12-FA Take 1 capsule by mouth daily after breakfast. Start taking on: April 02, 2024               Durable Medical Equipment  (From admission, onward)           Start     Ordered   03/31/24 0856  For home use only DME 4 wheeled rolling walker with seat  Once       Question:  Patient needs a walker to treat with the following condition  Answer:  Unsteady gait   03/31/24 0855            Major procedures and Radiology Reports - PLEASE review detailed and final reports for all details, in brief -   ***  MR LUMBAR SPINE W WO CONTRAST Result Date: 03/30/2024 CLINICAL DATA:  43 year old female with neurologic deficit, weakness, neck and back pain. EXAM: MRI LUMBAR SPINE WITHOUT AND WITH CONTRAST TECHNIQUE: Multiplanar and multiecho pulse sequences of the lumbar spine  were obtained without and with intravenous contrast. CONTRAST:  9mL GADAVIST  GADOBUTROL  1  MMOL/ML IV SOLN COMPARISON:  Abdominal radiographs 01/06/2019. FINDINGS: Segmentation:  Normal on the comparison radiographs. Alignment: Mild straightening of lumbar lordosis. No significant scoliosis or spondylolisthesis. Vertebrae: Chronic degenerative endplate spurring and marrow signal changes at L4-L5. Normal background bone marrow signal. Maintained vertebral height. Intact visible sacrum and SI joints. No marrow edema or evidence of acute osseous abnormality. Conus medullaris and cauda equina: Conus extends to the T12-L1 level. No lower spinal cord or conus signal abnormality. No abnormal intradural enhancement. No dural thickening. Paraspinal and other soft tissues: Negative. Disc levels: T11-T12 and T12-L1:  Negative disc.  Mild facet hypertrophy.  No stenosis. L1-L2: Negative disc. Mild to moderate facet hypertrophy greater on the right (series 22, image 10). No stenosis. L2-L3: Mild far lateral disc bulging. Moderate facet and ligament flavum hypertrophy. No significant stenosis. L3-L4: Mild far lateral disc bulging. Moderate ligament flavum and moderate to severe bilateral facet hypertrophy (series 22, image 23). No significant stenosis. L4-L5: Pronounced disc space loss and circumferential disc osteophyte complex. Less pronounced mild facet hypertrophy at this level. Only mild spinal stenosis, no convincing lateral recess stenosis. Moderate left and mild to moderate right L4 neural foraminal stenosis due to broad-based foraminal disc osteophyte complex. L5-S1: Epidural lipomatosis effaces CSF from the thecal sac at this level (series 27, image 32). Negative disc. Mild facet hypertrophy. No stenosis. IMPRESSION: 1. 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 Bulky lumbar facet arthropathy elsewhere. 2. Up to mild spinal and moderate biforaminal stenosis results at L4-L5. No other convincing  lumbar stenosis or neural impingement. 3. L5-S1 epidural lipomatosis. Electronically Signed   By: VEAR Hurst M.D.   On: 03/30/2024 13:29   MR CERVICAL SPINE W WO CONTRAST Result Date: 03/30/2024 CLINICAL DATA:  43 year old female with neurologic deficit, weakness, neck and back pain. EXAM: MRI CERVICAL SPINE WITHOUT AND WITH CONTRAST TECHNIQUE: Multiplanar and multiecho pulse sequences of the cervical spine, to include the craniocervical junction and cervicothoracic junction, were obtained without and with intravenous contrast. CONTRAST:  9mL GADAVIST  GADOBUTROL  1 MMOL/ML IV SOLN COMPARISON:  Brain MRI on 03/28/2024. FINDINGS: Alignment: Straightening of cervical lordosis. No significant scoliosis. No spondylolisthesis. Vertebrae: Maintained vertebral body height and normal background bone marrow signal. Chronic degenerative endplate spurring and degenerative endplate marrow signal changes at C5-C6. No marrow edema or evidence of acute osseous abnormality. Cord: Normal. No abnormal intradural enhancement or dural thickening. Posterior Fossa, vertebral arteries, paraspinal tissues: Cervicomedullary junction is within normal limits. Negative visible posterior fossa. Preserved major vascular flow voids in the bilateral neck. Negative visible neck soft tissues, lung apices. Disc levels: C2-C3:  Negative. C3-C4: Mild foraminal disc bulging and endplate spurring. No spinal stenosis. Mild left C4 foraminal stenosis. C4-C5: Similar foraminal disc bulging and endplate spurring, asymmetrically greater on the left. Minimal broad-based posterior component of disc. No spinal stenosis. Moderate to severe left and mild right C5 neural foraminal stenosis. C5-C6: Disc space loss. Circumferential but mostly foraminal and anterior disc osteophyte complex and asymmetric to the left. No spinal stenosis. Moderate to severe left and mild-to-moderate right C6 foraminal stenosis. C6-C7: Mild foraminal disc bulging and endplate spurring  mostly on the left. No spinal stenosis. Mild left C7 foraminal stenosis. C7-T1: Mild foraminal endplate spurring, mild facet hypertrophy. No stenosis. No visible upper thoracic stenosis. IMPRESSION: 1. No acute or inflammatory process identified in the Cervical spine. Normal cervical spinal cord. 2. Chronic disc and endplate degeneration, maximal at C5-C6, and primarily affecting the neural foramina. No cervical spinal stenosis. Moderate  to severe Left side C5 and C6 neural foraminal stenosis, Electronically Signed   By: VEAR Hurst M.D.   On: 03/30/2024 13:23   MR BRAIN WO CONTRAST Result Date: 03/28/2024 CLINICAL DATA:  Initial evaluation for acute neuro deficit, stroke suspected. EXAM: MRI HEAD WITHOUT CONTRAST TECHNIQUE: Multiplanar, multiecho pulse sequences of the brain and surrounding structures were obtained without intravenous contrast. COMPARISON:  CTs from earlier the same day. FINDINGS: Brain: Cerebral volume within normal limits for age. No focal parenchymal signal abnormality. No abnormal foci of restricted diffusion to suggest acute or subacute ischemia. Gray-white matter differentiation well maintained. No encephalomalacia to suggest chronic cortical infarction or other insult. No foci of susceptibility artifact indicative of acute or chronic intracranial blood products. No mass lesion, midline shift or mass effect. Ventricles normal in size and morphology without hydrocephalus. No extra-axial fluid collection. Pituitary gland and suprasellar region within normal limits. Vascular: Major intracranial vascular flow voids are well maintained. Skull and upper cervical spine: Craniocervical junction within normal limits. Decreased T1 signal intensity within the visualized bone marrow, nonspecific, but most commonly related to anemia, smoking or obesity. No scalp soft tissue abnormality. Sinuses/Orbits: Globes and orbital soft tissues are within normal limits. Paranasal sinuses are largely clear. No  significant mastoid effusion. Other: None. IMPRESSION: Normal brain MRI. No acute intracranial abnormality identified. Electronically Signed   By: Morene Hoard M.D.   On: 03/28/2024 19:39   CT Angio Head Neck W WO CM Result Date: 03/28/2024 EXAM: CTA Head and Neck with Intravenous Contrast. CT Head without Contrast. CLINICAL HISTORY: Neuro deficit, acute, stroke suspected; c/o numbness and tingling since Wednesday (5 days), progressively worsening, with weakness traveling down her body to both feet. Generalized body aches. Pt also states she has noticed difficulty controlling her saliva; she is holding a washcloth against her mouth while talking due to drool. Denies slurred speech, no facial droop, no visual changes. Pt reports gait instability. TECHNIQUE: Axial CTA images of the head and neck performed with intravenous contrast. MIP reconstructed images were created and reviewed. Axial computed tomography images of the head/brain performed without intravenous contrast. Note: Per PQRS, the description of internal carotid artery percent stenosis, including 0 percent or normal exam, is based on [place] American Symptomatic Carotid Endarterectomy Trial (NASCET) criteria. Dose reduction technique was used including one or more of the following: automated exposure control, adjustment of mA and kV according to patient size, and/or iterative reconstruction. CONTRAST: Without and with; 75mL (iohexol  (OMNIPAQUE ) 350 MG/ML injection 75 mL IOHEXOL  350 MG/ML SOLN) COMPARISON: Same day CT head. FINDINGS: CT HEAD: BRAIN: No acute intraparenchymal hemorrhage. No mass lesion. No CT evidence for acute territorial infarct. No midline shift or extra-axial collection. VENTRICLES: No hydrocephalus. ORBITS: The orbits are unremarkable. SINUSES AND MASTOIDS: Clear. CTA NECK: COMMON CAROTID ARTERIES: No significant stenosis. No dissection or occlusion. INTERNAL CAROTID ARTERIES: No stenosis by NASCET criteria. No dissection or  occlusion. VERTEBRAL ARTERIES: No significant stenosis. No dissection or occlusion. CTA HEAD: ANTERIOR CEREBRAL ARTERIES: No significant stenosis. No occlusion. No aneurysm. MIDDLE CEREBRAL ARTERIES: No significant stenosis. No occlusion. No aneurysm. POSTERIOR CEREBRAL ARTERIES: No significant stenosis. No occlusion. No aneurysm. BASILAR ARTERY: No significant stenosis. No occlusion. No aneurysm. SOFT TISSUES: No acute finding. No masses or lymphadenopathy. BONES: No acute osseous abnormality. Degenerative endplate osteophytes at C5-6. Edentulous maxilla and mandible. IMPRESSION: 1. No large vessel occlusion, high-grade stenosis, aneurysmal dilatation, or dissection involving the arteries of the head and neck. Electronically signed by: Donnice Mania MD 03/28/2024  07:03 PM EDT RP Workstation: [id number]   CT Head Wo Contrast Result Date: 03/28/2024 CLINICAL DATA:  Neuro deficit, acute, stroke suspected Numbness and tingling for 5 days. EXAM: CT HEAD WITHOUT CONTRAST TECHNIQUE: Contiguous axial images were obtained from the base of the skull through the vertex without intravenous contrast. RADIATION DOSE REDUCTION: This exam was performed according to the departmental dose-optimization program which includes automated exposure control, adjustment of the mA and/or kV according to patient size and/or use of iterative reconstruction technique. COMPARISON:  Head CT 08/31/2020 FINDINGS: Brain: No intracranial hemorrhage, mass effect, or midline shift. No hydrocephalus. The basilar cisterns are patent. No evidence of territorial infarct or acute ischemia. No extra-axial or intracranial fluid collection. Vascular: No hyperdense vessel or unexpected calcification. Skull: No fracture or focal lesion. Sinuses/Orbits: Minimal opacification of lower right mastoid air cells. The paranasal sinuses are clear. Other: None. IMPRESSION: No acute intracranial abnormality. Electronically Signed   By: Andrea Gasman M.D.   On:  03/28/2024 14:34    Micro Results   *** Recent Results (from the past 240 hours)  Urine Culture     Status: Abnormal   Collection Time: 03/29/24  4:23 PM   Specimen: Urine, Clean Catch  Result Value Ref Range Status   Specimen Description   Final    URINE, CLEAN CATCH Performed at [facility], [address], [place], [place] [address]    Special Requests   Final    NONE Performed at [facility], [address] [place], [place] [address]    Culture MULTIPLE SPECIES PRESENT, SUGGEST RECOLLECTION (A)  Final   Report Status 03/31/2024 FINAL  Final    Today   Subjective    Stacy [name] today has no ***          Patient has been seen and examined prior to discharge   Objective   Blood pressure 116/84, pulse (!) 101, temperature 98 F (36.7 C), temperature source Oral, resp. rate 20, height 5' 2 (1.575 m), weight 87.9 kg, SpO2 100%.   Intake/Output Summary (Last 24 hours) at 04/01/2024 1536 Last data filed at 03/31/2024 1700 Gross per 24 hour  Intake 240 ml  Output --  Net 240 ml    Exam Gen:- Awake Alert, no acute distress *** HEENT:- Stacy [name], No sclera icterus Neck-Supple Neck,No JVD,.  Lungs-  CTAB , good air movement bilaterally CV- S1, S2 normal, regular Abd-  +ve B.Sounds, Abd Soft, No tenderness,    Extremity/Skin:- No  edema,   good pulses Psych-affect is appropriate, oriented x3 Neuro-no new focal deficits, no tremors ***   Data Review   CBC w Diff:  Lab Results  Component Value Date   WBC 6.3 04/01/2024   HGB 9.1 (L) 04/01/2024   HGB 13.6 08/03/2021   HCT 27.3 (L) 04/01/2024   HCT 39.4 08/03/2021   PLT 251 04/01/2024   PLT 231 08/03/2021   LYMPHOPCT 29 08/31/2020   MONOPCT 8 08/31/2020   EOSPCT 1 08/31/2020   BASOPCT 0 08/31/2020    CMP:  Lab Results  Component Value Date   NA 138 04/01/2024   NA 140 08/03/2021   K 3.8 04/01/2024   CL 107 04/01/2024   CO2 25 04/01/2024   BUN 6 04/01/2024   BUN 11 08/03/2021   CREATININE 0.46  04/01/2024   PROT 5.9 (L) 03/30/2024   PROT 7.7 08/03/2021   ALBUMIN 2.6 (L) 04/01/2024   ALBUMIN 4.5 08/03/2021   BILITOT 1.0 03/30/2024   BILITOT 1.2 08/03/2021  ALKPHOS 71 03/30/2024   AST 56 (H) 03/30/2024   ALT 30 03/30/2024  .  Total Discharge time is about 33 minutes  Rendall Carwin M.D on 04/01/2024 at 3:36 PM  Go to [email or web address] -  for contact info  [facility] - Office  [phone]

## 2024-04-01 NOTE — Progress Notes (Signed)
 Mobility Specialist Progress Note:   04/01/24 1212  Mobility  Activity Ambulated with assistance;Pivoted/transferred from bed to chair  Level of Assistance Minimal assist, patient does 75% or more  Assistive Device Front wheel walker  Distance Ambulated (ft) 60 ft  Range of Motion/Exercises Active;All extremities  Activity Response Tolerated well  Mobility Referral Yes  Mobility visit 1 Mobility  Mobility Specialist Start Time (ACUTE ONLY) 1140  Mobility Specialist Stop Time (ACUTE ONLY) 1212  Mobility Specialist Time Calculation (min) (ACUTE ONLY) 32 min   Pt received in bed, agreeable to mobility. Required MinA to stand and ambulate with RW. Tolerated well, pt c/o dizziness and lightheadedness during ambulation. Symptoms revolved with a sitting rest break. Returned pt to room, see vital signs below. Left in chair, all needs met.   Pre Mobility: BP: 131/103. MAP: 113. Pulse: 97bpm. SpO2 99% on RA.  During Mobility: VSS. Pulse: 125bpm Post Mobility: BP: 138/113. MAP: 121. Pulse: 95-105bpm. SpO2 98% on RA.   Sherrilee Ditty Mobility Specialist Please contact via Special educational needs teacher or  Rehab office at [phone]

## 2024-04-01 NOTE — Progress Notes (Signed)
 Discharge instructions reviewed and questions answered. Patient's father providing transportation home.

## 2024-04-01 NOTE — Discharge Instructions (Signed)
 1)Avoid ibuprofen /Advil /Aleve /Motrin [name]/Naproxen /BC powders/Meloxicam /Diclofenac/Indomethacin and other Nonsteroidal anti-inflammatory medications as these will make you more likely to bleed and can cause stomach ulcers, can also cause Kidney problems.   2) complete abstinence from alcohol advised--- please use Librium /chlordiazepoxide  as prescribed to help you quit drinking  3)Follow up with NeuroSurgeon... Dr [facility] for evaluation of your neck and back pain [address], [address], [place], [place] [address] [phone]  4) repeat CBC and CMP blood test around Monday, 04/05/2024 advised

## 2024-04-01 NOTE — TOC Transition Note (Signed)
 Transition of Care [facility]) - Discharge Note   Patient Details  Name: Stacy [name] MRN: [id number] Date of Birth: 12-25-1980  Transition of [facility]) CM/SW Contact:  Hoy DELENA Bigness, LCSW Phone Number: 04/01/2024, 10:08 AM   Clinical Narrative:    Pt to return home and follow OPPT at [facility]. Rollator to be delivered to pt's home by [employer]. No further TOC needs identified. TOC signing off.    Final next level of care: OP Rehab Barriers to Discharge: Barriers Resolved   Patient Goals and CMS Choice Patient states their goals for this hospitalization and ongoing recovery are:: short term rehab CMS Medicare.gov Compare Post Acute Care list provided to:: Patient Choice offered to / list presented to : Patient [place] ownership interest in [facility].provided to:: Patient    Discharge Placement                       Discharge Plan and Services Additional resources added to the After Visit Summary for   In-house Referral: Clinical Social Work   Post Acute Care Choice: Skilled Nursing Facility          DME Arranged: N/A DME Agency: NA                  Social Drivers of Health (SDOH) Interventions SDOH Screenings   Food Insecurity: No Food Insecurity (03/28/2024)  Housing: Low Risk  (03/28/2024)  Transportation Needs: No Transportation Needs (03/28/2024)  Utilities: Not At Risk (03/28/2024)  Financial Resource Strain: Low Risk  (06/02/2019)  Physical Activity: Insufficiently Active (06/02/2019)  Social Connections: Unknown (01/01/2022)   Received from [facility]  Stress: No Stress Concern Present (06/02/2019)  Tobacco Use: High Risk (03/28/2024)     Readmission Risk Interventions    04/01/2024   10:07 AM  Readmission Risk Prevention Plan  Post Dischage Appt Complete  Medication Screening Complete  Transportation Screening Complete

## 2024-04-02 LAB — VITAMIN B1: Vitamin B1 (Thiamine): 61 nmol/L — ABNORMAL LOW (ref 66.5–200.0)

## 2024-04-04 LAB — VITAMIN K1, SERUM: VITAMIN K1: 0.13 ng/mL (ref 0.10–2.20)

## 2024-04-22 ENCOUNTER — Encounter (HOSPITAL_COMMUNITY): Payer: Self-pay

## 2024-04-22 ENCOUNTER — Other Ambulatory Visit: Payer: Self-pay

## 2024-04-22 ENCOUNTER — Emergency Department (HOSPITAL_COMMUNITY)
Admission: EM | Admit: 2024-04-22 | Discharge: 2024-04-22 | Disposition: A | Discharge status: Home / Self Care | Attending: Emergency Medicine | Admitting: Emergency Medicine

## 2024-04-22 DIAGNOSIS — R202 Paresthesia of skin: Secondary | ICD-10-CM | POA: Diagnosis not present

## 2024-04-22 DIAGNOSIS — R200 Anesthesia of skin: Secondary | ICD-10-CM | POA: Diagnosis present

## 2024-04-22 LAB — CBC WITH DIFFERENTIAL/PLATELET
Abs Immature Granulocytes: 0.01 K/uL (ref 0.00–0.07)
Basophils Absolute: 0 K/uL (ref 0.0–0.1)
Basophils Relative: 0 %
Eosinophils Absolute: 0 K/uL (ref 0.0–0.5)
Eosinophils Relative: 1 %
HCT: 37.9 % (ref 36.0–46.0)
Hemoglobin: 12.4 g/dL (ref 12.0–15.0)
Immature Granulocytes: 0 %
Lymphocytes Relative: 36 %
Lymphs Abs: 1.8 K/uL (ref 0.7–4.0)
MCH: 34 pg (ref 26.0–34.0)
MCHC: 32.7 g/dL (ref 30.0–36.0)
MCV: 103.8 fL — ABNORMAL HIGH (ref 80.0–100.0)
Monocytes Absolute: 0.4 K/uL (ref 0.1–1.0)
Monocytes Relative: 8 %
Neutro Abs: 2.8 K/uL (ref 1.7–7.7)
Neutrophils Relative %: 55 %
Platelets: 235 K/uL (ref 150–400)
RBC: 3.65 MIL/uL — ABNORMAL LOW (ref 3.87–5.11)
RDW: 16.4 % — ABNORMAL HIGH (ref 11.5–15.5)
WBC: 5.1 K/uL (ref 4.0–10.5)
nRBC: 0 % (ref 0.0–0.2)

## 2024-04-22 LAB — RAPID URINE DRUG SCREEN, HOSP PERFORMED
Amphetamines: NOT DETECTED
Barbiturates: NOT DETECTED
Benzodiazepines: POSITIVE — AB
Cocaine: NOT DETECTED
Opiates: NOT DETECTED
Tetrahydrocannabinol: NOT DETECTED

## 2024-04-22 LAB — COMPREHENSIVE METABOLIC PANEL WITH GFR
ALT: 30 U/L (ref 0–44)
AST: 60 U/L — ABNORMAL HIGH (ref 15–41)
Albumin: 3.5 g/dL (ref 3.5–5.0)
Alkaline Phosphatase: 103 U/L (ref 38–126)
Anion gap: 16 — ABNORMAL HIGH (ref 5–15)
BUN: 7 mg/dL (ref 6–20)
CO2: 24 mmol/L (ref 22–32)
Calcium: 9.2 mg/dL (ref 8.9–10.3)
Chloride: 96 mmol/L — ABNORMAL LOW (ref 98–111)
Creatinine, Ser: 0.55 mg/dL (ref 0.44–1.00)
GFR, Estimated: >60 mL/min (ref >60)
Glucose, Bld: 81 mg/dL (ref 70–99)
Potassium: 4 mmol/L (ref 3.5–5.1)
Sodium: 136 mmol/L (ref 135–145)
Total Bilirubin: 4.4 mg/dL — ABNORMAL HIGH (ref 0.0–1.2)
Total Protein: 7.2 g/dL (ref 6.5–8.1)

## 2024-04-22 LAB — ETHANOL: Alcohol, Ethyl (B): <15 mg/dL (ref <15)

## 2024-04-22 LAB — LIPASE, BLOOD: Lipase: 39 U/L (ref 11–51)

## 2024-04-22 LAB — MAGNESIUM: Magnesium: 1.8 mg/dL (ref 1.7–2.4)

## 2024-04-22 MED ORDER — SODIUM CHLORIDE 0.9 % IV BOLUS
1000.0000 mL | Freq: Once | INTRAVENOUS | Status: AC
  Administered 2024-04-22: 1000 mL via INTRAVENOUS

## 2024-04-22 MED ORDER — GABAPENTIN 300 MG PO CAPS
600.0000 mg | ORAL_CAPSULE | Freq: Once | ORAL | Status: AC
  Administered 2024-04-22: 600 mg via ORAL
  Filled 2024-04-22: qty 2

## 2024-04-22 MED ORDER — GABAPENTIN 300 MG PO CAPS: ORAL_CAPSULE | ORAL | 2 refills | Status: AC

## 2024-04-22 NOTE — ED Notes (Signed)
 Pt presents in Triage with her father who reports Pt doe shave episodes of impaired memory, unsteady gait, and since the Pt has been discharged, Pt has been drinking alcohol daily.

## 2024-04-22 NOTE — Discharge Instructions (Addendum)
 Your exam and lab tests today are reassuring with the exception of you having an elevated bilirubin.  We are not sure why  this test is elevated but you have had this abnormal finding in the past as well and is associated with possible liver problems.  I do recommend you follow-up with a gastroenterologist for further testing to ensure liver health.  Please call Dr. Eartha for this.  As discussed, I do recommend increasing your gabapentin  since your neuropathy was historically better controlled at higher doses.  I would like you to take 600 mg in the morning, 300 mg for your midday dose, then 600 mg for your evening dose.  Plan to see Dr. Hassani who can reassess and decide whether this is an appropriate dose or whether you need further adjustment.

## 2024-04-22 NOTE — ED Provider Notes (Signed)
 [place] EMERGENCY DEPARTMENT AT [facility] Provider Note   CSN: [id number] Arrival date & time: 04/22/24  1216     Patient presents with: Numbness   Stacy [name] is a 43 y.o. female with a history including chronic peripheral neuropathy, history of alcohol abuse although patient has had no alcohol intake x 2 weeks, history of PE, with vitamin D  and folic acid  deficiencies, hypomagnesia and uti noted during recent hospitalization, but was negative for CVA per CT and MRI imaging.  She presents today with complaint of persistent neuropathy in her bilateral legs, also describes facial numbness, similar to last presentation.  She describes aching pain and paresthesias which causes chronic gait abnormalities.  During her recent hospitalization she was actually treated for DTs after which her neurologic symptoms improved, however,  but did not resolve.  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 Patient was asked several times and she reconfirms she is not withdrawing from alcohol and has had no etoh since prior to her last admission.  She finished a course of Librium  and folic acid  which was prescribed at her discharge.  She does state she used to be on higher doses of gabapentin  which helped relieve her neuropathy much better than the current 300 mg 3 times a day.  She states she was taking 900 mg 3 times daily when she was living in [place], for unclear reasons her dosing was reduced at some point in time.  She denies fevers or chills, chest pain, shortness of breath nausea vomiting or abdominal pain.  She reports a fair appetite since her dc.   The history is provided by the patient.       Prior to Admission medications   Medication Sig Start Date End Date Taking? Authorizing Provider  ferrous sulfate  325 (65 FE) MG tablet Take 1 tablet (325 mg total) by mouth daily with  breakfast. 04/02/24  Yes Emokpae, Courage, MD  chlordiazePOXIDE  (LIBRIUM ) 5 MG capsule Take 1 capsule (5 mg total) by mouth See admin instructions. Take Librium  (CHLORDIAZEPOXIDE  HCL CAPS) Take 1 Capsule 3 times a day for 2 days, then take 1 cap 2 times a day for 2 days, then 1 cap daily x 2 days , then STOP----this will Help You Quit Drinking Alcohol.... - Please do Not drink alcohol while taking this medication Patient not taking: Reported on 04/22/2024 04/01/24   Pearlean Manus, MD  cyanocobalamin  1000 MCG tablet Take 1 tablet (1,000 mcg total) by mouth daily. Patient not taking: Reported on 04/22/2024 04/02/24   Pearlean Manus, MD  Fe Fum-Vit C-Vit B12-FA (TRIGELS-F FORTE) CAPS capsule Take 1 capsule by mouth daily after breakfast. Patient not taking: Reported on 04/22/2024 04/02/24   Pearlean Manus, MD  folic acid  (FOLVITE ) 1 MG tablet Take 1 tablet (1 mg total) by mouth daily. Patient not taking: Reported on 04/22/2024 04/02/24   Pearlean Manus, MD  gabapentin  (NEURONTIN ) 300 MG capsule Take 600 mg by mouth qam,  300 mg midday,  then 600 mg at bedtime 04/22/24   Kerrin Markman, PA-C    Allergies: Ibuprofen  and Lovenox   [enoxaparin  sodium]    Review of Systems  Constitutional:  Negative for chills and fever.  HENT:  Negative for congestion and sore throat.   Eyes: Negative.   Respiratory:  Negative for chest tightness and shortness of breath.   Cardiovascular:  Negative for chest pain.  Gastrointestinal:  Negative for abdominal pain, nausea and vomiting.  Genitourinary: Negative.   Musculoskeletal:  Negative for arthralgias, back pain, joint swelling and neck pain.  Skin: Negative.  Negative for rash and wound.  Neurological:  Positive for numbness. Negative for dizziness, tremors, speech difficulty, weakness, light-headedness and headaches.  Psychiatric/Behavioral: Negative.      Updated Vital Signs BP (!) 142/100   Pulse (!) 112   Temp 98.9 F (37.2 C) (Oral)   Resp 12   Ht 5' 2  (1.575 m)   Wt 87.9 kg   SpO2 97%   BMI 35.44 kg/m   Physical Exam Vitals and nursing note reviewed.  Constitutional:      Appearance: She is well-developed.  HENT:     Head: Normocephalic and atraumatic.  Eyes:     Extraocular Movements: Extraocular movements intact.     Conjunctiva/sclera: Conjunctivae normal.     Pupils: Pupils are equal, round, and reactive to light.  Cardiovascular:     Rate and Rhythm: Normal rate and regular rhythm.     Heart sounds: Normal heart sounds.  Pulmonary:     Effort: Pulmonary effort is normal.     Breath sounds: Normal breath sounds. No wheezing.  Abdominal:     General: Bowel sounds are normal.     Palpations: Abdomen is soft.     Tenderness: There is no abdominal tenderness.  Musculoskeletal:        General: No deformity or signs of injury. Normal range of motion.     Cervical back: Normal range of motion.  Skin:    General: Skin is warm and dry.  Neurological:     Mental Status: She is alert and oriented to person, place, and time.     Cranial Nerves: Cranial nerves 2-12 are intact.     Sensory: Sensory deficit present.     Motor: No tremor or abnormal muscle tone.     Coordination: Heel to [facility] Test normal. Rapid alternating movements normal.     Comments: Pt has facial sensation which is equal to fine touch, endorses facial tingling. Equal grip strength.  No pronator drift. Decreased sensation bilateral lower extremities stocking glove, knees down.  Pt can flex/ext hips, knees, ankles without deficit.     (all labs ordered are listed, but only abnormal results are displayed) Labs Reviewed  CBC WITH DIFFERENTIAL/PLATELET - Abnormal; Notable for the following components:      Result Value   RBC 3.65 (*)    MCV 103.8 (*)    RDW 16.4 (*)    All other components within normal limits  COMPREHENSIVE METABOLIC PANEL WITH GFR - Abnormal; Notable for the following components:   Chloride 96 (*)    AST 60 (*)    Total Bilirubin 4.4 (*)     Anion gap 16 (*)    All other components within normal limits  RAPID URINE DRUG SCREEN, HOSP PERFORMED - Abnormal; Notable for the following components:   Benzodiazepines POSITIVE (*)    All other components within normal limits  ETHANOL  MAGNESIUM   LIPASE, BLOOD    EKG: EKG Interpretation Date/Time:  Thursday April 22 2024 12:39:41 EDT Ventricular Rate:  115 PR Interval:  149 QRS Duration:  73 QT Interval:  350 QTC Calculation: 485 R Axis:   47  Text Interpretation: Sinus tachycardia Borderline T abnormalities, anterior leads Confirmed by Garrick Charleston [phone]) on 04/22/2024 12:52:12 PM  Radiology: No results found.   Procedures   Medications Ordered in the ED  sodium chloride  0.9 % bolus 1,000 mL (0 mLs Intravenous Stopped 04/22/24 1554)  gabapentin  (NEURONTIN ) capsule 600 mg (600 mg Oral Given 04/22/24 1804)                                    Medical Decision Making Pt presenting with persistent parasthesia including lower legs and facial numbness, similar to presentation at last admission.  Was treated for dt's,  pt denies any etoh x 2 weeks,  father at bedside concurs.  This is not dt's, etoh check today is negative.  Had low folic acid  which was corrected with oral supplement after admission.  Magnesium  today is normal range as are other electrolytes. She is not anemic today but macrocytosis is present - h/o gastric bypass,  may benefit from B12 injections as advised at her dc.   Sx and labs today are stable and with recent extensive work up including CT, MRI imaging, no indication for repeat imaging or admission at this time.  She was strongly encouraged close f/u with her pcp and other recs from her recent hospitalization.  Pt's gabapentin  was increased, she was given 600 mg here for her midday dose,  then advised to take 600 qam and at bedtime starting tomorrow, with midday dose remaining at 300mg .  Close f/u pcp to assess and address further  as needed.  Pt agrees and  understands plan.  Amount and/or Complexity of Data Reviewed Labs: ordered.    Details: Labs reviewed including CBC with differential c-Met, rapid drug screen EtOH level magnesium  level.  Abnormalities including an AST of 60, her bilirubin is also elevated today at 4.4, it was 2.7 at her hospitalization.  She will need follow-up care with gastroenterology and referral was placed for this.  Risk Prescription drug management. Decision regarding hospitalization. Risk Details: No indication for rehospitalization today.  Her gabapentin  has been increased, hopefully this will help her further with her paresthesias.  It was reiterated strongly that she needs to follow-up as recommended with her recent hospitalization including following up with Dr. Joshua given her MRI findings on her C-spine and L-spine.  She will need follow-up care with gastroenterology given her suspected liver insult and abnormal labs today.  She was also advised to follow-up with her primary MD within 1 week for recheck of her symptoms and for any gabapentin  adjustment as he deems appropriate.        Final diagnoses:  Paresthesia  Hyperbilirubinemia    ED Discharge Orders          Ordered    gabapentin  (NEURONTIN ) 300 MG capsule        04/22/24 1737               Timi Reeser, PA-C 04/24/24 1409    Garrick Charleston, MD 04/24/24 1651

## 2024-04-22 NOTE — ED Triage Notes (Signed)
 Pt arrived via POV from home c/o generalized numbness throughout her body, and weakness in her LUE. Pt reports symptoms similar to last month when she was admitted to the hospital.

## 2024-06-02 ENCOUNTER — Emergency Department (HOSPITAL_COMMUNITY)

## 2024-06-02 ENCOUNTER — Other Ambulatory Visit: Payer: Self-pay

## 2024-06-02 ENCOUNTER — Inpatient Hospital Stay (HOSPITAL_COMMUNITY)
Admission: EM | Admit: 2024-06-02 | Discharge: 2024-06-04 | DRG: 392 | Disposition: A | Discharge status: Home / Self Care | Attending: Internal Medicine | Admitting: Internal Medicine

## 2024-06-02 ENCOUNTER — Encounter (HOSPITAL_COMMUNITY): Payer: Self-pay

## 2024-06-02 DIAGNOSIS — E871 Hypo-osmolality and hyponatremia: Secondary | ICD-10-CM | POA: Diagnosis present

## 2024-06-02 DIAGNOSIS — B3789 Other sites of candidiasis: Secondary | ICD-10-CM | POA: Diagnosis present

## 2024-06-02 DIAGNOSIS — R21 Rash and other nonspecific skin eruption: Secondary | ICD-10-CM | POA: Diagnosis present

## 2024-06-02 DIAGNOSIS — K7010 Alcoholic hepatitis without ascites: Secondary | ICD-10-CM | POA: Diagnosis present

## 2024-06-02 DIAGNOSIS — Z833 Family history of diabetes mellitus: Secondary | ICD-10-CM

## 2024-06-02 DIAGNOSIS — D539 Nutritional anemia, unspecified: Secondary | ICD-10-CM | POA: Diagnosis present

## 2024-06-02 DIAGNOSIS — K760 Fatty (change of) liver, not elsewhere classified: Secondary | ICD-10-CM | POA: Diagnosis present

## 2024-06-02 DIAGNOSIS — Z86711 Personal history of pulmonary embolism: Secondary | ICD-10-CM

## 2024-06-02 DIAGNOSIS — L989 Disorder of the skin and subcutaneous tissue, unspecified: Secondary | ICD-10-CM | POA: Diagnosis present

## 2024-06-02 DIAGNOSIS — E878 Other disorders of electrolyte and fluid balance, not elsewhere classified: Secondary | ICD-10-CM | POA: Diagnosis present

## 2024-06-02 DIAGNOSIS — M48061 Spinal stenosis, lumbar region without neurogenic claudication: Secondary | ICD-10-CM | POA: Diagnosis present

## 2024-06-02 DIAGNOSIS — D6959 Other secondary thrombocytopenia: Secondary | ICD-10-CM | POA: Diagnosis present

## 2024-06-02 DIAGNOSIS — F1010 Alcohol abuse, uncomplicated: Secondary | ICD-10-CM | POA: Diagnosis present

## 2024-06-02 DIAGNOSIS — E66812 Obesity, class 2: Secondary | ICD-10-CM | POA: Diagnosis present

## 2024-06-02 DIAGNOSIS — B372 Candidiasis of skin and nail: Secondary | ICD-10-CM | POA: Diagnosis present

## 2024-06-02 DIAGNOSIS — E538 Deficiency of other specified B group vitamins: Secondary | ICD-10-CM | POA: Diagnosis present

## 2024-06-02 DIAGNOSIS — R7881 Bacteremia: Secondary | ICD-10-CM | POA: Diagnosis present

## 2024-06-02 DIAGNOSIS — E872 Acidosis, unspecified: Secondary | ICD-10-CM | POA: Diagnosis present

## 2024-06-02 DIAGNOSIS — Z79899 Other long term (current) drug therapy: Secondary | ICD-10-CM

## 2024-06-02 DIAGNOSIS — R1115 Cyclical vomiting syndrome unrelated to migraine: Secondary | ICD-10-CM | POA: Diagnosis present

## 2024-06-02 DIAGNOSIS — Z888 Allergy status to other drugs, medicaments and biological substances status: Secondary | ICD-10-CM

## 2024-06-02 DIAGNOSIS — Z9884 Bariatric surgery status: Secondary | ICD-10-CM

## 2024-06-02 DIAGNOSIS — M79643 Pain in unspecified hand: Secondary | ICD-10-CM | POA: Diagnosis not present

## 2024-06-02 DIAGNOSIS — F10230 Alcohol dependence with withdrawal, uncomplicated: Secondary | ICD-10-CM | POA: Diagnosis present

## 2024-06-02 DIAGNOSIS — K909 Intestinal malabsorption, unspecified: Secondary | ICD-10-CM | POA: Diagnosis present

## 2024-06-02 DIAGNOSIS — F17210 Nicotine dependence, cigarettes, uncomplicated: Secondary | ICD-10-CM | POA: Diagnosis present

## 2024-06-02 DIAGNOSIS — M4726 Other spondylosis with radiculopathy, lumbar region: Secondary | ICD-10-CM | POA: Diagnosis present

## 2024-06-02 DIAGNOSIS — R112 Nausea with vomiting, unspecified: Principal | ICD-10-CM | POA: Diagnosis present

## 2024-06-02 DIAGNOSIS — B370 Candidal stomatitis: Secondary | ICD-10-CM | POA: Diagnosis present

## 2024-06-02 DIAGNOSIS — Z886 Allergy status to analgesic agent status: Secondary | ICD-10-CM

## 2024-06-02 DIAGNOSIS — N499 Inflammatory disorder of unspecified male genital organ: Secondary | ICD-10-CM | POA: Diagnosis not present

## 2024-06-02 DIAGNOSIS — Z6835 Body mass index (BMI) 35.0-35.9, adult: Secondary | ICD-10-CM

## 2024-06-02 DIAGNOSIS — E860 Dehydration: Secondary | ICD-10-CM | POA: Diagnosis present

## 2024-06-02 DIAGNOSIS — Z7401 Bed confinement status: Secondary | ICD-10-CM | POA: Diagnosis not present

## 2024-06-02 LAB — CBC WITH DIFFERENTIAL/PLATELET
Abs Immature Granulocytes: 0.01 K/uL (ref 0.00–0.07)
Basophils Absolute: 0 K/uL (ref 0.0–0.1)
Basophils Relative: 0 %
Eosinophils Absolute: 0 K/uL (ref 0.0–0.5)
Eosinophils Relative: 0 %
HCT: 40.4 % (ref 36.0–46.0)
Hemoglobin: 13.4 g/dL (ref 12.0–15.0)
Immature Granulocytes: 0 %
Lymphocytes Relative: 17 %
Lymphs Abs: 0.8 K/uL (ref 0.7–4.0)
MCH: 30.8 pg (ref 26.0–34.0)
MCHC: 33.2 g/dL (ref 30.0–36.0)
MCV: 92.9 fL (ref 80.0–100.0)
Monocytes Absolute: 0.2 K/uL (ref 0.1–1.0)
Monocytes Relative: 3 %
Neutro Abs: 3.8 K/uL (ref 1.7–7.7)
Neutrophils Relative %: 80 %
Platelets: 118 K/uL — ABNORMAL LOW (ref 150–400)
RBC: 4.35 MIL/uL (ref 3.87–5.11)
RDW: 16.9 % — ABNORMAL HIGH (ref 11.5–15.5)
WBC: 4.8 K/uL (ref 4.0–10.5)
nRBC: 0 % (ref 0.0–0.2)

## 2024-06-02 LAB — PROTIME-INR
INR: 0.9 (ref 0.8–1.2)
Prothrombin Time: 12.9 s (ref 11.4–15.2)

## 2024-06-02 LAB — COMPREHENSIVE METABOLIC PANEL WITH GFR
ALT: 129 U/L — ABNORMAL HIGH (ref 0–44)
AST: 373 U/L — ABNORMAL HIGH (ref 15–41)
Albumin: 4.1 g/dL (ref 3.5–5.0)
Alkaline Phosphatase: 190 U/L — ABNORMAL HIGH (ref 38–126)
Anion gap: 16 — ABNORMAL HIGH (ref 5–15)
BUN: 8 mg/dL (ref 6–20)
CO2: 27 mmol/L (ref 22–32)
Calcium: 10.4 mg/dL — ABNORMAL HIGH (ref 8.9–10.3)
Chloride: 91 mmol/L — ABNORMAL LOW (ref 98–111)
Creatinine, Ser: 0.65 mg/dL (ref 0.44–1.00)
GFR, Estimated: >60 mL/min (ref >60)
Glucose, Bld: 96 mg/dL (ref 70–99)
Potassium: 4.1 mmol/L (ref 3.5–5.1)
Sodium: 133 mmol/L — ABNORMAL LOW (ref 135–145)
Total Bilirubin: 2.7 mg/dL — ABNORMAL HIGH (ref 0.0–1.2)
Total Protein: 8 g/dL (ref 6.5–8.1)

## 2024-06-02 LAB — URINALYSIS, W/ REFLEX TO CULTURE (INFECTION SUSPECTED)
Glucose, UA: NEGATIVE mg/dL
Ketones, ur: 20 mg/dL — AB
Leukocytes,Ua: NEGATIVE
Nitrite: NEGATIVE
Protein, ur: 30 mg/dL — AB
Specific Gravity, Urine: 1.025 (ref 1.005–1.030)
pH: 5 (ref 5.0–8.0)

## 2024-06-02 LAB — GROUP A STREP BY PCR: Group A Strep by PCR: NOT DETECTED

## 2024-06-02 LAB — LACTIC ACID, PLASMA
Lactic Acid, Venous: 2 mmol/L (ref 0.5–1.9)
Lactic Acid, Venous: 2.7 mmol/L (ref 0.5–1.9)

## 2024-06-02 LAB — URINE DRUG SCREEN
Amphetamines: NEGATIVE
Barbiturates: NEGATIVE
Benzodiazepines: POSITIVE — AB
Cocaine: NEGATIVE
Fentanyl: NEGATIVE
Methadone Scn, Ur: NEGATIVE
Opiates: NEGATIVE
Tetrahydrocannabinol: NEGATIVE

## 2024-06-02 LAB — POC URINE PREG, ED: Preg Test, Ur: NEGATIVE

## 2024-06-02 LAB — LIPASE, BLOOD: Lipase: 25 U/L (ref 11–51)

## 2024-06-02 LAB — ETHANOL: Alcohol, Ethyl (B): <15 mg/dL (ref <15)

## 2024-06-02 MED ORDER — BISACODYL 10 MG RE SUPP: 10.0000 mg | Freq: Every day | RECTAL | Status: DC | PRN

## 2024-06-02 MED ORDER — SODIUM CHLORIDE 0.9% FLUSH
3.0000 mL | Freq: Two times a day (BID) | INTRAVENOUS | Status: DC
  Administered 2024-06-02 – 2024-06-04 (×4): 3 mL via INTRAVENOUS

## 2024-06-02 MED ORDER — LORAZEPAM 1 MG PO TABS: 1.0000 mg | ORAL_TABLET | ORAL | Status: DC | PRN

## 2024-06-02 MED ORDER — PIPERACILLIN-TAZOBACTAM 3.375 G IVPB: 3.3750 g | Freq: Three times a day (TID) | INTRAVENOUS | Status: DC

## 2024-06-02 MED ORDER — ACETAMINOPHEN 325 MG PO TABS
650.0000 mg | ORAL_TABLET | Freq: Four times a day (QID) | ORAL | Status: DC | PRN
  Administered 2024-06-03 (×2): 650 mg via ORAL
  Filled 2024-06-02 (×2): qty 2

## 2024-06-02 MED ORDER — SODIUM CHLORIDE 0.9 % IV SOLN: INTRAVENOUS | Status: AC | PRN

## 2024-06-02 MED ORDER — TRAZODONE HCL 50 MG PO TABS
50.0000 mg | ORAL_TABLET | Freq: Every evening | ORAL | Status: DC | PRN
  Administered 2024-06-03: 50 mg via ORAL
  Filled 2024-06-02: qty 1

## 2024-06-02 MED ORDER — NYSTATIN 100000 UNIT/ML MT SUSP
5.0000 mL | Freq: Four times a day (QID) | OROMUCOSAL | Status: DC
  Administered 2024-06-02 – 2024-06-04 (×7): 500000 [IU] via ORAL
  Filled 2024-06-02 (×9): qty 5

## 2024-06-02 MED ORDER — ADULT MULTIVITAMIN W/MINERALS CH
1.0000 | ORAL_TABLET | Freq: Every day | ORAL | Status: DC
  Administered 2024-06-02 – 2024-06-04 (×3): 1 via ORAL
  Filled 2024-06-02 (×3): qty 1

## 2024-06-02 MED ORDER — LORAZEPAM 2 MG/ML IJ SOLN
1.0000 mg | INTRAMUSCULAR | Status: DC | PRN
  Administered 2024-06-02: 1 mg via INTRAVENOUS
  Filled 2024-06-02: qty 1

## 2024-06-02 MED ORDER — FLUCONAZOLE IN SODIUM CHLORIDE 200-0.9 MG/100ML-% IV SOLN
200.0000 mg | INTRAVENOUS | Status: DC
  Administered 2024-06-02 – 2024-06-03 (×2): 200 mg via INTRAVENOUS
  Filled 2024-06-02 (×3): qty 100

## 2024-06-02 MED ORDER — FERROUS SULFATE 325 (65 FE) MG PO TABS
325.0000 mg | ORAL_TABLET | Freq: Every day | ORAL | Status: DC
  Administered 2024-06-03 – 2024-06-04 (×2): 325 mg via ORAL
  Filled 2024-06-02 (×2): qty 1

## 2024-06-02 MED ORDER — SODIUM CHLORIDE 0.9% FLUSH: 3.0000 mL | INTRAVENOUS | Status: DC | PRN

## 2024-06-02 MED ORDER — ACETAMINOPHEN 650 MG RE SUPP: 650.0000 mg | Freq: Four times a day (QID) | RECTAL | Status: DC | PRN

## 2024-06-02 MED ORDER — IOHEXOL 300 MG/ML  SOLN
100.0000 mL | Freq: Once | INTRAMUSCULAR | Status: AC | PRN
  Administered 2024-06-02: 100 mL via INTRAVENOUS

## 2024-06-02 MED ORDER — ONDANSETRON HCL 4 MG/2ML IJ SOLN: 4.0000 mg | Freq: Four times a day (QID) | INTRAMUSCULAR | Status: DC | PRN

## 2024-06-02 MED ORDER — PROCHLORPERAZINE 25 MG RE SUPP
25.0000 mg | Freq: Once | RECTAL | Status: AC
  Administered 2024-06-02: 25 mg via RECTAL
  Filled 2024-06-02: qty 1

## 2024-06-02 MED ORDER — VANCOMYCIN HCL 2000 MG/400ML IV SOLN
2000.0000 mg | Freq: Once | INTRAVENOUS | Status: AC
  Administered 2024-06-02: 2000 mg via INTRAVENOUS
  Filled 2024-06-02: qty 400

## 2024-06-02 MED ORDER — ONDANSETRON HCL 4 MG PO TABS: 4.0000 mg | ORAL_TABLET | Freq: Four times a day (QID) | ORAL | Status: DC | PRN

## 2024-06-02 MED ORDER — LACTATED RINGERS IV BOLUS
30.0000 mL/kg | Freq: Once | INTRAVENOUS | Status: AC
  Administered 2024-06-02: 2637 mL via INTRAVENOUS

## 2024-06-02 MED ORDER — NYSTATIN-TRIAMCINOLONE 100000-0.1 UNIT/GM-% EX OINT
TOPICAL_OINTMENT | Freq: Two times a day (BID) | CUTANEOUS | Status: DC
  Administered 2024-06-02: 1 via TOPICAL
  Filled 2024-06-02 (×3): qty 15

## 2024-06-02 MED ORDER — GABAPENTIN 300 MG PO CAPS
300.0000 mg | ORAL_CAPSULE | Freq: Three times a day (TID) | ORAL | Status: AC
Start: 2024-06-02 — End: ?
  Administered 2024-06-02 – 2024-06-04 (×5): 300 mg via ORAL
  Filled 2024-06-02 (×5): qty 1

## 2024-06-02 MED ORDER — ACETAMINOPHEN 325 MG PO TABS
650.0000 mg | ORAL_TABLET | Freq: Once | ORAL | Status: AC
  Administered 2024-06-02: 650 mg via ORAL
  Filled 2024-06-02: qty 2

## 2024-06-02 MED ORDER — DIAZEPAM 5 MG PO TABS
5.0000 mg | ORAL_TABLET | Freq: Three times a day (TID) | ORAL | Status: AC
Start: 2024-06-02 — End: 2024-06-04
  Administered 2024-06-02 – 2024-06-04 (×6): 5 mg via ORAL
  Filled 2024-06-02 (×6): qty 1

## 2024-06-02 MED ORDER — FOLIC ACID 1 MG PO TABS
1.0000 mg | ORAL_TABLET | Freq: Every day | ORAL | Status: DC
  Administered 2024-06-03 – 2024-06-04 (×2): 1 mg via ORAL
  Filled 2024-06-02 (×2): qty 1

## 2024-06-02 MED ORDER — THIAMINE HCL 100 MG/ML IJ SOLN
100.0000 mg | Freq: Every day | INTRAMUSCULAR | Status: DC
  Filled 2024-06-02: qty 2

## 2024-06-02 MED ORDER — FOLIC ACID 1 MG PO TABS
1.0000 mg | ORAL_TABLET | Freq: Every day | ORAL | Status: DC
  Filled 2024-06-02: qty 1

## 2024-06-02 MED ORDER — VANCOMYCIN HCL IN DEXTROSE 1-5 GM/200ML-% IV SOLN: 1000.0000 mg | Freq: Once | INTRAVENOUS | Status: DC

## 2024-06-02 MED ORDER — PIPERACILLIN-TAZOBACTAM 3.375 G IVPB 30 MIN
3.3750 g | Freq: Once | INTRAVENOUS | Status: AC
  Administered 2024-06-02: 3.375 g via INTRAVENOUS
  Filled 2024-06-02: qty 50

## 2024-06-02 MED ORDER — SODIUM CHLORIDE 0.9 % IV SOLN: INTRAVENOUS | Status: AC

## 2024-06-02 MED ORDER — POLYETHYLENE GLYCOL 3350 17 G PO PACK: 17.0000 g | PACK | Freq: Every day | ORAL | Status: DC | PRN

## 2024-06-02 MED ORDER — ONDANSETRON HCL 4 MG/2ML IJ SOLN
4.0000 mg | Freq: Once | INTRAMUSCULAR | Status: AC
  Administered 2024-06-02: 4 mg via INTRAVENOUS
  Filled 2024-06-02: qty 2

## 2024-06-02 MED ORDER — NYSTATIN 100000 UNIT/ML MT SUSP
5.0000 mL | Freq: Four times a day (QID) | OROMUCOSAL | Status: DC
  Administered 2024-06-02: 500000 [IU] via ORAL
  Filled 2024-06-02: qty 5

## 2024-06-02 MED ORDER — THIAMINE MONONITRATE 100 MG PO TABS
100.0000 mg | ORAL_TABLET | Freq: Every day | ORAL | Status: DC
  Administered 2024-06-02 – 2024-06-04 (×3): 100 mg via ORAL
  Filled 2024-06-02 (×3): qty 1

## 2024-06-02 NOTE — ED Notes (Signed)
 This RN called [facility] 2W to give report. Secretary transferred me to [employer]. RN not available at this time. [facility] leaving with pt

## 2024-06-02 NOTE — ED Notes (Signed)
 ED TO INPATIENT HANDOFF REPORT  ED Nurse Name and Phone #: Eleanor, RN  S Name/Age/Gender Theone Harriette Marina 43 y.o. female Room/Bed: APOTF/OTF  Code Status   Code Status: Full Code  Home/SNF/Other Home Patient oriented to: self, place, time, and situation Is this baseline? Yes   Triage Complete: Triage complete  Chief Complaint Emesis, persistent [R11.15]  Triage Note Pt arrived via pOV c/o N/V and loss of appetite since this past weekend. Pt presents tachycardic and hypertensive in Triage as well and endorses pain in her hands.    Allergies Allergies  Allergen Reactions   Ibuprofen  Other (See Comments)    Gastric bypass surgery and history of GI bleed.   Lovenox   [Enoxaparin  Sodium] Hives    Level of Care/Admitting Diagnosis ED Disposition     ED Disposition  Admit   Condition  --   Comment  Hospital Area: [facility] [[id number]]  Level of Care: Med-Surg [16]  Diagnosis: Emesis, persistent [294221]  Admitting Physician: PEARLEAN RENDALL NEARING  Attending Physician: PEARLEAN RENDALL NEARING  For patients discharging to extended facilities (i.e. SNF, AL, group homes or LTAC) initiate:: Discharge to SNF/Facility Placement COVID-19 Lab Testing Protocol          B Medical/Surgery History Past Medical History:  Diagnosis Date   Neuropathy 01/09/2019   Pulmonary embolism [facility])    Past Surgical History:  Procedure Laterality Date   GASTRIC BYPASS     TONSILLECTOMY       A IV Location/Drains/Wounds Patient Lines/Drains/Airways Status     Active Line/Drains/Airways     Name Placement date Placement time Site Days   Peripheral IV 06/02/24 22 G Anterior;Left Hand 06/02/24  1054  Hand  less than 1   Peripheral IV 06/02/24 20 G Anterior;Distal;Left;Upper Arm 06/02/24  1117  Arm  less than 1            Intake/Output Last 24 hours  Intake/Output Summary (Last 24 hours) at 06/02/2024 1816 Last data filed at 06/02/2024  1449 Gross per 24 hour  Intake 3087 ml  Output --  Net 3087 ml    Labs/Imaging Results for orders placed or performed during the hospital encounter of 06/02/24 (from the past 48 hours)  Culture, blood (Routine x 2)     Status: None (Preliminary result)   Collection Time: 06/02/24 10:28 AM   Specimen: Vein; Blood  Result Value Ref Range   Specimen Description      LEFT ANTECUBITAL BOTTLES DRAWN AEROBIC AND ANAEROBIC   Special Requests      Blood Culture results may not be optimal due to an inadequate volume of blood received in culture bottles Performed at [facility], [address] [place], [place] [address]    Culture PENDING    Report Status PENDING   Group A Strep by PCR     Status: None   Collection Time: 06/02/24 10:44 AM   Specimen: Throat; Sterile Swab  Result Value Ref Range   Group A Strep by PCR NOT DETECTED NOT DETECTED    Comment: Performed at [facility], [address], [place], [place] [address]  CBC with Differential     Status: Abnormal   Collection Time: 06/02/24 10:47 AM  Result Value Ref Range   WBC 4.8 4.0 - 10.5 K/uL   RBC 4.35 3.87 - 5.11 MIL/uL   Hemoglobin 13.4 12.0 - 15.0 g/dL   HCT 59.5 63.9 - 53.9 %   MCV 92.9 80.0 - 100.0 fL   MCH  30.8 26.0 - 34.0 pg   MCHC 33.2 30.0 - 36.0 g/dL   RDW 83.0 (H) 88.4 - 84.4 %   Platelets 118 (L) 150 - 400 K/uL   nRBC 0.0 0.0 - 0.2 %   Neutrophils Relative % 80 %   Neutro Abs 3.8 1.7 - 7.7 K/uL   Lymphocytes Relative 17 %   Lymphs Abs 0.8 0.7 - 4.0 K/uL   Monocytes Relative 3 %   Monocytes Absolute 0.2 0.1 - 1.0 K/uL   Eosinophils Relative 0 %   Eosinophils Absolute 0.0 0.0 - 0.5 K/uL   Basophils Relative 0 %   Basophils Absolute 0.0 0.0 - 0.1 K/uL   Immature Granulocytes 0 %   Abs Immature Granulocytes 0.01 0.00 - 0.07 K/uL    Comment: Performed at [facility], [address], [place], [place] [address]  Comprehensive metabolic panel     Status: Abnormal   Collection Time: 06/02/24 10:47 AM  Result  Value Ref Range   Sodium 133 (L) 135 - 145 mmol/L   Potassium 4.1 3.5 - 5.1 mmol/L   Chloride 91 (L) 98 - 111 mmol/L   CO2 27 22 - 32 mmol/L   Glucose, Bld 96 70 - 99 mg/dL    Comment: Glucose reference range applies only to samples taken after fasting for at least 8 hours.   BUN 8 6 - 20 mg/dL   Creatinine, Ser 9.34 0.44 - 1.00 mg/dL   Calcium 89.5 (H) 8.9 - 10.3 mg/dL   Total Protein 8.0 6.5 - 8.1 g/dL   Albumin 4.1 3.5 - 5.0 g/dL   AST 626 (H) 15 - 41 U/L   ALT 129 (H) 0 - 44 U/L   Alkaline Phosphatase 190 (H) 38 - 126 U/L   Total Bilirubin 2.7 (H) 0.0 - 1.2 mg/dL   GFR, Estimated >39 >39 mL/min    Comment: (NOTE) Calculated using the CKD-EPI Creatinine Equation (2021)    Anion gap 16 (H) 5 - 15    Comment: Performed at [facility], [address], [place], [place] [address]  Lactic acid, plasma     Status: Abnormal   Collection Time: 06/02/24 10:47 AM  Result Value Ref Range   Lactic Acid, Venous 2.0 (HH) 0.5 - 1.9 mmol/L    Comment: Critical Value, Read Back and verified with E WALL RN [phone] K FORSYTH Performed at [facility], [address] [place], [place] [address]   Protime-INR     Status: None   Collection Time: 06/02/24 10:47 AM  Result Value Ref Range   Prothrombin Time 12.9 11.4 - 15.2 seconds   INR 0.9 0.8 - 1.2    Comment: (NOTE) INR goal varies based on device and disease states. Performed at [facility], [address] [place], [place] [address]   Culture, blood (Routine x 2)     Status: None (Preliminary result)   Collection Time: 06/02/24 10:47 AM   Specimen: Vein; Blood  Result Value Ref Range   Specimen Description      RIGHT ANTECUBITAL BOTTLES DRAWN AEROBIC AND ANAEROBIC   Special Requests      Blood Culture adequate volume Performed at [facility], [address], [place], [place] [address]    Culture PENDING    Report Status PENDING   Ethanol     Status: None   Collection Time: 06/02/24 10:47 AM  Result Value Ref Range   Alcohol,  Ethyl (B) <15 <15 mg/dL    Comment: (NOTE) For medical  purposes only. Performed at [facility], [address] [place], [place] [address]   Lipase, blood     Status: None   Collection Time: 06/02/24 10:47 AM  Result Value Ref Range   Lipase 25 11 - 51 U/L    Comment: Performed at [facility], [address] [place], [place] [address]  Urinalysis, w/ Reflex to Culture (Infection Suspected) -Urine, Clean Catch     Status: Abnormal   Collection Time: 06/02/24 11:32 AM  Result Value Ref Range   Specimen Source URINE, CATHETERIZED    Color, Urine AMBER (A) YELLOW    Comment: BIOCHEMICALS MAY BE AFFECTED BY COLOR   APPearance HAZY (A) CLEAR   Specific Gravity, Urine 1.025 1.005 - 1.030   pH 5.0 5.0 - 8.0   Glucose, UA NEGATIVE NEGATIVE mg/dL   Hgb urine dipstick SMALL (A) NEGATIVE   Bilirubin Urine SMALL (A) NEGATIVE   Ketones, ur 20 (A) NEGATIVE mg/dL   Protein, ur 30 (A) NEGATIVE mg/dL   Nitrite NEGATIVE NEGATIVE   Leukocytes,Ua NEGATIVE NEGATIVE   RBC / HPF 0-5 0 - 5 RBC/hpf   WBC, UA 0-5 0 - 5 WBC/hpf    Comment:        Reflex urine culture not performed if WBC <=10, OR if Squamous epithelial cells >5. If Squamous epithelial cells >5 suggest recollection.    Bacteria, UA RARE (A) NONE SEEN   Squamous Epithelial / HPF 0-5 0 - 5 /HPF   Mucus PRESENT     Comment: Performed at [facility], [address] [place], [place] [address]  Urine Drug Screen     Status: Abnormal   Collection Time: 06/02/24 11:32 AM  Result Value Ref Range   Opiates NEGATIVE NEGATIVE   Cocaine NEGATIVE NEGATIVE   Benzodiazepines POSITIVE (A) NEGATIVE   Amphetamines NEGATIVE NEGATIVE   Tetrahydrocannabinol NEGATIVE NEGATIVE   Barbiturates NEGATIVE NEGATIVE   Methadone Scn, Ur NEGATIVE NEGATIVE   Fentanyl NEGATIVE NEGATIVE    Comment: (NOTE) Drug screen is for Medical Purposes only. Positive results are preliminary only. If confirmation is needed, notify lab within 5 days.  Drug Class                  Cutoff (ng/mL) Amphetamine and metabolites 1000 Barbiturate and metabolites 200 Benzodiazepine              200 Opiates and metabolites     300 Cocaine and metabolites     300 THC                         50 Fentanyl                    5 Methadone                   300  Trazodone  is metabolized in vivo to several metabolites,  including pharmacologically active m-CPP, which is excreted in the  urine.  Immunoassay screens for amphetamines and MDMA have potential  cross-reactivity with these compounds and may provide false positive  result.  Performed at [facility], [address], [place], [place] [address]   POC urine preg, ED     Status: None   Collection Time: 06/02/24 11:34 AM  Result Value Ref Range   Preg Test, Ur NEGATIVE NEGATIVE    Comment:        THE SENSITIVITY OF THIS METHODOLOGY IS >20 mIU/mL.   Lactic acid, plasma  Status: Abnormal   Collection Time: 06/02/24  1:00 PM  Result Value Ref Range   Lactic Acid, Venous 2.7 (HH) 0.5 - 1.9 mmol/L    Comment: Critical Value, Read Back and verified with  EANES,M  AT 2:05PM ON 06/02/24 BY [facility] Performed at [facility], [address], [place], [place] [address]    CT ABDOMEN PELVIS W CONTRAST Result Date: 06/02/2024 EXAM: CT ABDOMEN AND PELVIS WITH CONTRAST 06/02/2024 12:23:01 PM TECHNIQUE: CT of the abdomen and pelvis was performed with the administration of 100 mL of iohexol  (OMNIPAQUE ) 300 MG/ML solution. Multiplanar reformatted images are provided for review. Automated exposure control, iterative reconstruction, and/or weight-based adjustment of the mA/kV was utilized to reduce the radiation dose to as low as reasonably achievable. COMPARISON: None available. CLINICAL HISTORY: Sepsis; groin/perineum erythema, concern for [place]. Pt arrived via pOV c/o N/V and loss of appetite since this past weekend. Pt presents tachycardic and hypertensive in Triage as well and endorses pain in her hands. FINDINGS: LOWER  CHEST: No acute abnormality. LIVER: Steatosis. GALLBLADDER AND BILE DUCTS: Minimal cholelithiasis. No biliary ductal dilatation. SPLEEN: No acute abnormality. PANCREAS: No acute abnormality. ADRENAL GLANDS: No acute abnormality. KIDNEYS, URETERS AND BLADDER: No stones in the kidneys or ureters. No hydronephrosis. No perinephric or periureteral stranding. Urinary bladder is unremarkable. GI AND BOWEL: Status post gastric bypass. Stomach demonstrates no acute abnormality. There is no bowel obstruction. PERITONEUM AND RETROPERITONEUM: No ascites. No free air. VASCULATURE: Aorta is normal in caliber. LYMPH NODES: No lymphadenopathy. REPRODUCTIVE ORGANS: No acute abnormality. BONES AND SOFT TISSUES: No acute osseous abnormality. No focal soft tissue abnormality. IMPRESSION: 1. No acute intra-abdominal or pelvic pathology. 2. Cholelithiasis. 3. Hepatic steatosis. 4. Postoperative changes of gastric bypass. Electronically signed by: Lynwood Seip MD 06/02/2024 12:46 PM EDT RP Workstation: [id number]   DG Chest Port 1 View if patient is in a treatment room. Result Date: 06/02/2024 EXAM: 1 VIEW(S) XRAY OF THE CHEST 06/02/2024 11:51:00 AM COMPARISON: 08/31/2020 CLINICAL HISTORY: Suspected Sepsis. Pt arrived via pOV c/o N/V and loss of appetite since this past weekend. Pt presents tachycardic and hypertensive in Triage as well and endorses pain in her hands. Hx of PE. Current some day smoker. FINDINGS: LUNGS AND PLEURA: No focal pulmonary opacity. No pulmonary edema. No pleural effusion. No pneumothorax. HEART AND MEDIASTINUM: No acute abnormality of the cardiac and mediastinal silhouettes. BONES AND SOFT TISSUES: No acute osseous abnormality. IMPRESSION: 1. No acute cardiopulmonary disease. Electronically signed by: Lynwood Seip MD 06/02/2024 12:04 PM EDT RP Workstation: [id number]    Pending Labs Unresulted Labs (From admission, onward)     Start     Ordered   06/03/24 0500  Comprehensive metabolic panel with GFR   Tomorrow morning,   R        06/02/24 1425   06/02/24 1748  Lactic acid, plasma  (Lactic Acid)  ONCE - URGENT,   URGENT        06/02/24 1747   06/02/24 1727  HIV Antibody (routine testing w rflx)  (HIV Antibody (Routine testing w reflex) panel)  Once,   R        06/02/24 1728   06/02/24 1230  Urine Drug Screen  Once,   R        06/02/24 1230            Vitals/Pain Today's Vitals   06/02/24 1730 06/02/24 1752 06/02/24 1755 06/02/24 1800  BP: (!) 125/90 (!) 129/102 (!) 129/102 128/88  Pulse: (!) 106 (!) 115  Resp: 17  12 16   Temp:      TempSrc:      SpO2: 99%     Weight:      Height:      PainSc:        Isolation Precautions No active isolations  Medications Medications  0.9 %  sodium chloride  infusion ( Intravenous Transfusing/Transfer 06/02/24 1802)  nystatin-triamcinolone ointment (MYCOLOG) (1 Application Topical Given 06/02/24 1748)  fluconazole (DIFLUCAN) IVPB 200 mg (200 mg Intravenous Transfusing/Transfer 06/02/24 1802)  LORazepam  (ATIVAN ) tablet 1-4 mg ( Oral See Alternative 06/02/24 1759)    Or  LORazepam  (ATIVAN ) injection 1-4 mg (1 mg Intravenous Given 06/02/24 1759)  thiamine  (VITAMIN B1) tablet 100 mg (100 mg Oral Given 06/02/24 1746)    Or  thiamine  (VITAMIN B1) injection 100 mg ( Intravenous See Alternative 06/02/24 1746)  multivitamin with minerals tablet 1 tablet (1 tablet Oral Given 06/02/24 1746)  diazepam  (VALIUM ) tablet 5 mg (5 mg Oral Given 06/02/24 1746)  ferrous sulfate  tablet 325 mg (has no administration in time range)  folic acid  (FOLVITE ) tablet 1 mg (has no administration in time range)  gabapentin  (NEURONTIN ) capsule 300 mg (has no administration in time range)  sodium chloride  flush (NS) 0.9 % injection 3 mL (has no administration in time range)  sodium chloride  flush (NS) 0.9 % injection 3 mL (has no administration in time range)  sodium chloride  flush (NS) 0.9 % injection 3 mL (has no administration in time range)  0.9 %  sodium  chloride infusion (has no administration in time range)  acetaminophen  (TYLENOL ) tablet 650 mg (has no administration in time range)    Or  acetaminophen  (TYLENOL ) suppository 650 mg (has no administration in time range)  traZODone  (DESYREL ) tablet 50 mg (has no administration in time range)  polyethylene glycol (MIRALAX / GLYCOLAX) packet 17 g (has no administration in time range)  bisacodyl (DULCOLAX) suppository 10 mg (has no administration in time range)  ondansetron  (ZOFRAN ) tablet 4 mg (has no administration in time range)    Or  ondansetron  (ZOFRAN ) injection 4 mg (has no administration in time range)  nystatin (MYCOSTATIN) 100000 UNIT/ML suspension 500,000 Units (has no administration in time range)  lactated ringers  bolus 2,637 mL (0 mLs Intravenous Stopped 06/02/24 1449)  vancomycin (VANCOREADY) IVPB 2000 mg/400 mL (0 mg Intravenous Stopped 06/02/24 1449)  ondansetron  (ZOFRAN ) injection 4 mg (4 mg Intravenous Given 06/02/24 1103)  piperacillin-tazobactam (ZOSYN) IVPB 3.375 g (0 g Intravenous Stopped 06/02/24 1239)  iohexol  (OMNIPAQUE ) 300 MG/ML solution 100 mL (100 mLs Intravenous Contrast Given 06/02/24 1204)  acetaminophen  (TYLENOL ) tablet 650 mg (650 mg Oral Given 06/02/24 1221)  prochlorperazine  (COMPAZINE ) suppository 25 mg (25 mg Rectal Given 06/02/24 1736)    Mobility walks     Focused Assessments Yeast Infection to mouth and groin with elevated liver enzymes r/t ETOH   R Recommendations: See Admitting Provider Note  Report given to:   Additional Notes: . CIWA 6 at 1800; 1mg  ativan  given

## 2024-06-02 NOTE — ED Notes (Signed)
 Pt ambulated to the bathroom with standby assist

## 2024-06-02 NOTE — Plan of Care (Signed)

## 2024-06-02 NOTE — H&P (Addendum)
 History and Physical   Patient: Stacy [name] FMW:[id number] DOB: [birth year] DOA: 06/02/2024 DOS: the patient was seen and examined on 06/02/2024 PCP: [facility]  Patient coming from: Home  Chief Complaint:  Chief Complaint  Patient presents with   Emesis   HPI: Stacy [name] is a 43 y.o. female with medical history  significant for prior h/o  gastric bypass , etoh abuse, obesity and hepatic steatosis, chronic cervical and lumbar disc disease with history of peripheral neuropathy, as well as history of prior PE, and ongoing tobacco abuse , and chronic anemia presenting with persistent/intractable emesis as well as  extensive rash in the perineum and groin area as well as scaly peeling lesions on the hands, tongue lesions/white patches suggestive of candidiasis and found to have elevated LFTs - Current outpatient emesis has persisted for the last 4 to 5 days - Last EtOH intake was 05/31/2024--has been drinking about 2 bottles of wine each day prior to that No fever  Or chills  - Emesis is without bile or blood - Had normal BM on 06/01/2024 - CT abdomen and pelvis in the ED without acute intra-abdominal or pelvic pathology, cholelithiasis and hepatic steatosis noted as well as postoperative changes of prior gastric bypass - Chest x-ray without acute cardiopulmonary findings - Urine tox screen with benzos - UA not suggestive of UTI - Lipase WNL at 25 - Blood alcohol level is less than 15 - Lactic acid 2.0 >>2.7  Sodium is 133, potassium 4.1 chloride 91 creatinine 0.65 BUN 8 calcium 10.4,  -AST up to 353 , it was 60 on 04/22/2024 - ALT 129, it was 30 on 04/22/2024 - Alk phos 198 was 103 on 04/22/2024  -T. bili down to 2.7 from 4.4 on 04/22/2024 - Anion gap is 16 with bicarb of 27 and glucose of 96 - Hemoglobin 13.4 WBC 4.8  -platelets down to 118 from 235 on 04/22/2024 - Strep test negative - RN Melissa present at bedside throughout my visit with patient  including during exam of groin and perineal areas  Review of Systems: As mentioned in the history of present illness. All other systems reviewed and are negative. Past Medical History:  Diagnosis Date   Neuropathy 01/09/2019   Pulmonary embolism [facility])    Past Surgical History:  Procedure Laterality Date   GASTRIC BYPASS     TONSILLECTOMY     Social History:  reports that she has been smoking cigarettes. She has never used smokeless tobacco. She reports current alcohol use. She reports that she does not use drugs.  Allergies  Allergen Reactions   Ibuprofen  Other (See Comments)    Gastric bypass surgery and history of GI bleed.   Lovenox   [Enoxaparin  Sodium] Hives    Family History  Problem Relation Age of Onset   Chronic infections Mother    Diabetes Mother    Healthy Father    High blood pressure Maternal Grandfather    Diabetes Maternal Grandfather    Diabetes Paternal Grandmother    Cancer Paternal Grandmother     Prior to Admission medications   Medication Sig Start Date End Date Taking? Authorizing Provider  ferrous sulfate  325 (65 FE) MG tablet Take 1 tablet (325 mg total) by mouth daily with breakfast. 04/02/24  Yes Ourania Hamler, MD  folic acid  (FOLVITE ) 1 MG tablet Take 1 tablet (1 mg total) by mouth daily. 04/02/24  Yes Nhia Heaphy, MD  gabapentin  (NEURONTIN ) 300 MG capsule Take 600 mg by mouth qam,  300 mg midday,  then 600 mg at bedtime Patient taking differently: Take 300 mg by mouth 3 (three) times daily. 04/22/24  Yes Idol, Mliss, PA-C  Vitamin D , Ergocalciferol , (DRISDOL ) 1.25 MG (50000 UNIT) CAPS capsule Take 50,000 Units by mouth once a week. 04/26/24  Yes [provider]    Physical Exam: Vitals:   06/02/24 1730 06/02/24 1752 06/02/24 1755 06/02/24 1800  BP: (!) 125/90 (!) 129/102 (!) 129/102 128/88  Pulse: (!) 106 (!) 115    Resp: 17  12 16   Temp:      TempSrc:      SpO2: 99%     Weight:      Height:        Physical Exam Gen:-  Awake Alert, in no acute distress  HEENT:- .AT, No sclera icterus Mouth--edentulous, white patchy lesions on tongue, palate, buccal mucosa Neck-Supple Neck,No JVD,.  Lungs-  CTAB , fair air movement bilaterally  CV- S1, S2 normal, RRR Abd-  +ve B.Sounds, Abd Soft, No tenderness,    Extremity/Skin:- No  edema,   good pedal pulses --significant erythema, excoriations, ?? Satelite lesions in lower abdominal folds, perineum and groin areas consistent with candidal intertrigo-- -scaly, peeling lesions on the hands Psych-affect is appropriate, oriented x3 Neuro-generalized weakness, no new focal deficits, mild tremors RN Melissa present at bedside throughout my visit with patient including during exam of groin and perineal areas -  Media Information   Document Information  Photos    06/02/2024 10:24  Attached To:  Hospital Encounter on 06/02/24  Source Information  Idol, Mliss RIGGERS  [facility]     Media Information   Document Information  Photos    06/02/2024 15:13  Attached To:  Hospital Encounter on 06/02/24  Source Information  Pearlean Manus, MD  [facility]    Data Reviewed:  CT abdomen and pelvis in the ED without acute intra-abdominal or pelvic pathology, cholelithiasis and hepatic steatosis noted as well as postoperative changes of prior gastric bypass - Chest x-ray without acute cardiopulmonary findings - Urine tox screen with benzos - UA not suggestive of UTI - Lipase WNL at 25 - Blood alcohol level is less than 15 - Lactic acid 2.0 >>2.7  Sodium is 133, potassium 4.1 chloride 91 creatinine 0.65 BUN 8 calcium 10.4,  -AST up to 353 , it was 60 on 04/22/2024 - ALT 129, it was 30 on 04/22/2024 - Alk phos 198 was 103 on 04/22/2024  -T. bili down to 2.7 from 4.4 on 04/22/2024 - Anion gap is 16 with bicarb of 27 and glucose of 96 - Hemoglobin 13.4 WBC 4.8  -platelets down to 118 from 235 on 04/22/2024 - Strep test negative  Assessment and  Plan: 1) intractable emesis--- emesis without blood or bile - CTAP without acute findings, no fevers or leukocytosis, lipase WNL --Abdominal exam is benign -History of gastric bypass, history of cyclical vomiting in the past -Negative urine tox -As needed antiemetics -IV fluids until oral intake is more reliable  2) alcohol abuse with mild DTs---history of DTs -Last EtOH intake was 05/31/2024--has been drinking about 2 bottles of wine each day prior to that  -Benzos per CIWA protocol as ordered -Folic acid  multivitamin and thiamine  as ordered -In the past patient did wear with oral Librium  taper on discharge - 3)S/p  post gastric bypass with chronic anemia and concerns for malabsorption and vitamin deficiencies -Recent workup shows:- - Ferritin is not low, serum iron is not low but TIBC is low -  B12 is low normal, folate is low, vitamin D  is low -Give vitamin replacement --Complete abstinence from alcohol advised patient benefit from multivitamin use daily -May need IM B12 injections due to concerns for poor B12 absorption in the gastric tract given prior gastric bypass -Hemoglobin is currently 13.4 hemoglobin was between 9 and 10 back in August 2025--- I suspect patient may be hemoconcentrated/dehydrated at this time -Anticipate drop in H&H with hydration  4)Lactic Acidosis---- Lactic acid 2.0 >>2.7  -- Suspect due to dehydration - No fevers or leukocytosis - CTAP without acute findings -- Hydrate and recheck lactic acid   5)Fungal infection--- patient with oral pharyngeal candidiasis -Patient with candidal intertrigo of the groin and perineum-please see photos in epic-- -check HIV status --treat empirically with IV Diflucan--monitor LFTs closely -Topical nystatin/triamcinolone (mycolog) cream to groin and perineum --Oral nystatin suspension swish and swallow as ordered   6)Alcoholic Transaminitis/hepatic steatosis --worsening LFTs in the setting of recent heavy drinking -AST  up to 353 , it was 60 on 04/22/2024 - ALT 129, it was 30 on 04/22/2024 AST to  ALT ratio is greater than  to 2 :1 -consistent with alcoholic hepatitis - Alk phos 801 was 103 on 04/22/2024  -T. bili down to 2.7 from 4.4 on 04/22/2024 --Abstinence from alcohol advised, alcohol rehab suggested patient will think about   7))Neck and back pain with radiculopathy and paresthesia--recent MRI lumbar spine shows  Isolated advanced chronic disc and endplate degeneration at L4-L5. Bulky lumbar facet arthropathy elsewhere and  Up to mild spinal and moderate biforaminal stenosis results at L4-L5 - MRI of the C-spine without acute findings patient does have-Chronic disc and endplate degeneration, maximal at C5-C6, and primarily affecting the neural foramina. No cervical spinal stenosis. Moderate to severe Left side C5 and C6 neural foraminal stenosis, ---outpatient follow-up with neurosurgeon as previously advised  8) acute thrombocytopenia---platelets down to 118 from 235 on 04/22/2024 -- Suspect mostly due to direct toxic effect of alcohol on bone marrow - Doubt liver cirrhosis  9) mild hyponatremia/hypochloremia--due to dehydration in the setting of ongoing emesis (GI losses)  and poor oral intake -- Hydrate  10)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   Advance Care Planning:   Code Status: Full Code   Family Communication: None available at bedside  Severity of Illness: The appropriate patient status for this patient is OBSERVATION. Observation status is judged to be reasonable and necessary in order to provide the required intensity of service to ensure the patient's safety. The patient's presenting symptoms, physical exam findings, and initial radiographic and laboratory data in the context of their medical condition is felt to place them at decreased risk for further clinical  deterioration. Furthermore, it is anticipated that the patient will be medically stable for discharge from the hospital within 2 midnights of admission.   Author: Rendall Carwin, MD 06/02/2024 6:07 PM  For on call review [email or web address].

## 2024-06-02 NOTE — ED Notes (Signed)
[facility] present to transport pt

## 2024-06-02 NOTE — ED Provider Notes (Signed)
  EMERGENCY DEPARTMENT AT [facility] Provider Note   CSN: [id number] Arrival date & time: 06/02/24  0945     Patient presents with: Emesis   Peg Janyth Riera is a 43 y.o. female with a history including alcohol dependence disorder, patient stating she last had a drink about 1 week ago, also has a history of hepatic steatosis, history of PE, chronic peripheral neuropathy presenting with nausea and vomiting which started 4 to 5 days ago along with decreased appetite.  She also woke with a rash in her groin and mouth and has noted that her hands are scaly and peeling, started 2 days ago.  The area in her groin she reports has been very painful, she denies pain in her mouth, tongue or throat.  She has had no recognized fevers, no new medications or OTC medicines.   The history is provided by the patient.       Prior to Admission medications   Medication Sig Start Date End Date Taking? Authorizing Provider  ferrous sulfate  325 (65 FE) MG tablet Take 1 tablet (325 mg total) by mouth daily with breakfast. 04/02/24  Yes Emokpae, Courage, MD  folic acid  (FOLVITE ) 1 MG tablet Take 1 tablet (1 mg total) by mouth daily. 04/02/24  Yes Pearlean Manus, MD  gabapentin  (NEURONTIN ) 300 MG capsule Take 600 mg by mouth qam,  300 mg midday,  then 600 mg at bedtime Patient taking differently: Take 300 mg by mouth 3 (three) times daily. 04/22/24  Yes Zacaria Pousson, Mliss, PA-C  Vitamin D , Ergocalciferol , (DRISDOL ) 1.25 MG (50000 UNIT) CAPS capsule Take 50,000 Units by mouth once a week. 04/26/24  Yes [provider]  chlordiazePOXIDE  (LIBRIUM ) 5 MG capsule Take 1 capsule (5 mg total) by mouth See admin instructions. Take Librium  (CHLORDIAZEPOXIDE  HCL CAPS) Take 1 Capsule 3 times a day for 2 days, then take 1 cap 2 times a day for 2 days, then 1 cap daily x 2 days , then STOP----this will Help You Quit Drinking Alcohol.... - Please do Not drink alcohol while taking this medication Patient  not taking: Reported on 04/22/2024 04/01/24   Pearlean Manus, MD    Allergies: Ibuprofen  and Lovenox   [enoxaparin  sodium]    Review of Systems  HENT:  Positive for mouth sores. Negative for congestion.   Respiratory:  Negative for chest tightness and shortness of breath.     Updated Vital Signs BP 126/85   Pulse (!) 107   Temp (!) 97.5 F (36.4 C) (Temporal)   Resp 16   Ht 5' 2 (1.575 m)   Wt 87.9 kg   SpO2 98%   BMI 35.44 kg/m   Physical Exam Vitals and nursing note reviewed.  Constitutional:      Appearance: She is well-developed. She is ill-appearing.  HENT:     Head: Normocephalic and atraumatic.     Mouth/Throat:     Pharynx: Posterior oropharyngeal erythema present.     Comments: Patchy erythema tongue and lower lip Eyes:     Conjunctiva/sclera: Conjunctivae normal.  Cardiovascular:     Rate and Rhythm: Regular rhythm.     Heart sounds: Normal heart sounds.  Pulmonary:     Effort: Pulmonary effort is normal.     Breath sounds: Normal breath sounds. No wheezing.  Abdominal:     General: Bowel sounds are normal.     Palpations: Abdomen is soft.     Tenderness: There is no abdominal tenderness.  Musculoskeletal:  General: Normal range of motion.     Cervical back: Normal range of motion.  Skin:    General: Skin is warm.     Findings: Erythema and rash present.     Comments: Localizing to groin and medial upper thighs.    Neurological:     Mental Status: She is alert.          (all labs ordered are listed, but only abnormal results are displayed) Labs Reviewed  CBC WITH DIFFERENTIAL/PLATELET - Abnormal; Notable for the following components:      Result Value   RDW 16.9 (*)    Platelets 118 (*)    All other components within normal limits  COMPREHENSIVE METABOLIC PANEL WITH GFR - Abnormal; Notable for the following components:   Sodium 133 (*)    Chloride 91 (*)    Calcium 10.4 (*)    AST 373 (*)    ALT 129 (*)    Alkaline Phosphatase  190 (*)    Total Bilirubin 2.7 (*)    Anion gap 16 (*)    All other components within normal limits  LACTIC ACID, PLASMA - Abnormal; Notable for the following components:   Lactic Acid, Venous 2.0 (*)    All other components within normal limits  LACTIC ACID, PLASMA - Abnormal; Notable for the following components:   Lactic Acid, Venous 2.7 (*)    All other components within normal limits  URINALYSIS, W/ REFLEX TO CULTURE (INFECTION SUSPECTED) - Abnormal; Notable for the following components:   Color, Urine AMBER (*)    APPearance HAZY (*)    Hgb urine dipstick SMALL (*)    Bilirubin Urine SMALL (*)    Ketones, ur 20 (*)    Protein, ur 30 (*)    Bacteria, UA RARE (*)    All other components within normal limits  URINE DRUG SCREEN - Abnormal; Notable for the following components:   Benzodiazepines POSITIVE (*)    All other components within normal limits  CULTURE, BLOOD (ROUTINE X 2)  CULTURE, BLOOD (ROUTINE X 2)  GROUP A STREP BY PCR  PROTIME-INR  ETHANOL  URINE DRUG SCREEN  LIPASE, BLOOD  POC URINE PREG, ED    EKG: None  Radiology: CT ABDOMEN PELVIS W CONTRAST Result Date: 06/02/2024 EXAM: CT ABDOMEN AND PELVIS WITH CONTRAST 06/02/2024 12:23:01 PM TECHNIQUE: CT of the abdomen and pelvis was performed with the administration of 100 mL of iohexol  (OMNIPAQUE ) 300 MG/ML solution. Multiplanar reformatted images are provided for review. Automated exposure control, iterative reconstruction, and/or weight-based adjustment of the mA/kV was utilized to reduce the radiation dose to as low as reasonably achievable. COMPARISON: None available. CLINICAL HISTORY: Sepsis; groin/perineum erythema, concern for Fourniers. Pt arrived via pOV c/o N/V and loss of appetite since this past weekend. Pt presents tachycardic and hypertensive in Triage as well and endorses pain in her hands. FINDINGS: LOWER CHEST: No acute abnormality. LIVER: Steatosis. GALLBLADDER AND BILE DUCTS: Minimal cholelithiasis.  No biliary ductal dilatation. SPLEEN: No acute abnormality. PANCREAS: No acute abnormality. ADRENAL GLANDS: No acute abnormality. KIDNEYS, URETERS AND BLADDER: No stones in the kidneys or ureters. No hydronephrosis. No perinephric or periureteral stranding. Urinary bladder is unremarkable. GI AND BOWEL: Status post gastric bypass. Stomach demonstrates no acute abnormality. There is no bowel obstruction. PERITONEUM AND RETROPERITONEUM: No ascites. No free air. VASCULATURE: Aorta is normal in caliber. LYMPH NODES: No lymphadenopathy. REPRODUCTIVE ORGANS: No acute abnormality. BONES AND SOFT TISSUES: No acute osseous abnormality. No focal soft tissue abnormality.  IMPRESSION: 1. No acute intra-abdominal or pelvic pathology. 2. Cholelithiasis. 3. Hepatic steatosis. 4. Postoperative changes of gastric bypass. Electronically signed by: Lynwood Seip MD 06/02/2024 12:46 PM EDT RP Workstation: [id number]   DG Chest Port 1 View if patient is in a treatment room. Result Date: 06/02/2024 EXAM: 1 VIEW(S) XRAY OF THE CHEST 06/02/2024 11:51:00 AM COMPARISON: 08/31/2020 CLINICAL HISTORY: Suspected Sepsis. Pt arrived via pOV c/o N/V and loss of appetite since this past weekend. Pt presents tachycardic and hypertensive in Triage as well and endorses pain in her hands. Hx of PE. Current some day smoker. FINDINGS: LUNGS AND PLEURA: No focal pulmonary opacity. No pulmonary edema. No pleural effusion. No pneumothorax. HEART AND MEDIASTINUM: No acute abnormality of the cardiac and mediastinal silhouettes. BONES AND SOFT TISSUES: No acute osseous abnormality. IMPRESSION: 1. No acute cardiopulmonary disease. Electronically signed by: Lynwood Seip MD 06/02/2024 12:04 PM EDT RP Workstation: [id number]     Procedures   Medications Ordered in the ED  piperacillin-tazobactam (ZOSYN) IVPB 3.375 g (has no administration in time range)  0.9 %  sodium chloride  infusion (has no administration in time range)  nystatin-triamcinolone ointment  (MYCOLOG) (has no administration in time range)  fluconazole (DIFLUCAN) IVPB 200 mg (has no administration in time range)  lactated ringers  bolus 2,637 mL (0 mLs Intravenous Stopped 06/02/24 1449)  vancomycin (VANCOREADY) IVPB 2000 mg/400 mL (0 mg Intravenous Stopped 06/02/24 1449)  ondansetron  (ZOFRAN ) injection 4 mg (4 mg Intravenous Given 06/02/24 1103)  piperacillin-tazobactam (ZOSYN) IVPB 3.375 g (0 g Intravenous Stopped 06/02/24 1239)  iohexol  (OMNIPAQUE ) 300 MG/ML solution 100 mL (100 mLs Intravenous Contrast Given 06/02/24 1204)  acetaminophen  (TYLENOL ) tablet 650 mg (650 mg Oral Given 06/02/24 1221)                                    Medical Decision Making Pt with 5 day hx of n/v with severe painful rash groin, non painful rash tongue with chapped peeling hands.  Pt ddx including Elspeth Louder, scalded skin syndrome,  TSS, strep infection.  Labs, cultures,  IV fluids and broad spectrum abx started.    Labs and imaging as outlined below. No fourniers gangrene on CT imaging.  Severe rash of unclear etiology with elevated lactic acids,  yet she remains afebrile and has a normal wbc count.  Blood cultures have been ordered.    Pt will need admisision.  Call placed to hospitalist  - Dr. Pearlean accepts pt for admission.   Amount and/or Complexity of Data Reviewed Labs: ordered.    Details: Labs are significant for normal CBC, WBCs count of 4.8.  Her CMET reveals an AST 373, ALT of 129, alk phos 190, total bilirubin 2.7, she does have a history of alcohol abuse, she does not have a diagnosis of liver cirrhosis, reports her last drink was 1 week ago.  Urinalysis reveals 20 ketones, rare bacteria, UDS is positive for benzodiazepines.  Her initial lactic acid was 2.0, repeat 2.7. Radiology: ordered.    Details: CT negative for acute intra-abdominal pathology.  Cholelithiasis, hepatic steatosis. Discussion of management or test interpretation with external provider(s): Patient discussed  with Dr. Pearlean who accepts patient for admission  Risk Decision regarding hospitalization.        Final diagnoses:  Nausea and vomiting, unspecified vomiting type  Rash    ED Discharge Orders     None  Birdena Clarity, PA-C 06/02/24 1525    Kammerer, Megan L, DO 06/06/24 [id number]

## 2024-06-02 NOTE — ED Triage Notes (Signed)
 Pt arrived via pOV c/o N/V and loss of appetite since this past weekend. Pt presents tachycardic and hypertensive in Triage as well and endorses pain in her hands.

## 2024-06-03 DIAGNOSIS — E871 Hypo-osmolality and hyponatremia: Secondary | ICD-10-CM | POA: Diagnosis present

## 2024-06-03 DIAGNOSIS — K7010 Alcoholic hepatitis without ascites: Secondary | ICD-10-CM | POA: Diagnosis present

## 2024-06-03 DIAGNOSIS — K760 Fatty (change of) liver, not elsewhere classified: Secondary | ICD-10-CM | POA: Diagnosis present

## 2024-06-03 DIAGNOSIS — E66812 Obesity, class 2: Secondary | ICD-10-CM | POA: Diagnosis present

## 2024-06-03 DIAGNOSIS — D539 Nutritional anemia, unspecified: Secondary | ICD-10-CM | POA: Diagnosis present

## 2024-06-03 DIAGNOSIS — B3789 Other sites of candidiasis: Secondary | ICD-10-CM | POA: Diagnosis present

## 2024-06-03 DIAGNOSIS — Z6835 Body mass index (BMI) 35.0-35.9, adult: Secondary | ICD-10-CM | POA: Diagnosis not present

## 2024-06-03 DIAGNOSIS — E538 Deficiency of other specified B group vitamins: Secondary | ICD-10-CM | POA: Diagnosis present

## 2024-06-03 DIAGNOSIS — Z888 Allergy status to other drugs, medicaments and biological substances status: Secondary | ICD-10-CM | POA: Diagnosis not present

## 2024-06-03 DIAGNOSIS — R21 Rash and other nonspecific skin eruption: Secondary | ICD-10-CM | POA: Diagnosis present

## 2024-06-03 DIAGNOSIS — R7881 Bacteremia: Secondary | ICD-10-CM | POA: Diagnosis present

## 2024-06-03 DIAGNOSIS — B372 Candidiasis of skin and nail: Secondary | ICD-10-CM | POA: Diagnosis present

## 2024-06-03 DIAGNOSIS — Z833 Family history of diabetes mellitus: Secondary | ICD-10-CM | POA: Diagnosis not present

## 2024-06-03 DIAGNOSIS — F17210 Nicotine dependence, cigarettes, uncomplicated: Secondary | ICD-10-CM | POA: Diagnosis present

## 2024-06-03 DIAGNOSIS — B370 Candidal stomatitis: Secondary | ICD-10-CM | POA: Diagnosis present

## 2024-06-03 DIAGNOSIS — K909 Intestinal malabsorption, unspecified: Secondary | ICD-10-CM | POA: Diagnosis present

## 2024-06-03 DIAGNOSIS — E860 Dehydration: Secondary | ICD-10-CM | POA: Diagnosis present

## 2024-06-03 DIAGNOSIS — R1115 Cyclical vomiting syndrome unrelated to migraine: Secondary | ICD-10-CM | POA: Diagnosis not present

## 2024-06-03 DIAGNOSIS — Z886 Allergy status to analgesic agent status: Secondary | ICD-10-CM | POA: Diagnosis not present

## 2024-06-03 DIAGNOSIS — F10230 Alcohol dependence with withdrawal, uncomplicated: Secondary | ICD-10-CM | POA: Diagnosis present

## 2024-06-03 DIAGNOSIS — E878 Other disorders of electrolyte and fluid balance, not elsewhere classified: Secondary | ICD-10-CM | POA: Diagnosis present

## 2024-06-03 DIAGNOSIS — Z9884 Bariatric surgery status: Secondary | ICD-10-CM | POA: Diagnosis not present

## 2024-06-03 DIAGNOSIS — D6959 Other secondary thrombocytopenia: Secondary | ICD-10-CM | POA: Diagnosis present

## 2024-06-03 DIAGNOSIS — R112 Nausea with vomiting, unspecified: Secondary | ICD-10-CM | POA: Diagnosis present

## 2024-06-03 DIAGNOSIS — E872 Acidosis, unspecified: Secondary | ICD-10-CM | POA: Diagnosis present

## 2024-06-03 DIAGNOSIS — Z86711 Personal history of pulmonary embolism: Secondary | ICD-10-CM | POA: Diagnosis not present

## 2024-06-03 LAB — COMPREHENSIVE METABOLIC PANEL WITH GFR
ALT: 75 U/L — ABNORMAL HIGH (ref 0–44)
AST: 175 U/L — ABNORMAL HIGH (ref 15–41)
Albumin: 2.3 g/dL — ABNORMAL LOW (ref 3.5–5.0)
Alkaline Phosphatase: 114 U/L (ref 38–126)
Anion gap: 11 (ref 5–15)
BUN: 7 mg/dL (ref 6–20)
CO2: 22 mmol/L (ref 22–32)
Calcium: 8.4 mg/dL — ABNORMAL LOW (ref 8.9–10.3)
Chloride: 101 mmol/L (ref 98–111)
Creatinine, Ser: 0.65 mg/dL (ref 0.44–1.00)
GFR, Estimated: >60 mL/min (ref >60)
Glucose, Bld: 87 mg/dL (ref 70–99)
Potassium: 3.7 mmol/L (ref 3.5–5.1)
Sodium: 134 mmol/L — ABNORMAL LOW (ref 135–145)
Total Bilirubin: 1.7 mg/dL — ABNORMAL HIGH (ref 0.0–1.2)
Total Protein: 5.3 g/dL — ABNORMAL LOW (ref 6.5–8.1)

## 2024-06-03 LAB — BLOOD CULTURE ID PANEL (REFLEXED) - BCID2

## 2024-06-03 MED ORDER — OXYCODONE-ACETAMINOPHEN 5-325 MG PO TABS
1.0000 | ORAL_TABLET | Freq: Four times a day (QID) | ORAL | Status: AC | PRN
Start: 2024-06-03 — End: ?
  Administered 2024-06-03 – 2024-06-04 (×3): 1 via ORAL
  Filled 2024-06-03 (×3): qty 1

## 2024-06-03 MED ORDER — CYANOCOBALAMIN 1000 MCG/ML IJ SOLN
1000.0000 ug | Freq: Every day | INTRAMUSCULAR | Status: DC
  Administered 2024-06-03 – 2024-06-04 (×2): 1000 ug via INTRAMUSCULAR
  Filled 2024-06-03 (×2): qty 1

## 2024-06-03 MED ORDER — VANCOMYCIN HCL 1500 MG/300ML IV SOLN
1500.0000 mg | Freq: Once | INTRAVENOUS | Status: AC
  Administered 2024-06-03: 1500 mg via INTRAVENOUS
  Filled 2024-06-03: qty 300

## 2024-06-03 MED ORDER — VANCOMYCIN HCL IN DEXTROSE 1-5 GM/200ML-% IV SOLN: 1000.0000 mg | Freq: Two times a day (BID) | INTRAVENOUS | Status: DC

## 2024-06-03 NOTE — Progress Notes (Signed)
 PHARMACY - PHYSICIAN COMMUNICATION CRITICAL VALUE ALERT - BLOOD CULTURE IDENTIFICATION (BCID)  Stacy [name] is an 43 y.o. female who presented to [facility] on 06/02/2024 with a chief complaint of intractable emesis, white lesions/patches on tongue, scaly lesions of hands, and extensive rash of the perineum/groin area.   Assessment:  GPC 1/4 bottles (anaerobic) MRSE  - CT A/P showing cholelithiasis, hepatic steatosis, but no acute pathology processes  - Infectious markers numerically stable (afebrile, WBC wnl) - Perineal rash appears to be more fungal etiology   Name of physician (or Provider) Contacted: Dr. Dorinda  Current antimicrobials: vancomycin + fluconazole  Changes to prescribed antibiotics recommended:  - Recommended discontinuing vancomycin and observing off antibiotics given blood cultures currently growing 1/4 bottles of MRSE + lack of remarkable infectious markers (afebrile, normal WBC) - likely contaminant  - Agree to continue fluconazole given white lesions on tongue and perineal rash appears to be more fungal etiology  - Awaiting response from provider  Results for orders placed or performed during the hospital encounter of 06/02/24  Blood Culture ID Panel (Reflexed) (Collected: 06/02/2024 10:47 AM)  Result Value Ref Range   Enterococcus faecalis NOT DETECTED NOT DETECTED   Enterococcus Faecium NOT DETECTED NOT DETECTED   Listeria monocytogenes NOT DETECTED NOT DETECTED   Staphylococcus species DETECTED (A) NOT DETECTED   Staphylococcus aureus (BCID) NOT DETECTED NOT DETECTED   Staphylococcus epidermidis DETECTED (A) NOT DETECTED   Staphylococcus lugdunensis NOT DETECTED NOT DETECTED   Streptococcus species NOT DETECTED NOT DETECTED   Streptococcus agalactiae NOT DETECTED NOT DETECTED   Streptococcus pneumoniae NOT DETECTED NOT DETECTED   Streptococcus pyogenes NOT DETECTED NOT DETECTED   A.calcoaceticus-baumannii NOT DETECTED NOT DETECTED   Bacteroides  fragilis NOT DETECTED NOT DETECTED   Enterobacterales NOT DETECTED NOT DETECTED   Enterobacter cloacae complex NOT DETECTED NOT DETECTED   Escherichia coli NOT DETECTED NOT DETECTED   Klebsiella aerogenes NOT DETECTED NOT DETECTED   Klebsiella oxytoca NOT DETECTED NOT DETECTED   Klebsiella pneumoniae NOT DETECTED NOT DETECTED   Proteus species NOT DETECTED NOT DETECTED   Salmonella species NOT DETECTED NOT DETECTED   Serratia marcescens NOT DETECTED NOT DETECTED   Haemophilus influenzae NOT DETECTED NOT DETECTED   Neisseria meningitidis NOT DETECTED NOT DETECTED   Pseudomonas aeruginosa NOT DETECTED NOT DETECTED   Stenotrophomonas maltophilia NOT DETECTED NOT DETECTED   Candida albicans NOT DETECTED NOT DETECTED   Candida auris NOT DETECTED NOT DETECTED   Candida glabrata NOT DETECTED NOT DETECTED   Candida krusei NOT DETECTED NOT DETECTED   Candida parapsilosis NOT DETECTED NOT DETECTED   Candida tropicalis NOT DETECTED NOT DETECTED   Cryptococcus neoformans/gattii NOT DETECTED NOT DETECTED   Methicillin resistance mecA/C DETECTED (A) NOT DETECTED    Feliciano Close, PharmD PGY2 Infectious Diseases Pharmacy Resident  06/03/2024 12:00 PM

## 2024-06-03 NOTE — Progress Notes (Signed)
 Pharmacy Antibiotic Note  Alveta Quintela is a 43 y.o. female admitted on 06/02/2024 with N/V, possible bacteremia.  Pharmacy has been consulted for Vancomycin  dosing.  Plan: Vancomycin 1500 mg IV now, then 1000 mg IV q12h  Height: 5' 2 (157.5 cm) Weight: 87.9 kg (193 lb 12.6 oz) IBW/kg (Calculated) : 50.1  Temp (24hrs), Avg:98.5 F (36.9 C), Min:97.5 F (36.4 C), Max:99.5 F (37.5 C)  Recent Labs  Lab 06/02/24 1047 06/02/24 1300  WBC 4.8  --   CREATININE 0.65  --   LATICACIDVEN 2.0* 2.7*    Estimated Creatinine Clearance: 93.3 mL/min (by C-G formula based on SCr of 0.65 mg/dL).    Allergies  Allergen Reactions   Ibuprofen  Other (See Comments)    Gastric bypass surgery and history of GI bleed.   Lovenox   [Enoxaparin  Sodium] Hives   Dail Cordella Misty 06/03/2024 6:19 AM

## 2024-06-03 NOTE — Progress Notes (Signed)
 Made MD aware BP is 138/103. HR sustaining 130's. Not complaining of chest pain or being in any pain. Pt is now a MEWS 3. Rechecked vitals. BP 131/103 HR 125. Now MEWS 2. Charge also notified.

## 2024-06-03 NOTE — Progress Notes (Signed)
 Progress Note   Patient: Stacy [name] FMW:[id number] DOB: 06-06-1981 DOA: 06/02/2024     0 DOS: the patient was seen and examined on 06/03/2024   Brief hospital course: From HPI Stacy [name] is a 43 y.o. female with medical history  significant for prior h/o  gastric bypass , etoh abuse, obesity and hepatic steatosis, chronic cervical and lumbar disc disease with history of peripheral neuropathy, as well as history of prior PE, and ongoing tobacco abuse , and chronic anemia presenting with persistent/intractable emesis as well as  extensive rash in the perineum and groin area as well as scaly peeling lesions on the hands, tongue lesions/white patches suggestive of candidiasis and found to have elevated LFTs. - Current outpatient emesis has persisted for the last 4 to 5 days - Last EtOH intake was 05/31/2024--has been drinking about 2 bottles of wine each day prior to that    Assessment and Plan:  Intractable nausea and vomiting-improved - CTAP without acute findings, no fevers or leukocytosis, lipase WNL --Abdominal exam is benign -History of gastric bypass, history of cyclical vomiting in the past -Negative urine tox Continue as needed antiemetics Continue IV fluid   Alcohol abuse with mild DTs---history of DTs -Last EtOH intake was 05/31/2024--has been drinking about 2 bottles of wine each day prior to that  Continue CIWA protocol as ordered Continue folic acid  multivitamin and thiamine  as ordered  S/p post gastric bypass with chronic anemia and concerns for malabsorption and vitamin deficiencies -Recent workup shows:- - Ferritin is not low, serum iron is not low but TIBC is low - B12 is low normal, folate is low, vitamin D  is low Continue vitamin replacement Monitor CBC   Positive bacteremia  blood culture showing GPC 1 out of 4 bottles Discussed with ID pharmacist and thought to be due to contaminant Vancomycin discontinued We will continue to monitor  closely  Lactic acidosis Lactic acid 2.0 >>2.7  -- Suspect due to dehydration - No fevers or leukocytosis - CTAP without acute findings Continue hydration   Fungal infection---  patient with oral pharyngeal candidiasis -Patient with candidal intertrigo of the groin and perineum-please see photos in epic Follow-up on HIV test Continue with empiric treatment with IV Diflucan Monitor LFTs -Topical nystatin/triamcinolone (mycolog) cream to groin and perineum Continue on oral nystatin suspension swish and swallow as ordered   Alcoholic Transaminitis/hepatic steatosis --improving LFTs  AST to  ALT ratio is greater than  to 2 :1 -consistent with alcoholic hepatitis Counseled on alcohol cessation   Acute thrombocytopenia  Likely related to alcohol use Monitor CBC   Mild hyponatremia/hypochloremia--due to dehydration Continue IV hydration  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    Advance Care Planning:   Code Status: Full Code    Family Communication: None available at bedside   Subjective:  Patient seen and examined at bedside this morning Admits to improvement in nausea and vomiting She tells me her rash is improving Denied chest pain cough abdominal pain  Physical Exam: Gen:- Awake Alert, in no acute distress  HEENT:- Stacy [name].AT, No sclera icterus Mouth--edentulous, white patchy lesions on tongue, palate, buccal mucosa Neck-Supple Neck,No JVD,.  Lungs-  CTAB , fair air movement bilaterally  CV- S1, S2 normal, RRR Abd-  +ve B.Sounds, Abd Soft, No tenderness,    Extremity/Skin:- No  edema, findings as shown in image below Psych-affect is appropriate, oriented x3  Neuro-generalized weakness, no new focal deficits  Media Information   Document Information .picture  Vitals:   06/02/24 1957 06/03/24 0437 06/03/24 0750 06/03/24 0900  BP:  120/87 (!) 117/95 (!) 135/99   Pulse: (!) 108 99 (!) 111 (!) 105  Resp: 16 16 18    Temp: 99.5 F (37.5 C) 98.5 F (36.9 C) 98.4 F (36.9 C)   TempSrc: Oral Oral Oral   SpO2: 98% 100% 99%   Weight:      Height:        Data Reviewed:     Latest Ref Rng & Units 06/02/2024   10:47 AM 04/22/2024    1:01 PM 04/01/2024    4:38 AM  CBC  WBC 4.0 - 10.5 K/uL 4.8  5.1  6.3   Hemoglobin 12.0 - 15.0 g/dL 86.5  87.5  9.1   Hematocrit 36.0 - 46.0 % 40.4  37.9  27.3   Platelets 150 - 400 K/uL 118  235  251        Latest Ref Rng & Units 06/03/2024    3:53 AM 06/02/2024   10:47 AM 04/22/2024    1:01 PM  BMP  Glucose 70 - 99 mg/dL 87  96  81   BUN 6 - 20 mg/dL 7  8  7    Creatinine 0.44 - 1.00 mg/dL 9.34  9.34  9.44   Sodium 135 - 145 mmol/L 134  133  136   Potassium 3.5 - 5.1 mmol/L 3.7  4.1  4.0   Chloride 98 - 111 mmol/L 101  91  96   CO2 22 - 32 mmol/L 22  27  24    Calcium 8.9 - 10.3 mg/dL 8.4  89.5  9.2       Author: Drue ONEIDA Potter, MD 06/03/2024 2:54 PM  For on call review [email or web address].

## 2024-06-03 NOTE — Progress Notes (Deleted)
 Progress Note   Patient: Stacy [name] FMW:[id number] DOB: 20-Jun-1981 DOA: 06/02/2024     0 DOS: the patient was seen and examined on 06/03/2024   Brief hospital course: From HPI Ajah Nayana Lenig is a 43 y.o. female with medical history  significant for prior h/o  gastric bypass , etoh abuse, obesity and hepatic steatosis, chronic cervical and lumbar disc disease with history of peripheral neuropathy, as well as history of prior PE, and ongoing tobacco abuse , and chronic anemia presenting with persistent/intractable emesis as well as  extensive rash in the perineum and groin area as well as scaly peeling lesions on the hands, tongue lesions/white patches suggestive of candidiasis and found to have elevated LFTs - Current outpatient emesis has persisted for the last 4 to 5 days  Assessment and Plan: Intractable nausea vomiting CTAP without acute findings, no fevers or leukocytosis, lipase WNL History of gastric bypass, history of cyclical vomiting in the past -Negative urine tox Continue as needed antiemetics Continue IV fluid   Alcohol abuse with mild Dts Patient with a history of DTs in the past -Last EtOH intake was 05/31/2024--has been drinking about 2 bottles of wine each day prior to that  Continue CIWA protocol Continue folic acid  and thiamine    Status post gastric bypass with chronic anemia and concerns for malabsorption and vitamin deficiencies -Recent workup shows:- - Ferritin is not low, serum iron is not low but TIBC is low - B12 is low normal, folate is low, vitamin D  is low Continue vitamin replacement    Lactic acidosis  lactic acid 2.0 >>2.7  -- Suspect due to dehydration Continue IV hydration   Fungal infection in the perineal region as well as oral cavity  patient with oral pharyngeal candidiasis -Patient with candidal intertrigo of the groin and follow-up on HIV status -check HIV status Continue empirically therapy with IV  Diflucan -Topical nystatin/triamcinolone (mycolog) cream to groin and perineum --Oral nystatin suspension swish and swallow as ordered   Acute transaminitis  Likely secondary to alcohol abuse counseled on alcohol cessation   Thrombocytopenia in the setting of alcohol use Monitor platelets closely   Mild hyponatremia due to dehydration Continue IV hydration   Class II obesity -Body mass index is 35.44 kg/m. This complicates overall care   Advance Care Planning:   Code Status: Full Code    Family Communication: None available at bedside     Subjective:  Seen and examined at bedside this morning she admits to improvement in nausea and vomiting Denies abdominal pain chest pain or cough Also admits to improvement in the rash in the groin  Physical Exam:  Gen:- Awake Alert, in no acute distress  HEENT:- [place].AT, No sclera icterus Mouth--edentulous, white patchy lesions on tongue, palate, buccal mucosa Neck-Supple Neck,No JVD,.  Lungs-  CTAB , fair air movement bilaterally  CV- S1, S2 normal, RRR Abd-  +ve B.Sounds, Abd Soft, No tenderness,    Extremity/Skin:- No  edema,   good pedal pulses Significant excoriation with peeling noted Psych-affect is appropriate, oriented x3 Neuro-generalized weakness, no new focal deficits, mild tremors Vitals:   06/02/24 1957 06/03/24 0437 06/03/24 0750 06/03/24 0900  BP: 120/87 (!) 117/95 (!) 135/99   Pulse: (!) 108 99 (!) 111 (!) 105  Resp: 16 16 18    Temp: 99.5 F (37.5 C) 98.5 F (36.9 C) 98.4 F (36.9 C)   TempSrc: Oral Oral Oral   SpO2: 98% 100% 99%   Weight:      Height:  Data Reviewed: CBC, BMP reviewed Chest x-ray reviewed showed no cardiopulmonary process  Author: Drue ONEIDA Potter, MD 06/03/2024 11:17 AM  For on call review [email or web address].

## 2024-06-04 ENCOUNTER — Other Ambulatory Visit (HOSPITAL_COMMUNITY): Payer: Self-pay

## 2024-06-04 DIAGNOSIS — R112 Nausea with vomiting, unspecified: Secondary | ICD-10-CM | POA: Diagnosis not present

## 2024-06-04 DIAGNOSIS — R21 Rash and other nonspecific skin eruption: Secondary | ICD-10-CM

## 2024-06-04 DIAGNOSIS — R1115 Cyclical vomiting syndrome unrelated to migraine: Secondary | ICD-10-CM | POA: Diagnosis not present

## 2024-06-04 MED ORDER — FLUCONAZOLE 100 MG PO TABS
100.0000 mg | ORAL_TABLET | Freq: Every day | ORAL | 0 refills | Status: AC
  Filled 2024-06-04: qty 2, 2d supply, fill #0

## 2024-06-04 NOTE — Plan of Care (Signed)

## 2024-06-04 NOTE — Hospital Course (Signed)
 43 y.o. female with medical history  significant for prior h/o  gastric bypass , etoh abuse, obesity and hepatic steatosis, chronic cervical and lumbar disc disease with history of peripheral neuropathy, as well as history of prior PE, and ongoing tobacco abuse , and chronic anemia presenting with persistent/intractable emesis as well as  extensive rash in the perineum and groin area as well as scaly peeling lesions on the hands, tongue lesions/white patches suggestive of candidiasis and found to have elevated LFTs. - Current outpatient emesis has persisted for the last 4 to 5 days - Last EtOH intake was 05/31/2024--has been drinking about 2 bottles of wine each day prior to that

## 2024-06-04 NOTE — Discharge Summary (Signed)
 Physician Discharge Summary   Patient: Stacy [name] MRN: [id number] DOB: 1981/04/08  Admit date:     06/02/2024  Discharge date: 06/04/24  Discharge Physician: Garnette Pelt   PCP: [facility]   Recommendations at discharge:    Follow up with PCP in 1-2 weeks Please follow up on rapid HIV test  Discharge Diagnoses: Principal Problem:   Emesis, persistent Active Problems:   Folate deficiency   Alcohol abuse   Hepatic steatosis   Alcohol dependence with uncomplicated withdrawal (HCC)  Resolved Problems:   * No resolved hospital problems. *  Hospital Course: 43 y.o. female with medical history  significant for prior h/o  gastric bypass , etoh abuse, obesity and hepatic steatosis, chronic cervical and lumbar disc disease with history of peripheral neuropathy, as well as history of prior PE, and ongoing tobacco abuse , and chronic anemia presenting with persistent/intractable emesis as well as  extensive rash in the perineum and groin area as well as scaly peeling lesions on the hands, tongue lesions/white patches suggestive of candidiasis and found to have elevated LFTs. - Current outpatient emesis has persisted for the last 4 to 5 days - Last EtOH intake was 05/31/2024--has been drinking about 2 bottles of wine each day prior to that  Assessment and Plan: Intractable nausea and vomiting-improved - CTAP without acute findings, no fevers or leukocytosis, lipase WNL --Abdominal exam is benign -History of gastric bypass, history of cyclical vomiting in the past -Negative urine tox Given as needed antiemetics and IVF Tolerated regular diet by day of d/c   Alcohol abuse with mild DTs---history of DTs -Last EtOH intake was 05/31/2024--has been drinking about 2 bottles of wine each day prior to that  Continued CIWA protocol while in hospital   S/p post gastric bypass with chronic anemia and concerns for malabsorption and vitamin deficiencies -Recent workup  shows:- - Ferritin is not low, serum iron is not low but TIBC is low - B12 is low normal, folate is low, vitamin D  is low Continue vitamin replacement   Positive bacteremia  blood culture showing GPC 1 out of 4 bottles Dr. Dorinda discussed with ID pharmacist and thought to be due to contaminant Vancomycin discontinued   Lactic acidosis Lactic acid 2.0 >>2.7  -- Suspect due to dehydration - No fevers or leukocytosis - CTAP without acute findings -given hydration. 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       Consultants:  Procedures performed:   Disposition: Home Diet recommendation:  Regular diet DISCHARGE MEDICATION: Allergies as of 06/04/2024       Reactions   Ibuprofen  Other (See Comments)   Gastric bypass surgery and history of GI bleed.   Lovenox   [enoxaparin  Sodium] Hives        Medication List     TAKE these medications    ferrous sulfate  325 (65 FE) MG tablet  Take 1 tablet (325 mg total) by mouth daily with breakfast.   fluconazole 100 MG tablet Commonly known as: Diflucan Take 1 tablet (100 mg total) by mouth daily for 2 days.   folic acid  1 MG tablet Commonly known as: FOLVITE  Take 1 tablet (1 mg total) by mouth daily.   gabapentin  300 MG capsule Commonly  known as: Neurontin  Take 600 mg by mouth qam,  300 mg midday,  then 600 mg at bedtime What changed:  how much to take how to take this when to take this additional instructions   Vitamin D  (Ergocalciferol ) 1.25 MG (50000 UNIT) Caps capsule Commonly known as: DRISDOL  Take 50,000 Units by mouth once a week.        Follow-up Information     [facility] Follow up in 2 week(s).   Why: Hospital follow up Contact information: [address] [address] [address] [phone]                Discharge Exam: Stacy [name]   06/02/24 0955  Weight: 87.9 kg   General exam: Awake, laying in bed, in nad Respiratory system: Normal respiratory effort, no wheezing Cardiovascular system: regular rate, s1, s2 Gastrointestinal system: Soft, nondistended, positive BS Central nervous system: CN2-12 grossly intact, strength intact Extremities: Perfused, no clubbing Skin: Normal skin turgor, no notable skin lesions seen Psychiatry: Mood normal // no visual hallucinations   Condition at discharge: fair  The results of significant diagnostics from this hospitalization (including imaging, microbiology, ancillary and laboratory) are listed below for reference.   Imaging Studies: CT ABDOMEN PELVIS W CONTRAST Result Date: 06/02/2024 EXAM: CT ABDOMEN AND PELVIS WITH CONTRAST 06/02/2024 12:23:01 PM TECHNIQUE: CT of the abdomen and pelvis was performed with the administration of 100 mL of iohexol  (OMNIPAQUE ) 300 MG/ML solution. Multiplanar reformatted images are provided for review. Automated exposure control, iterative reconstruction, and/or weight-based adjustment of the mA/kV was utilized to reduce the radiation dose to as low as reasonably achievable. COMPARISON: None available. CLINICAL HISTORY: Sepsis; groin/perineum erythema, concern for [place]. Pt arrived via pOV c/o N/V and loss of appetite since this past weekend. Pt presents tachycardic and hypertensive in Triage as well and  endorses pain in her hands. FINDINGS: LOWER CHEST: No acute abnormality. LIVER: Steatosis. GALLBLADDER AND BILE DUCTS: Minimal cholelithiasis. No biliary ductal dilatation. SPLEEN: No acute abnormality. PANCREAS: No acute abnormality. ADRENAL GLANDS: No acute abnormality. KIDNEYS, URETERS AND BLADDER: No stones in the kidneys or ureters. No hydronephrosis. No perinephric or periureteral stranding. Urinary bladder is unremarkable. GI AND BOWEL: Status post gastric bypass. Stomach demonstrates no acute abnormality. There is no bowel obstruction. PERITONEUM AND RETROPERITONEUM: No ascites. No free air. VASCULATURE: Aorta is normal in caliber. LYMPH NODES: No lymphadenopathy. REPRODUCTIVE ORGANS: No acute abnormality. BONES AND SOFT TISSUES: No acute osseous abnormality. No focal soft tissue abnormality. IMPRESSION: 1. No acute intra-abdominal or pelvic pathology. 2. Cholelithiasis. 3. Hepatic steatosis. 4. Postoperative changes of gastric bypass. Electronically signed by: Lynwood Seip MD 06/02/2024 12:46 PM EDT RP Workstation: [id number]   DG Chest Port 1 View if patient is in a treatment room. Result Date: 06/02/2024 EXAM: 1 VIEW(S) XRAY OF THE CHEST 06/02/2024 11:51:00 AM COMPARISON: 08/31/2020 CLINICAL HISTORY: Suspected Sepsis. Pt arrived via pOV c/o N/V and loss of appetite since this past weekend. Pt presents tachycardic and hypertensive in Triage as well and endorses pain in her hands. Hx of PE. Current some day smoker. FINDINGS: LUNGS AND PLEURA: No focal pulmonary opacity. No pulmonary edema.  No pleural effusion. No pneumothorax. HEART AND MEDIASTINUM: No acute abnormality of the cardiac and mediastinal silhouettes. BONES AND SOFT TISSUES: No acute osseous abnormality. IMPRESSION: 1. No acute cardiopulmonary disease. Electronically signed by: Lynwood Seip MD 06/02/2024 12:04 PM EDT RP Workstation: [id number]    Microbiology: Results for orders placed or performed during the hospital encounter of 06/02/24   Culture, blood (Routine x 2)     Status: None (Preliminary result)   Collection Time: 06/02/24 10:28 AM   Specimen: Left Antecubital; Blood  Result Value Ref Range Status   Specimen Description   Final    LEFT ANTECUBITAL BOTTLES DRAWN AEROBIC AND ANAEROBIC   Special Requests   Final    Blood Culture results may not be optimal due to an inadequate volume of blood received in culture bottles   Culture   Final    NO GROWTH 2 DAYS Performed at [facility], [address], [place], [place] [address]    Report Status PENDING  Incomplete  Group A Strep by PCR     Status: None   Collection Time: 06/02/24 10:44 AM   Specimen: Throat; Sterile Swab  Result Value Ref Range Status   Group A Strep by PCR NOT DETECTED NOT DETECTED Final    Comment: Performed at [facility], [address] [place], [place] [address]  Culture, blood (Routine x 2)     Status: Abnormal (Preliminary result)   Collection Time: 06/02/24 10:47 AM   Specimen: Right Antecubital; Blood  Result Value Ref Range Status   Specimen Description   Final    RIGHT ANTECUBITAL BOTTLES DRAWN AEROBIC AND ANAEROBIC Performed at [facility], [address], [place], [place] [address]    Special Requests   Final    Blood Culture adequate volume Performed at [facility], [address] [place], [place] [address]    Culture  Setup Time   Final    GRAM POSITIVE COCCI ANAEROBIC BOTTLE ONLY Gram Stain Report Called to,Read Back By and Verified With: A RI,RN@0512  06/03/24 MK CRITICAL RESULT CALLED TO, READ BACK BY AND VERIFIED WITH: PHARMD B WANNARAT [id number] AT 1054 BY CM    Culture (A)  Final    STAPHYLOCOCCUS EPIDERMIDIS THE SIGNIFICANCE OF ISOLATING THIS ORGANISM FROM A SINGLE SET OF BLOOD CULTURES WHEN MULTIPLE SETS ARE DRAWN IS UNCERTAIN. PLEASE NOTIFY THE MICROBIOLOGY DEPARTMENT WITHIN ONE WEEK IF SPECIATION AND SENSITIVITIES ARE REQUIRED. Performed at [facility], [address], [place], [place] [address]    Report  Status PENDING  Incomplete  Blood Culture ID Panel (Reflexed)     Status: Abnormal   Collection Time: 06/02/24 10:47 AM  Result Value Ref Range Status   Enterococcus faecalis NOT DETECTED NOT DETECTED Final   Enterococcus Faecium NOT DETECTED NOT DETECTED Final   Listeria monocytogenes NOT DETECTED NOT DETECTED Final   Staphylococcus species DETECTED (A) NOT DETECTED Final    Comment: CRITICAL RESULT CALLED TO, READ BACK BY AND VERIFIED WITH: PHARMD B WANNARAT [id number] AT 1055 AM BY CM    Staphylococcus aureus (BCID) NOT DETECTED NOT DETECTED Final   Staphylococcus epidermidis DETECTED (A) NOT DETECTED Final    Comment: Methicillin (oxacillin) resistant coagulase negative staphylococcus. Possible blood culture contaminant (unless isolated from more than one blood culture draw or clinical case suggests pathogenicity). No antibiotic treatment is indicated for blood  culture contaminants. CRITICAL RESULT CALLED TO, READ BACK BY AND VERIFIED WITH: PHARMD B WANNARAT [id number] AT 1055 BY CM    Staphylococcus lugdunensis  NOT DETECTED NOT DETECTED Final   Streptococcus species NOT DETECTED NOT DETECTED Final   Streptococcus agalactiae NOT DETECTED NOT DETECTED Final   Streptococcus pneumoniae NOT DETECTED NOT DETECTED Final   Streptococcus pyogenes NOT DETECTED NOT DETECTED Final   A.calcoaceticus-baumannii NOT DETECTED NOT DETECTED Final   Bacteroides fragilis NOT DETECTED NOT DETECTED Final   Enterobacterales NOT DETECTED NOT DETECTED Final   Enterobacter cloacae complex NOT DETECTED NOT DETECTED Final   Escherichia coli NOT DETECTED NOT DETECTED Final   Klebsiella aerogenes NOT DETECTED NOT DETECTED Final   Klebsiella oxytoca NOT DETECTED NOT DETECTED Final   Klebsiella pneumoniae NOT DETECTED NOT DETECTED Final   Proteus species NOT DETECTED NOT DETECTED Final   Salmonella species NOT DETECTED NOT DETECTED Final   Serratia marcescens NOT DETECTED NOT DETECTED Final   Haemophilus influenzae  NOT DETECTED NOT DETECTED Final   Neisseria meningitidis NOT DETECTED NOT DETECTED Final   Pseudomonas aeruginosa NOT DETECTED NOT DETECTED Final   Stenotrophomonas maltophilia NOT DETECTED NOT DETECTED Final   Candida albicans NOT DETECTED NOT DETECTED Final   Candida auris NOT DETECTED NOT DETECTED Final   Candida glabrata NOT DETECTED NOT DETECTED Final   Candida krusei NOT DETECTED NOT DETECTED Final   Candida parapsilosis NOT DETECTED NOT DETECTED Final   Candida tropicalis NOT DETECTED NOT DETECTED Final   Cryptococcus neoformans/gattii NOT DETECTED NOT DETECTED Final   Methicillin resistance mecA/C DETECTED (A) NOT DETECTED Final    Comment: CRITICAL RESULT CALLED TO, READ BACK BY AND VERIFIED WITH: PHARMD B WANNARAT [id number] AT 1055 BY CM Performed at [facility], [address] [place], [place] [address]     Labs: CBC: Recent Labs  Lab 06/02/24 1047  WBC 4.8  NEUTROABS 3.8  HGB 13.4  HCT 40.4  MCV 92.9  PLT 118*   Basic Metabolic Panel: Recent Labs  Lab 06/02/24 1047 06/03/24 0353  NA 133* 134*  K 4.1 3.7  CL 91* 101  CO2 27 22  GLUCOSE 96 87  BUN 8 7  CREATININE 0.65 0.65  CALCIUM 10.4* 8.4*   Liver Function Tests: Recent Labs  Lab 06/02/24 1047 06/03/24 0353  AST 373* 175*  ALT 129* 75*  ALKPHOS 190* 114  BILITOT 2.7* 1.7*  PROT 8.0 5.3*  ALBUMIN 4.1 2.3*   CBG: No results for input(s): GLUCAP in the last 168 hours.  Discharge time spent: less than 30 minutes.  Signed: Garnette Pelt, MD [facility] 06/04/2024

## 2024-06-04 NOTE — Progress Notes (Signed)
 Discharge  AVS and discharge education completed. [name]  PIV removed gauze dressing intact.  Patent given time to ask questions.  Patient  verbalized understanding of discharge instruction and plan of care.  TOC Meds at bedside.    Daughter on the way.   Discharge Lounge notified.

## 2024-06-04 NOTE — TOC CAGE-AID Note (Signed)
 Transition of Care [facility]) - CAGE-AID Screening   Patient Details  Name: Stacy [name] MRN: [id number] Date of Birth: July 16, 1981  Transition of Care [facility]) CM/SW Contact:    [name], RN Phone Number: 06/04/2024, 2:43 PM   Clinical Narrative:  Attempted to see 3 times. Resources added to AVS.  CAGE-AID Screening:

## 2024-06-04 NOTE — TOC Transition Note (Signed)
 Transition of Care [facility]) - Discharge Note   Patient Details  Name: Stacy [name] MRN: [id number] Date of Birth: 07-15-1981  Transition of Care [facility]) CM/SW Contact:  [name], RN Phone Number: 06/04/2024, 2:42 PM   Clinical Narrative:     Pt is discharging home with self care. CM attempted to see 3 times for SA resources and she was not available. Resources added to AVS.  Pt has transportation home.  Final next level of care: Home/Self Care Barriers to Discharge: No Barriers Identified   Patient Goals and CMS Choice            Discharge Placement                       Discharge Plan and Services Additional resources added to the After Visit Summary for                                       Social Drivers of Health (SDOH) Interventions SDOH Screenings   Food Insecurity: No Food Insecurity (06/03/2024)  Housing: Low Risk  (06/03/2024)  Transportation Needs: No Transportation Needs (06/03/2024)  Utilities: Not At Risk (06/03/2024)  Financial Resource Strain: Low Risk  (06/02/2019)  Physical Activity: Insufficiently Active (06/02/2019)  Social Connections: Unknown (01/01/2022)   Received from [facility]  Stress: No Stress Concern Present (06/02/2019)  Tobacco Use: High Risk (06/02/2024)     Readmission Risk Interventions    04/01/2024   10:07 AM  Readmission Risk Prevention Plan  Post Dischage Appt Complete  Medication Screening Complete  Transportation Screening Complete

## 2024-06-05 LAB — CULTURE, BLOOD (ROUTINE X 2): Special Requests: ADEQUATE

## 2024-06-05 LAB — HIV-1/HIV-2 QUAL RNA
HIV-1 RNA, Qualitative: NONREACTIVE
HIV-2 RNA, Qualitative: NONREACTIVE

## 2024-06-07 LAB — CULTURE, BLOOD (ROUTINE X 2): Culture: NO GROWTH
